# Patient Record
Sex: Female | Born: 1980 | Race: Black or African American | Hispanic: No | Smoking: Never smoker
Health system: Southern US, Community
[De-identification: ages and names within clinical notes are randomized; demographics above are authoritative.]

## PROBLEM LIST (undated history)

## (undated) DIAGNOSIS — R011 Cardiac murmur, unspecified: Secondary | ICD-10-CM

## (undated) DIAGNOSIS — Z87898 Personal history of other specified conditions: Secondary | ICD-10-CM

## (undated) DIAGNOSIS — R51 Headache: Secondary | ICD-10-CM

## (undated) DIAGNOSIS — D649 Anemia, unspecified: Secondary | ICD-10-CM

## (undated) DIAGNOSIS — Z8639 Personal history of other endocrine, nutritional and metabolic disease: Secondary | ICD-10-CM

## (undated) DIAGNOSIS — R7303 Prediabetes: Secondary | ICD-10-CM

## (undated) DIAGNOSIS — Z8709 Personal history of other diseases of the respiratory system: Secondary | ICD-10-CM

## (undated) DIAGNOSIS — E0500 Thyrotoxicosis with diffuse goiter without thyrotoxic crisis or storm: Secondary | ICD-10-CM

## (undated) HISTORY — DX: Prediabetes: R73.03

## (undated) HISTORY — PX: NO PAST SURGERIES: SHX2092

---

## 1998-02-28 ENCOUNTER — Other Ambulatory Visit: Admission: RE | Admit: 1998-02-28 | Discharge: 1998-02-28 | Payer: Self-pay | Admitting: Obstetrics

## 2000-08-13 ENCOUNTER — Emergency Department (HOSPITAL_COMMUNITY): Admission: EM | Admit: 2000-08-13 | Discharge: 2000-08-13 | Payer: Self-pay | Admitting: Emergency Medicine

## 2000-08-13 ENCOUNTER — Encounter: Payer: Self-pay | Admitting: Emergency Medicine

## 2007-09-27 ENCOUNTER — Inpatient Hospital Stay (HOSPITAL_COMMUNITY): Admission: AD | Admit: 2007-09-27 | Discharge: 2007-09-29 | Payer: Self-pay | Admitting: Obstetrics and Gynecology

## 2010-05-31 HISTORY — PX: WISDOM TOOTH EXTRACTION: SHX21

## 2011-02-23 LAB — CBC
HCT: 35.1 — ABNORMAL LOW
HCT: 37.2
Hemoglobin: 11.6 — ABNORMAL LOW
Hemoglobin: 12.1
MCHC: 32.5
MCHC: 33
MCV: 84.1
MCV: 84.1
Platelets: 215
Platelets: 228
RBC: 4.18
RBC: 4.43
RDW: 12.7
RDW: 13.2
WBC: 11.9 — ABNORMAL HIGH
WBC: 17.8 — ABNORMAL HIGH

## 2011-02-23 LAB — RPR: RPR Ser Ql: NONREACTIVE

## 2013-08-09 ENCOUNTER — Encounter (HOSPITAL_COMMUNITY): Payer: Self-pay | Admitting: Emergency Medicine

## 2013-08-09 ENCOUNTER — Emergency Department (HOSPITAL_COMMUNITY)
Admission: EM | Admit: 2013-08-09 | Discharge: 2013-08-09 | Disposition: A | Discharge status: Home / Self Care | Payer: Federal, State, Local not specified - PPO | Source: Home / Self Care | Attending: Family Medicine | Admitting: Family Medicine

## 2013-08-09 DIAGNOSIS — J40 Bronchitis, not specified as acute or chronic: Secondary | ICD-10-CM

## 2013-08-09 HISTORY — DX: Thyrotoxicosis with diffuse goiter without thyrotoxic crisis or storm: E05.00

## 2013-08-09 MED ORDER — AZITHROMYCIN 250 MG PO TABS: 250.0000 mg | ORAL_TABLET | Freq: Every day | ORAL | Status: DC

## 2013-08-09 NOTE — Discharge Instructions (Signed)

## 2013-08-09 NOTE — ED Provider Notes (Signed)
CSN: [id number]     Arrival date & time 08/09/13  1919 History   First MD Initiated Contact with Patient 08/09/13 1943     No chief complaint on file.  (Consider location/radiation/quality/duration/timing/severity/associated sxs/prior Treatment) Patient is a 33 y.o. female presenting with cough. The history is provided by the patient. No language interpreter was used.  Cough Cough characteristics:  Productive Sputum characteristics:  Green Severity:  Moderate Onset quality:  Gradual Duration:  10 days Timing:  Constant Progression:  Worsening Chronicity:  New Smoker: no   Relieved by:  Nothing Worsened by:  Nothing tried Associated symptoms: no fever     No past medical history on file. No past surgical history on file. No family history on file. History  Substance Use Topics  . Smoking status: Not on file  . Smokeless tobacco: Not on file  . Alcohol Use: Not on file   OB History   No data available     Review of Systems  Constitutional: Negative for fever.  Respiratory: Positive for cough.   All other systems reviewed and are negative.    Allergies  Review of patient's allergies indicates not on file.  Home Medications  No current outpatient prescriptions on file. BP 105/71  Pulse 96  Temp(Src) 98.4 F (36.9 C) (Oral)  Resp 18  SpO2 96% Physical Exam  Nursing note and vitals reviewed. Constitutional: She appears well-developed and well-nourished.  HENT:  Head: Normocephalic.  Right Ear: External ear normal.  Left Ear: External ear normal.  Nose: Nose normal.  Eyes: Pupils are equal, round, and reactive to light.  Neck: Normal range of motion. Neck supple.  Cardiovascular: Normal rate and normal heart sounds.   Pulmonary/Chest: Effort normal and breath sounds normal.  Abdominal: Soft.  Musculoskeletal: Normal range of motion.  Skin: Skin is warm.  Psychiatric: She has a normal mood and affect.    ED Course  Procedures (including critical care  time) Labs Review Labs Reviewed - No data to display Imaging Review No results found.   MDM   1. Bronchitis    zithromax    Fransico Meadow, PA-C 08/09/13 2000

## 2013-08-09 NOTE — ED Provider Notes (Signed)
Medical screening examination/treatment/procedure(s) were performed by resident physician or non-physician practitioner and as supervising physician I was immediately available for consultation/collaboration.   KINDL,JAMES DOUGLAS MD.   James D Kindl, MD 08/09/13 2047 

## 2013-08-09 NOTE — ED Notes (Signed)
Had aching all over, chills and low grade fever onset last Friday.  She vomited x 2 last week and twice today and once on Tuesday.  No diarrhea.  Cough continuous for 1 1/2 weeks.  Tried Robitussin and Mucinex and now [employer]

## 2013-09-17 ENCOUNTER — Ambulatory Visit (INDEPENDENT_AMBULATORY_CARE_PROVIDER_SITE_OTHER): Payer: Self-pay | Admitting: Surgery

## 2013-10-02 ENCOUNTER — Encounter (INDEPENDENT_AMBULATORY_CARE_PROVIDER_SITE_OTHER): Payer: Self-pay | Admitting: Surgery

## 2013-10-02 ENCOUNTER — Ambulatory Visit (INDEPENDENT_AMBULATORY_CARE_PROVIDER_SITE_OTHER): Payer: Federal, State, Local not specified - PPO | Admitting: Surgery

## 2013-10-02 VITALS — BP 130/78 | HR 75 | Temp 98.3°F | Resp 14 | Ht 66.0 in | Wt 157.0 lb

## 2013-10-02 DIAGNOSIS — E0590 Thyrotoxicosis, unspecified without thyrotoxic crisis or storm: Secondary | ICD-10-CM

## 2013-10-02 NOTE — Progress Notes (Signed)
[facility]  Chief Complaint  Patient presents with  . New Evaluation    hyperthyroidism - referral from Dr. Jacelyn Pi    HISTORY: The patient is a 33 year old female referred by her endocrinologist for hyperthyroidism. Patient was initially diagnosed with Graves' disease during her fourth pregnancy. She was treated with propylthiouracil. Patient was off medication for a time with borderline TSH levels. Recent TSH level however was markedly suppressed at 0.04. Patient is now taking methimazole.  Patient is has had symptoms of tremor, night sweats, and double vision. Other than laboratory studies, she has had no imaging studies performed.  Patient has discussed options for management with her endocrinologist. This includes treatment with radioactive iodine versus having total thyroidectomy. Patient is interested in the surgical option.  Patient has no prior history of thyroid disease. There is a family history of hypothyroidism in the patient's maternal grandmother. There is no history of thyroid malignancy. There is no family history of other endocrine neoplasms.  Past Medical History  Diagnosis Date  . Graves disease     Hyper thyroid    Current Outpatient Prescriptions  Medication Sig Dispense Refill  . methimazole (TAPAZOLE) 10 MG tablet Take 10 mg by mouth 3 (three) times daily.       No current facility-administered medications for this visit.    No Known Allergies  Family History  Problem Relation Age of Onset  . Cancer Mother     Peripheral T cell Lymphoma  . Diabetes Father     History   Social History  . Marital Status: Married    Spouse Name: N/A    Number of Children: N/A  . Years of Education: N/A   Social History Main Topics  . Smoking status: Never Smoker   . Smokeless tobacco: None  . Alcohol Use: No  . Drug Use: No  . Sexual Activity: Yes    Birth Control/ Protection: IUD   Other Topics Concern  . None    Social History Narrative  . None    REVIEW OF SYSTEMS - PERTINENT POSITIVES ONLY: Positive for tremor. Positive for double vision. Positive for night sweats. Denies compressive symptoms. Denies palpable masses. Denies pain. Denies palpitations.  EXAM: Filed Vitals:   10/02/13 1338  BP: 130/78  Pulse: 75  Temp: 98.3 F (36.8 C)  Resp: 14    GENERAL: well-developed, well-nourished, no acute distress HEENT: normocephalic; pupils equal and reactive; sclerae clear; dentition good; mucous membranes moist NECK:  No palpable masses in the thyroid bed; symmetric on extension; no palpable anterior or posterior cervical lymphadenopathy; no supraclavicular masses; no tenderness CHEST: clear to auscultation bilaterally without rales, rhonchi, or wheezes CARDIAC: regular rate and rhythm without significant murmur; peripheral pulses are full EXT:  non-tender without edema; no deformity NEURO: no gross focal deficits; no sign of tremor   LABORATORY RESULTS: See [facility] (CHL-Epic) for most recent results  RADIOLOGY RESULTS: See [facility] (CHL-Epic) for most recent results  IMPRESSION: Graves' disease (hyperthyroidism)  PLAN: I discussed the above findings at length with the patient. We reviewed records from her endocrinologist. We discussed management of hyperthyroidism and her options for treatment between radioactive iodine and total thyroidectomy. I provided her with written literature regarding thyroid surgery. We discussed total thyroidectomy including the risk and benefits including the risk of bleeding, infection, recurrent laryngeal nerve injury, and injury to parathyroid glands. We discussed the hospital stay to be anticipated. We discussed the postoperative recovery and return to  work. We discussed the need for lifelong thyroid hormone replacement. Patient understands and wishes to proceed.  I would like to obtain a thyroid ultrasound preoperatively. We will also make  arrangements for assessment by anesthesiology.  The risks and benefits of the procedure have been discussed at length with the patient.  The patient understands the proposed procedure, potential alternative treatments, and the course of recovery to be expected.  All of the patient's questions have been answered at this time.  The patient wishes to proceed with surgery.  Earnstine Regal, MD, [facility]  Primary Care Physician: Antony Blackbird, MD

## 2013-10-02 NOTE — Patient Instructions (Signed)

## 2013-10-03 ENCOUNTER — Telehealth (INDEPENDENT_AMBULATORY_CARE_PROVIDER_SITE_OTHER): Payer: Self-pay | Admitting: *Deleted

## 2013-10-03 NOTE — Telephone Encounter (Signed)
LM for pt to return my call.  Please inform pt of her US Soft Tissue Head/Neck appt @ [place], [facility], 5.8.15 @ 1:45.    Anderson Malta

## 2013-10-05 ENCOUNTER — Encounter (INDEPENDENT_AMBULATORY_CARE_PROVIDER_SITE_OTHER): Payer: Self-pay

## 2013-10-05 ENCOUNTER — Other Ambulatory Visit: Payer: Federal, State, Local not specified - PPO

## 2013-10-05 ENCOUNTER — Ambulatory Visit
Admission: RE | Admit: 2013-10-05 | Discharge: 2013-10-05 | Disposition: A | Discharge status: Home / Self Care | Payer: Federal, State, Local not specified - PPO | Source: Ambulatory Visit | Attending: Surgery | Admitting: Surgery

## 2013-10-05 DIAGNOSIS — E0590 Thyrotoxicosis, unspecified without thyrotoxic crisis or storm: Secondary | ICD-10-CM

## 2013-10-05 IMAGING — US US SOFT TISSUE HEAD/NECK
1 series · 14 of 25 positions shown · non-contrast
Comparison: None.

CLINICAL DATA: Hyperthyroid.  Graves disease.

EXAM:
THYROID ULTRASOUND
TECHNIQUE: Ultrasound examination of the thyroid gland and adjacent soft
tissues was performed.

[Series 1: us soft tissue head/neck · 0.08mm/px · 14 of 56 slices shown]
[im 1/56]
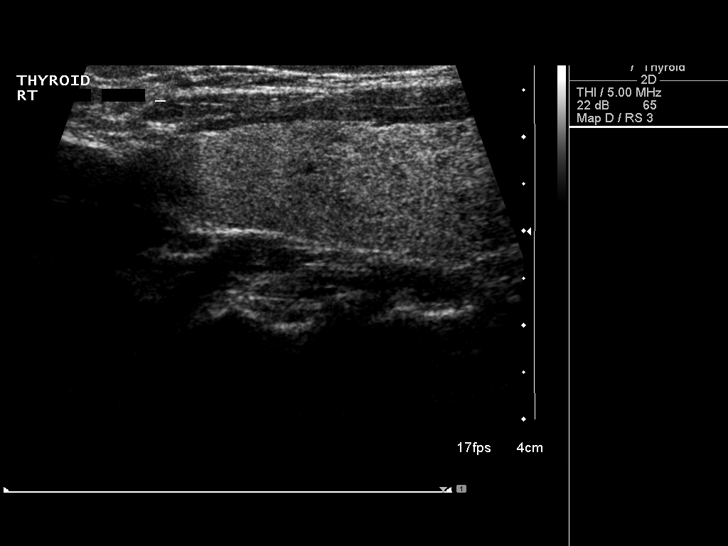
[im 5/56]
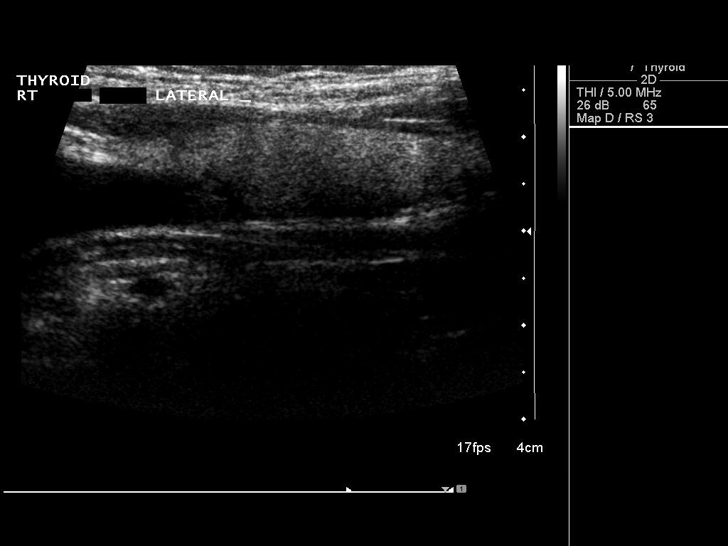
[im 10/56]
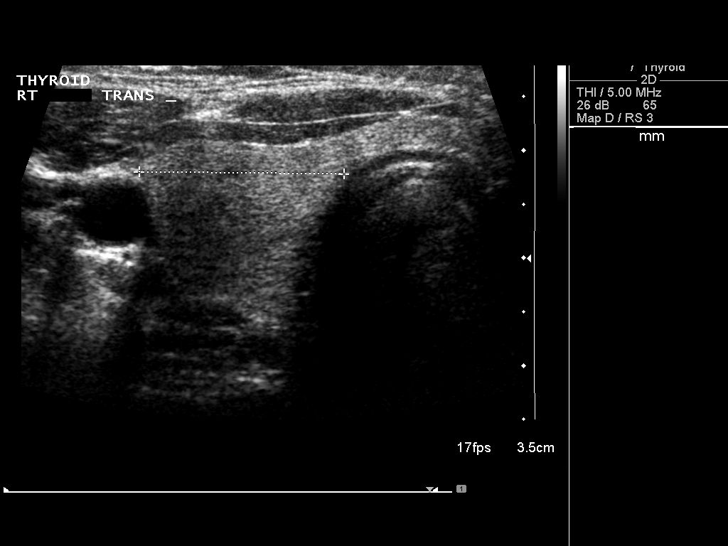
[im 14/56]
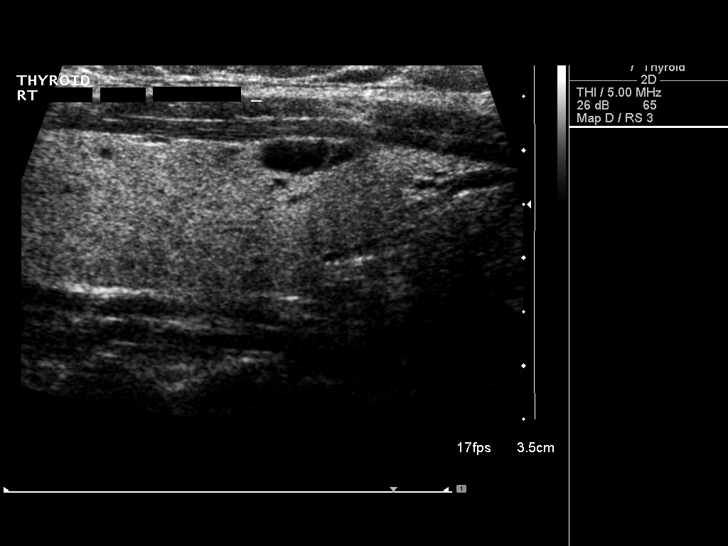
[im 19/56]
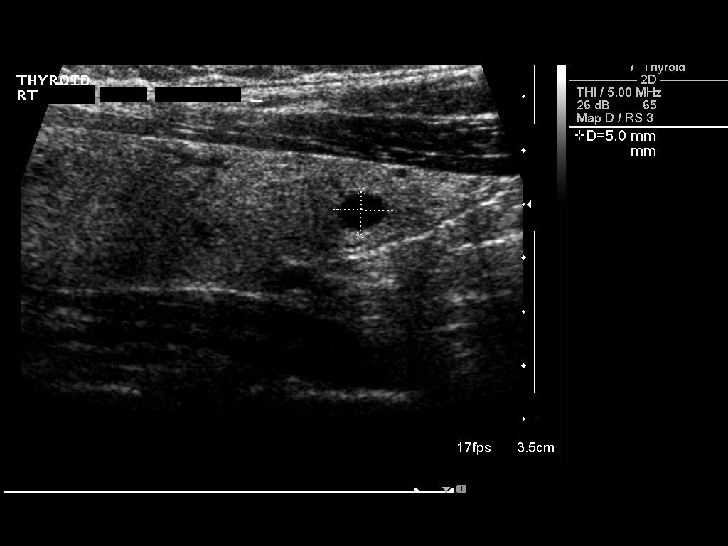
[im 21/56]
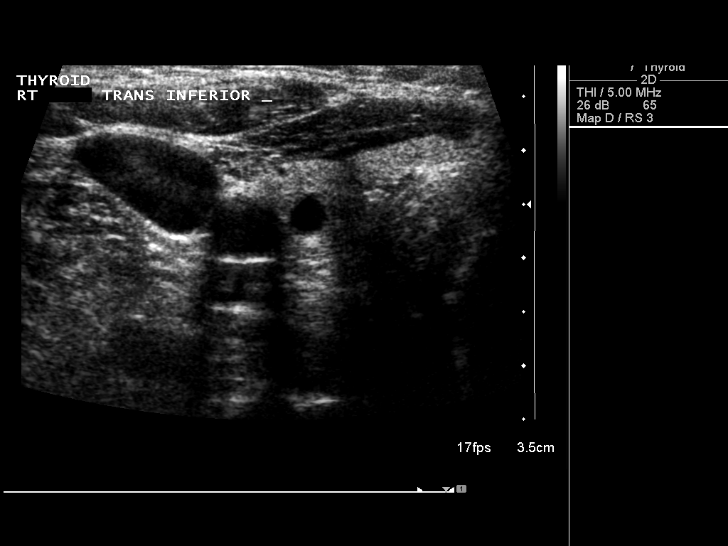
[im 26/56]
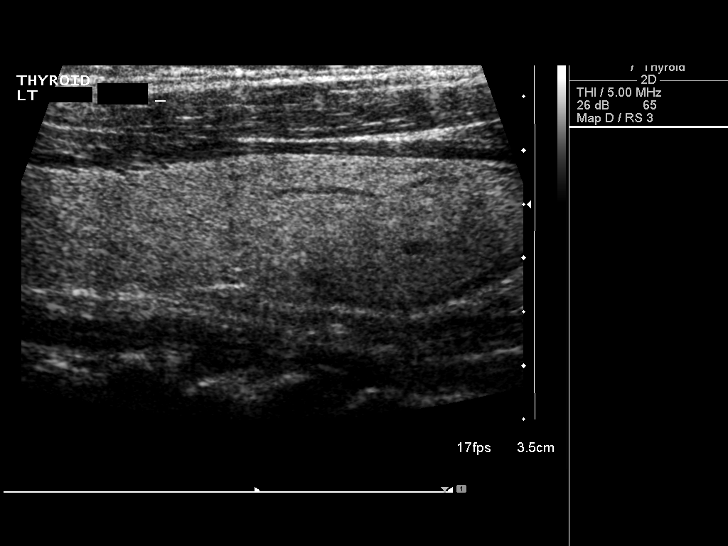
[im 30/56]
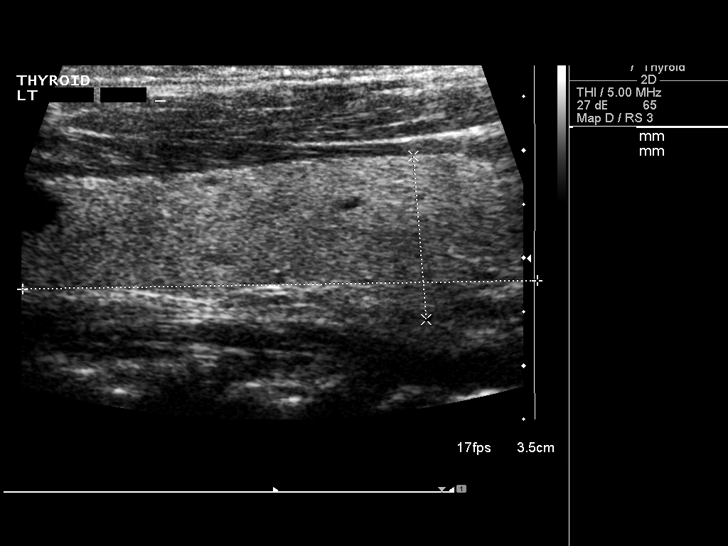
[im 35/56]
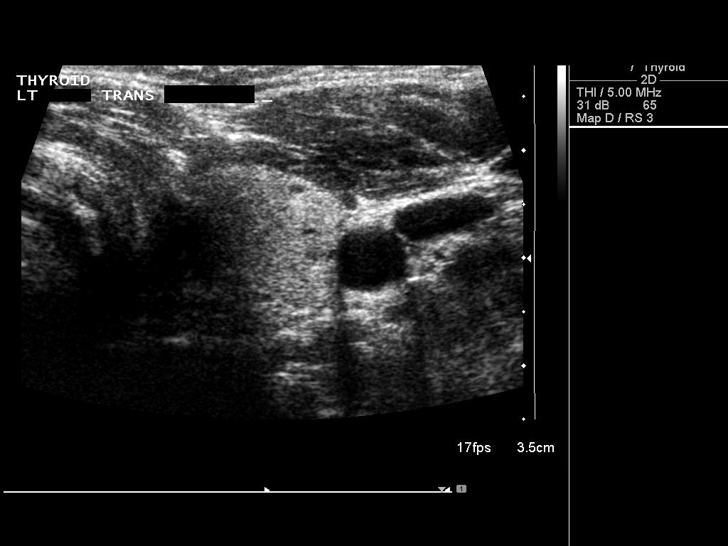
[im 37/56]
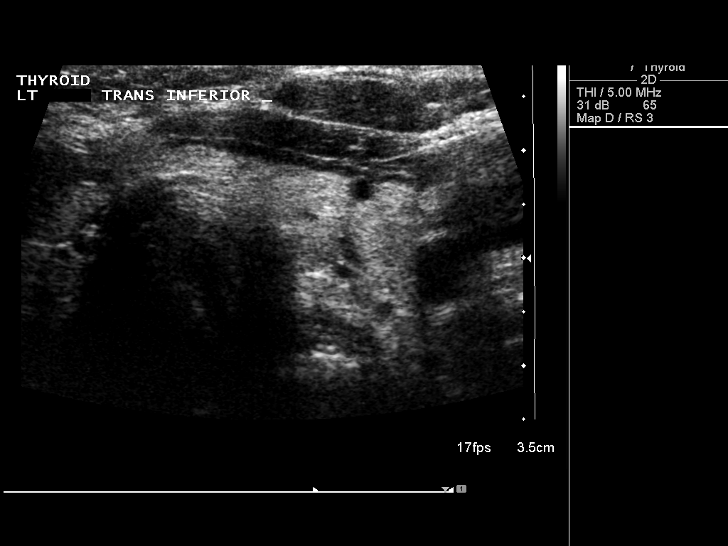
[im 42/56]
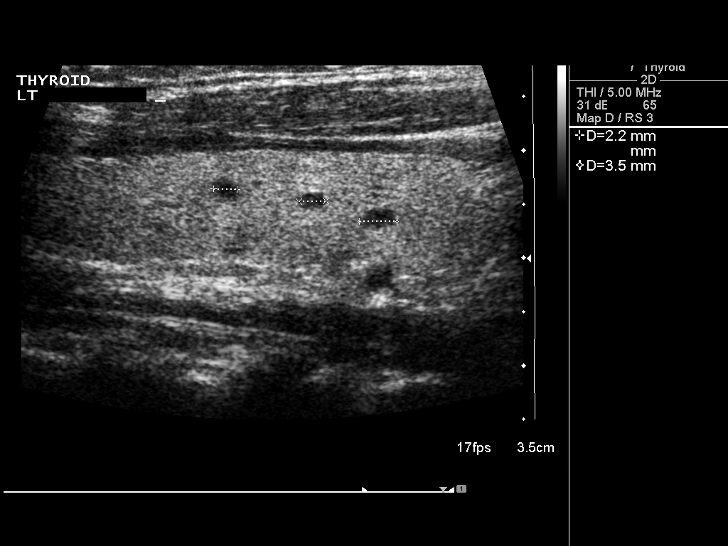
[im 46/56]
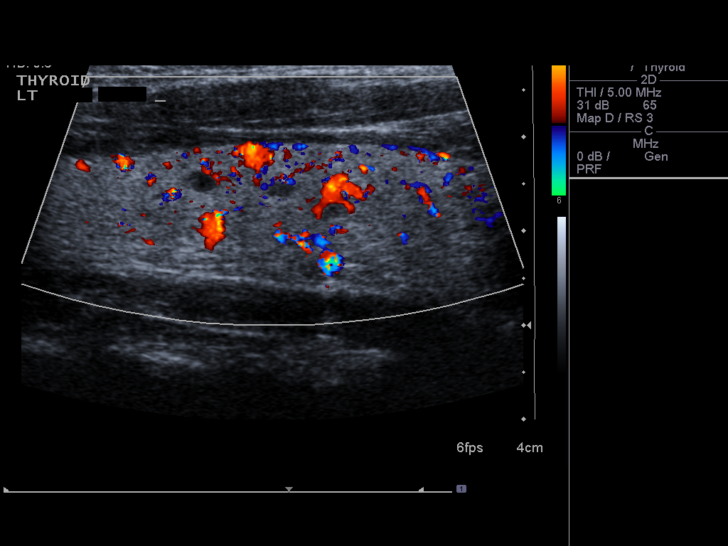
[im 51/56]
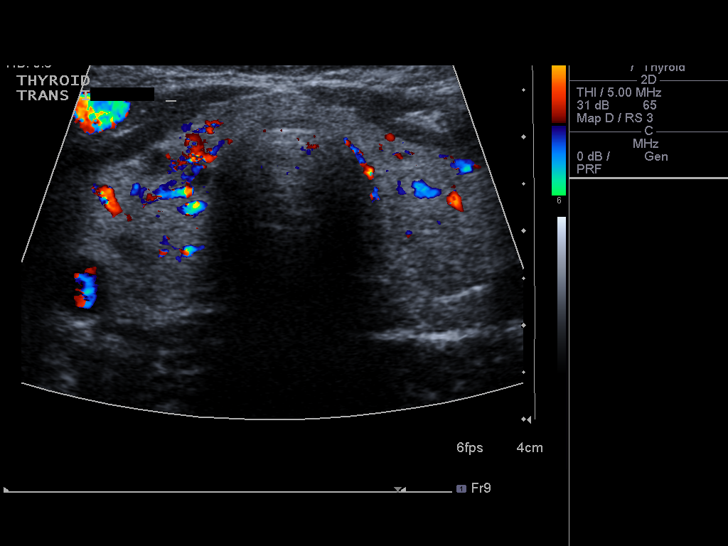
[im 56/56]
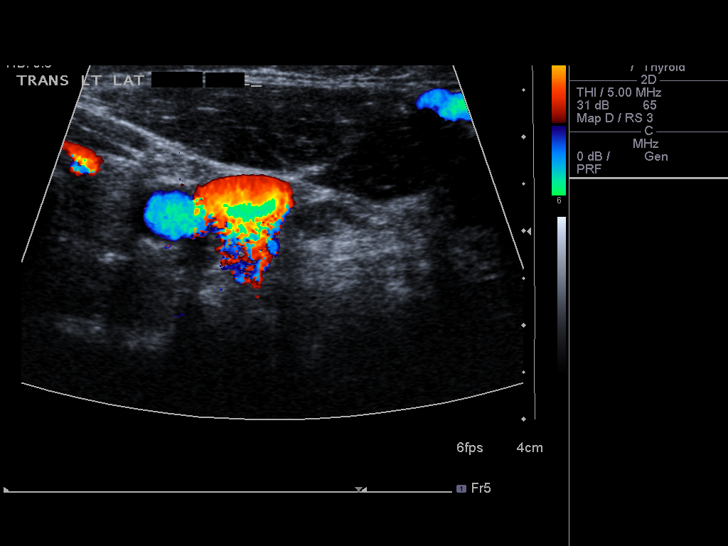

[14 of 25 positions shown; findings below may reference images not displayed]

FINDINGS: Right thyroid lobe

Measurements: 64 x 15 x 19 mm. Heterogeneous hyperemic echotexture
with multiple small cystic lesions, largest 7 x 4 x 4 mm in the
lower pole.

Left thyroid lobe

Measurements: 62 x 13 x 21 mm. Mild hyperemia with multiple small
cystic lesions, largest 6 x 3 x 4 mm in the upper pole.

Isthmus

Thickness: 3 mm.  No nodules visualized.

Lymphadenopathy

None visualized.
IMPRESSION: Thyromegaly with hyperemia and multiple small cysts. Findings do not
meet current consensus criteria for biopsy. Follow-up by clinical
exam is recommended. If patient has known risk factors for thyroid
carcinoma, consider follow-up ultrasound in 12 months. If patient is
clinically hyperthyroid, consider nuclear medicine thyroid uptake
and scan. This recommendation follows the consensus statement:
Management of Thyroid Nodules Detected as US: Society of
Radiologists in Ultrasound Consensus Conference Statement. Radiology

## 2013-11-01 ENCOUNTER — Encounter (HOSPITAL_COMMUNITY): Payer: Self-pay | Admitting: Pharmacy Technician

## 2013-11-08 ENCOUNTER — Ambulatory Visit (HOSPITAL_COMMUNITY)
Admission: RE | Admit: 2013-11-08 | Discharge: 2013-11-08 | Disposition: A | Discharge status: Home / Self Care | Payer: Federal, State, Local not specified - PPO | Source: Ambulatory Visit | Attending: Anesthesiology | Admitting: Anesthesiology

## 2013-11-08 ENCOUNTER — Encounter (HOSPITAL_COMMUNITY)
Admission: RE | Admit: 2013-11-08 | Discharge: 2013-11-08 | Disposition: A | Discharge status: Home / Self Care | Payer: Federal, State, Local not specified - PPO | Source: Ambulatory Visit | Attending: Surgery | Admitting: Surgery

## 2013-11-08 ENCOUNTER — Encounter (HOSPITAL_COMMUNITY): Payer: Self-pay

## 2013-11-08 DIAGNOSIS — Z01812 Encounter for preprocedural laboratory examination: Secondary | ICD-10-CM | POA: Insufficient documentation

## 2013-11-08 DIAGNOSIS — Z01818 Encounter for other preprocedural examination: Secondary | ICD-10-CM | POA: Insufficient documentation

## 2013-11-08 HISTORY — DX: Personal history of other diseases of the respiratory system: Z87.09

## 2013-11-08 HISTORY — DX: Headache: R51

## 2013-11-08 HISTORY — DX: Anemia, unspecified: D64.9

## 2013-11-08 HISTORY — DX: Cardiac murmur, unspecified: R01.1

## 2013-11-08 LAB — CBC
HEMATOCRIT: 39.8 % (ref 36.0–46.0)
Hemoglobin: 12.9 g/dL (ref 12.0–15.0)
MCH: 26.4 pg (ref 26.0–34.0)
MCHC: 32.4 g/dL (ref 30.0–36.0)
MCV: 81.4 fL (ref 78.0–100.0)
Platelets: 344 10*3/uL (ref 150–400)
RBC: 4.89 MIL/uL (ref 3.87–5.11)
RDW: 12.3 % (ref 11.5–15.5)
WBC: 5.5 10*3/uL (ref 4.0–10.5)

## 2013-11-08 LAB — BASIC METABOLIC PANEL
BUN: 10 mg/dL (ref 6–23)
CHLORIDE: 104 meq/L (ref 96–112)
CO2: 26 meq/L (ref 19–32)
Calcium: 9.6 mg/dL (ref 8.4–10.5)
Creatinine, Ser: 0.67 mg/dL (ref 0.50–1.10)
GFR calc non Af Amer: >90 mL/min (ref >90)
Glucose, Bld: 104 mg/dL — ABNORMAL HIGH (ref 70–99)
POTASSIUM: 4 meq/L (ref 3.7–5.3)
Sodium: 142 mEq/L (ref 137–147)

## 2013-11-08 LAB — HCG, SERUM, QUALITATIVE: PREG SERUM: NEGATIVE

## 2013-11-08 IMAGING — CR DG CHEST 2V
2 series · 2 of 2 positions shown · non-contrast
Comparison: None.

CLINICAL DATA: Preoperative thyroidectomy

EXAM:
CHEST  2 VIEW

[w chest pa]
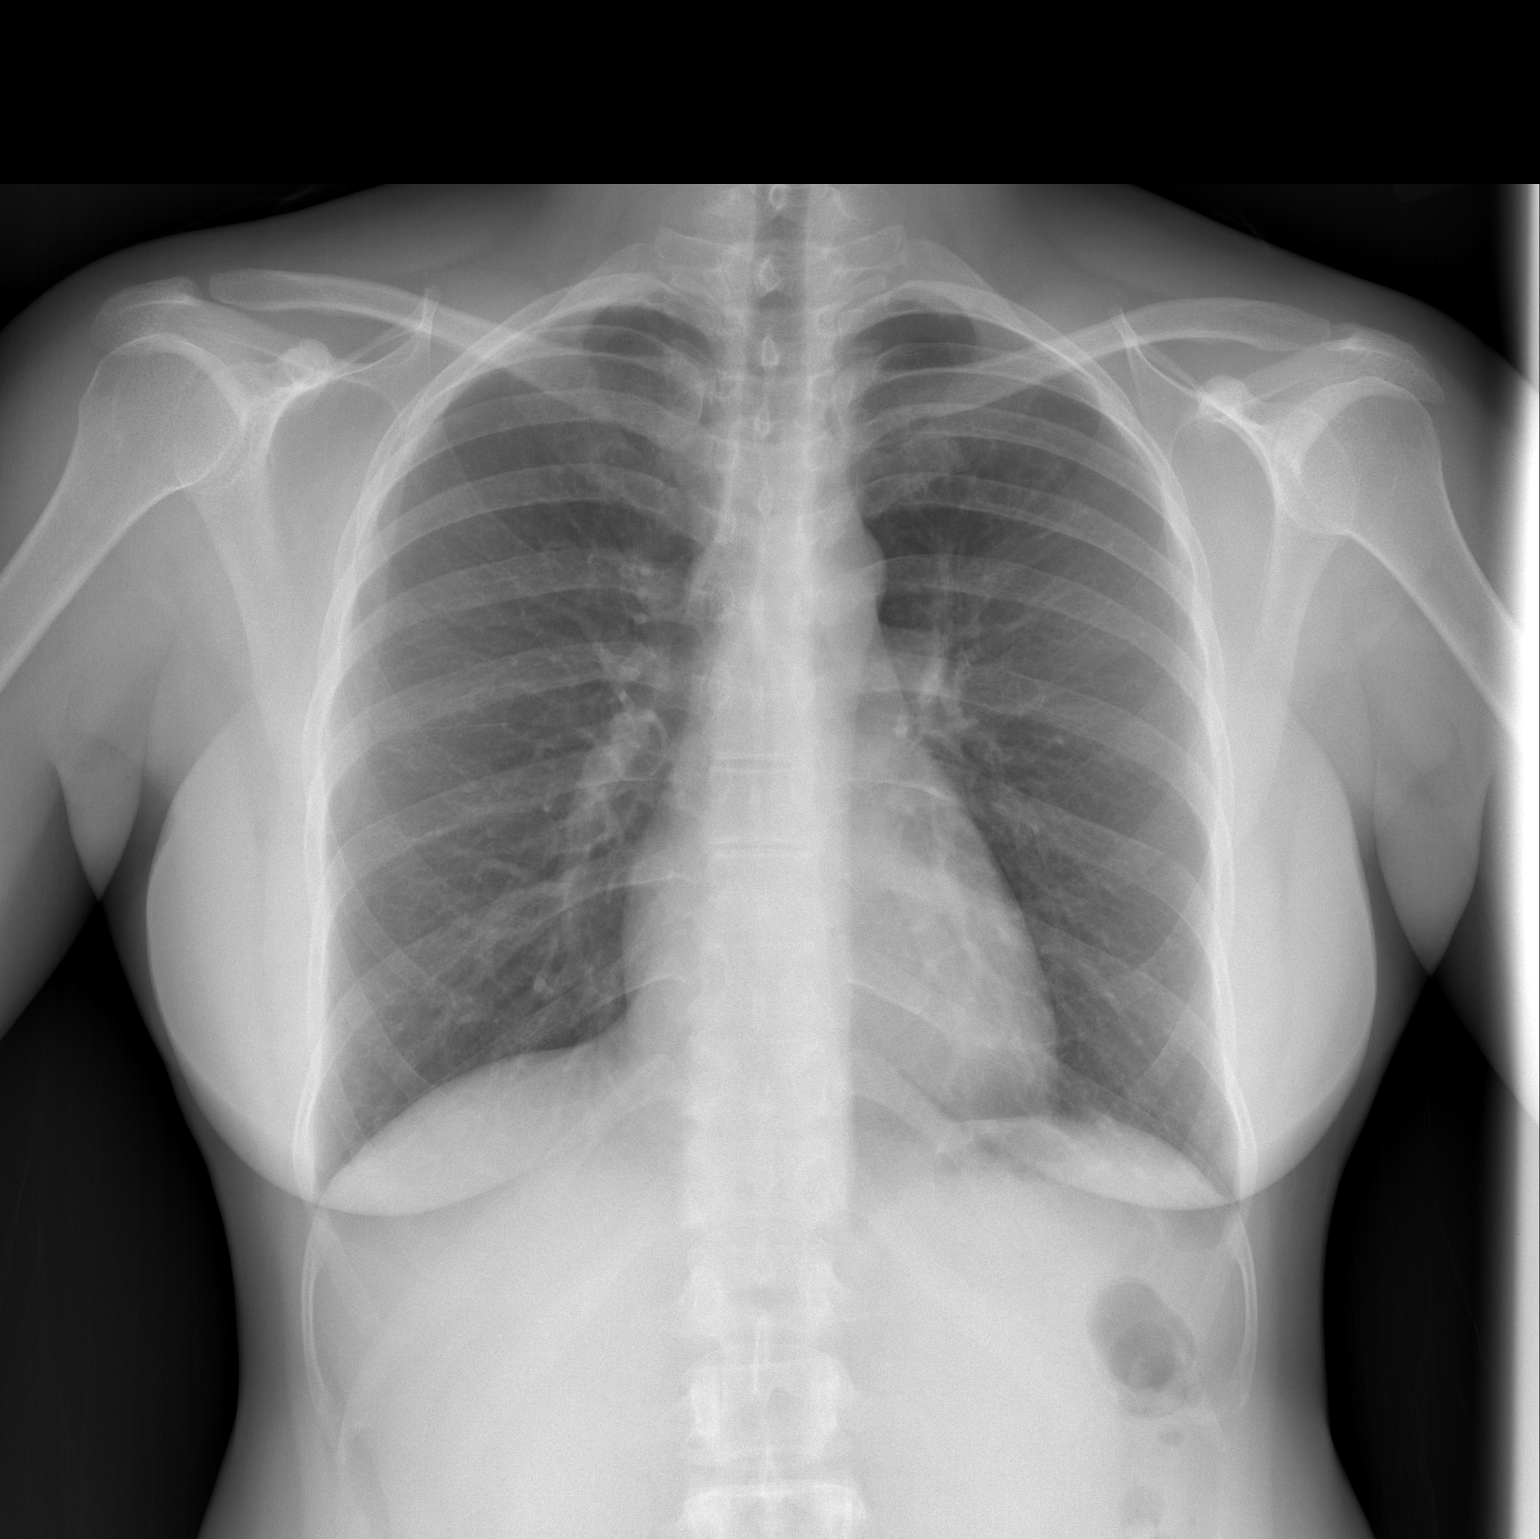

[w chest lat]
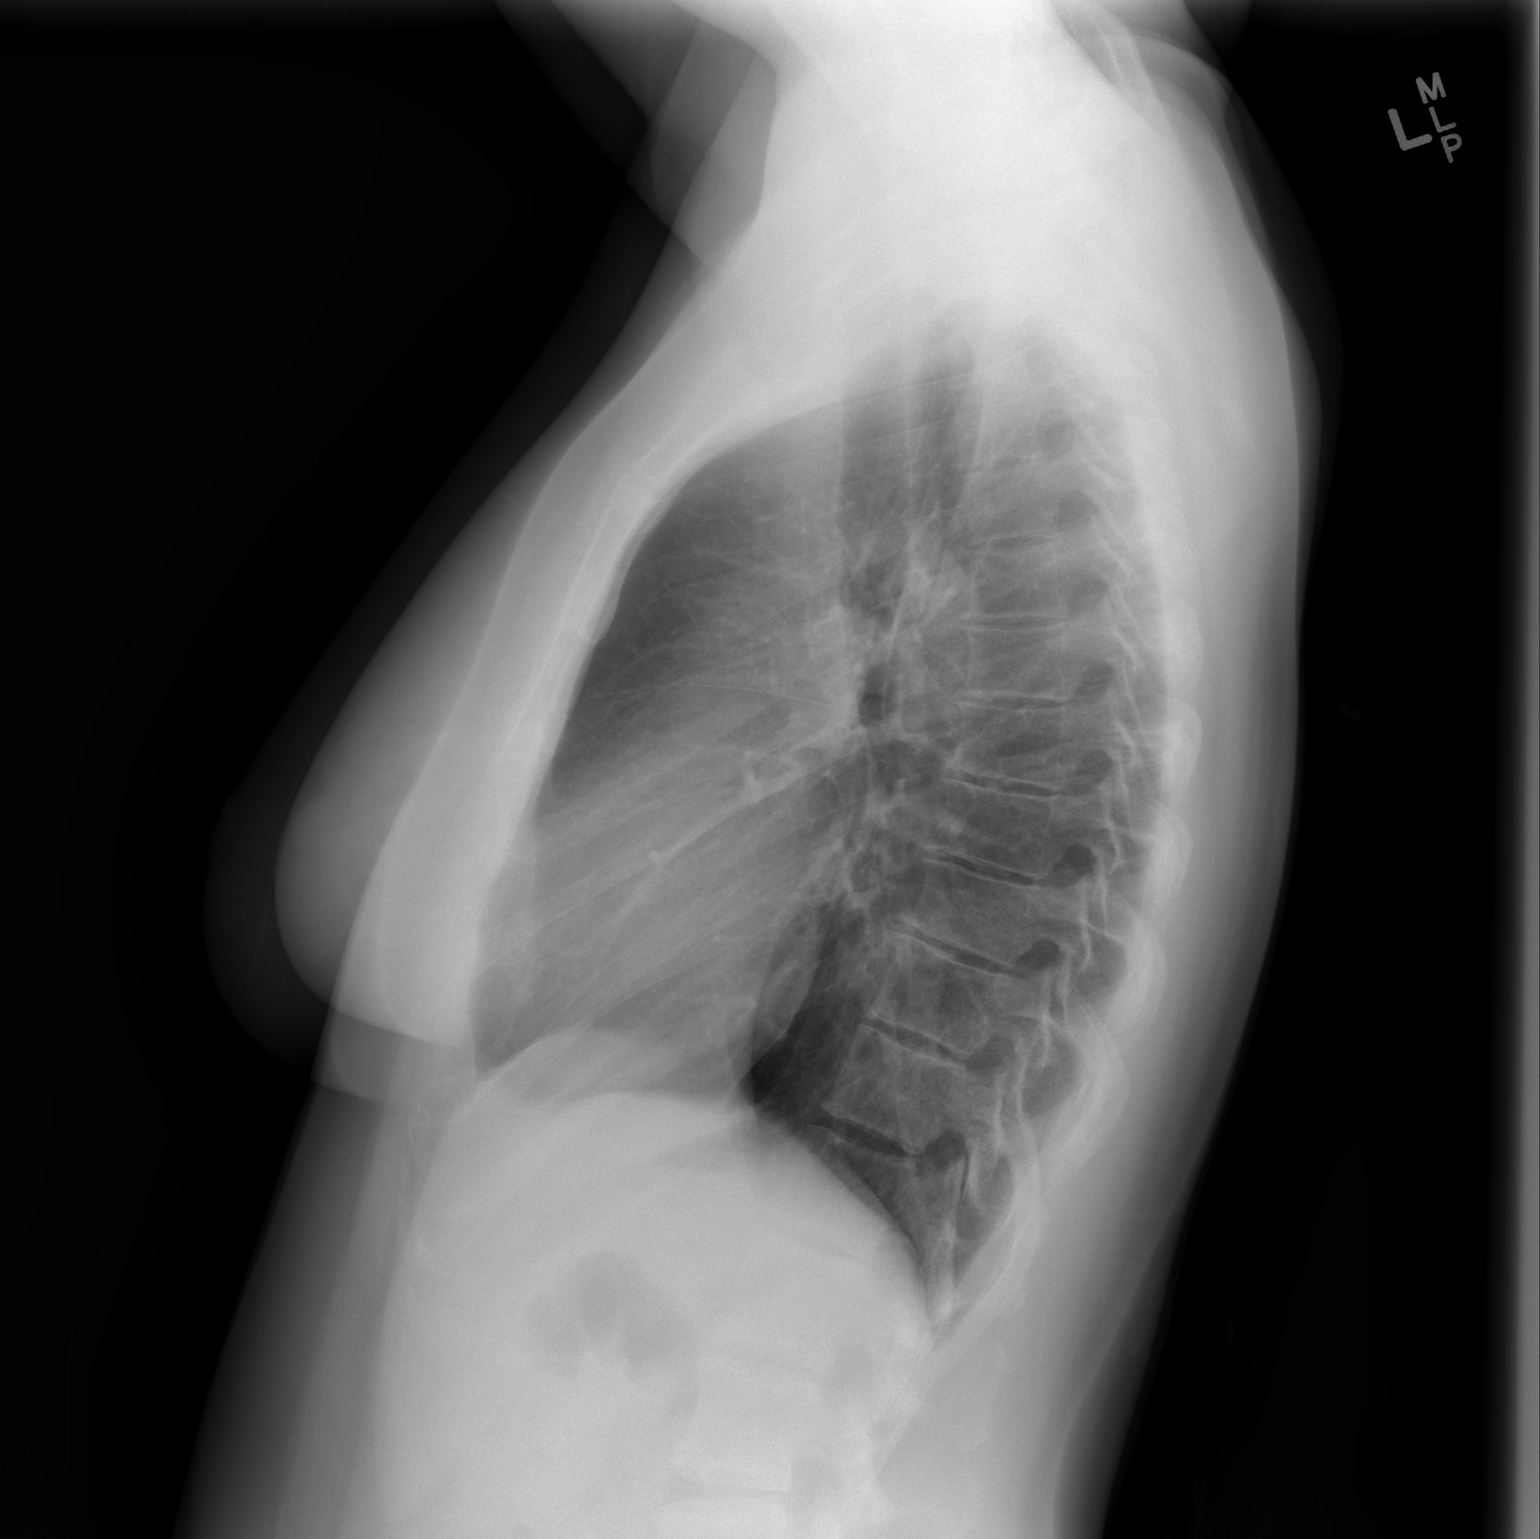

[2 of 2 positions shown; findings below may reference images not displayed]

FINDINGS: Lungs are clear. Heart size and pulmonary vascularity are normal. No
adenopathy. No bone lesions. Trachea is midline.
IMPRESSION: No abnormality noted.

## 2013-11-08 NOTE — Progress Notes (Signed)
Quick Note:  Pre-operative chest x-ray is acceptable for scheduled surgery.  Greyson Riccardi M. Nakiesha Rumsey, MD, [facility] Office: [phone]   ______ 

## 2013-11-08 NOTE — Progress Notes (Signed)
Quick Note:  Pre-operative chest x-ray is acceptable for scheduled surgery.  Raphael Espe M. Wendee Hata, MD, FACS [facility] Office: [phone]   ______ 

## 2013-11-08 NOTE — Patient Instructions (Signed)
[name] [name]  11/08/2013   Your procedure is scheduled on: Thursday 11/15/13  Report to [facility] at 05:30 AM.  Call this number if you have problems the morning of surgery 336-: [phone]   Remember:   Do not eat food or drink liquids After Midnight.     Take these medicines the morning of surgery with A SIP OF WATER: tapazole   Do not wear jewelry, make-up or nail polish.  Do not wear lotions, powders, or perfumes. You may wear deodorant.  Do not shave 48 hours prior to surgery. Men may shave face and neck.  Do not bring valuables to the hospital.  Contacts, dentures or bridgework may not be worn into surgery.  Leave suitcase in the car. After surgery it may be brought to your room.  For patients admitted to the hospital, checkout time is 11:00 AM the day of discharge.  [name], RN  pre op nurse call if needed [phone]    [facility] - Preparing for Surgery Before surgery, you can play an important role.  Because skin is not sterile, your skin needs to be as free of germs as possible.  You can reduce the number of germs on your skin by washing with CHG (chlorahexidine gluconate) soap before surgery.  CHG is an antiseptic cleaner which kills germs and bonds with the skin to continue killing germs even after washing. Please DO NOT use if you have an allergy to CHG or antibacterial soaps.  If your skin becomes reddened/irritated stop using the CHG and inform your nurse when you arrive at Short Stay. Do not shave (including legs and underarms) for at least 48 hours prior to the first CHG shower.  You may shave your face/neck. Please follow these instructions carefully:  1.  Shower with CHG Soap the night before surgery and the  morning of Surgery.  2.  If you choose to wash your hair, wash your hair first as usual with your  normal  shampoo.  3.  After you shampoo, rinse your hair and body thoroughly to remove the  shampoo.                            4.   Use CHG as you would any other liquid soap.  You can apply chg directly  to the skin and wash                       Gently with a scrungie or clean washcloth.  5.  Apply the CHG Soap to your body ONLY FROM THE NECK DOWN.   Do not use on face/ open                           Wound or open sores. Avoid contact with eyes, ears mouth and genitals (private parts).                       Wash face,  Genitals (private parts) with your normal soap.             6.  Wash thoroughly, paying special attention to the area where your surgery  will be performed.  7.  Thoroughly rinse your body with warm water from the neck down.  8.  DO NOT shower/wash with your normal soap after using and rinsing off  the CHG  Soap.                9.  Pat yourself dry with a clean towel.            10.  Wear clean pajamas.            11.  Place clean sheets on your bed the night of your first shower and do not  sleep with pets. Day of Surgery : Do not apply any lotions/deodorants the morning of surgery.  Please wear clean clothes to the hospital/surgery center.  FAILURE TO FOLLOW THESE INSTRUCTIONS MAY RESULT IN THE CANCELLATION OF YOUR SURGERY PATIENT SIGNATURE_________________________________  NURSE SIGNATURE__________________________________  ________________________________________________________________________

## 2013-11-08 NOTE — Progress Notes (Signed)
Quick Note:  These results are acceptable for scheduled surgery.  Katheryne Gorr M. Brett Soza, MD, FACS [facility] Office: [phone]   ______ 

## 2013-11-08 NOTE — Progress Notes (Signed)
Quick Note:  These results are acceptable for scheduled surgery.  Nichlos Kunzler M. Maye Parkinson, MD, [facility] Office: [phone]   ______ 

## 2013-11-13 ENCOUNTER — Encounter (INDEPENDENT_AMBULATORY_CARE_PROVIDER_SITE_OTHER): Payer: Self-pay

## 2013-11-14 NOTE — Anesthesia Preprocedure Evaluation (Addendum)
Anesthesia Evaluation  Patient identified by MRN, date of birth, ID band Patient awake    Reviewed: Allergy & Precautions, H&P , NPO status , Patient's Chart, lab work & pertinent test results  Airway Mallampati: II TM Distance: >3 FB Neck ROM: Full    Dental  (+) Teeth Intact, Dental Advisory Given   Pulmonary neg pulmonary ROS,  breath sounds clear to auscultation  Pulmonary exam normal       Cardiovascular negative cardio ROS  Rhythm:Regular Rate:Normal     Neuro/Psych  Headaches, negative neurological ROS  negative psych ROS   GI/Hepatic negative GI ROS, Neg liver ROS,   Endo/Other  Hyperthyroidism   Renal/GU negative Renal ROS  negative genitourinary   Musculoskeletal negative musculoskeletal ROS (+)   Abdominal   Peds  Hematology  (+) anemia ,   Anesthesia Other Findings   Reproductive/Obstetrics negative OB ROS                          Anesthesia Physical Anesthesia Plan  ASA: II  Anesthesia Plan: General   Post-op Pain Management:    Induction: Intravenous  Airway Management Planned: Oral ETT  Additional Equipment:   Intra-op Plan:   Post-operative Plan: Extubation in OR  Informed Consent: I have reviewed the patients History and Physical, chart, labs and discussed the procedure including the risks, benefits and alternatives for the proposed anesthesia with the patient or authorized representative who has indicated his/her understanding and acceptance.   Dental advisory given  Plan Discussed with: CRNA  Anesthesia Plan Comments:         Anesthesia Quick Evaluation

## 2013-11-15 ENCOUNTER — Encounter (HOSPITAL_COMMUNITY): Payer: Federal, State, Local not specified - PPO | Admitting: Anesthesiology

## 2013-11-15 ENCOUNTER — Encounter (HOSPITAL_COMMUNITY): Payer: Self-pay | Admitting: *Deleted

## 2013-11-15 ENCOUNTER — Ambulatory Visit (HOSPITAL_COMMUNITY)
Admission: RE | Admit: 2013-11-15 | Discharge: 2013-11-16 | Disposition: A | Discharge status: Home / Self Care | Payer: Federal, State, Local not specified - PPO | Source: Ambulatory Visit | Attending: Surgery | Admitting: Surgery

## 2013-11-15 ENCOUNTER — Ambulatory Visit (HOSPITAL_COMMUNITY): Payer: Federal, State, Local not specified - PPO | Admitting: Anesthesiology

## 2013-11-15 ENCOUNTER — Encounter (HOSPITAL_COMMUNITY)
Admission: RE | Disposition: A | Discharge status: Home / Self Care | Payer: Self-pay | Source: Ambulatory Visit | Attending: Surgery

## 2013-11-15 DIAGNOSIS — E0500 Thyrotoxicosis with diffuse goiter without thyrotoxic crisis or storm: Secondary | ICD-10-CM

## 2013-11-15 DIAGNOSIS — E0590 Thyrotoxicosis, unspecified without thyrotoxic crisis or storm: Secondary | ICD-10-CM

## 2013-11-15 DIAGNOSIS — Z79899 Other long term (current) drug therapy: Secondary | ICD-10-CM | POA: Insufficient documentation

## 2013-11-15 DIAGNOSIS — D649 Anemia, unspecified: Secondary | ICD-10-CM | POA: Insufficient documentation

## 2013-11-15 DIAGNOSIS — E890 Postprocedural hypothyroidism: Secondary | ICD-10-CM

## 2013-11-15 HISTORY — PX: THYROIDECTOMY: SHX17

## 2013-11-15 HISTORY — DX: Postprocedural hypothyroidism: E89.0

## 2013-11-15 SURGERY — THYROIDECTOMY
Anesthesia: General | Site: Neck | Laterality: Bilateral

## 2013-11-15 MED ORDER — CISATRACURIUM BESYLATE (PF) 10 MG/5ML IV SOLN
INTRAVENOUS | Status: DC | PRN
  Administered 2013-11-15: 5 mg via INTRAVENOUS
  Administered 2013-11-15: 2 mg via INTRAVENOUS

## 2013-11-15 MED ORDER — PROPOFOL 10 MG/ML IV BOLUS
INTRAVENOUS | Status: DC | PRN
  Administered 2013-11-15: 175 mg via INTRAVENOUS

## 2013-11-15 MED ORDER — LACTATED RINGERS IV SOLN
INTRAVENOUS | Status: DC | PRN
  Administered 2013-11-15: 07:00:00 via INTRAVENOUS

## 2013-11-15 MED ORDER — DEXAMETHASONE SODIUM PHOSPHATE 10 MG/ML IJ SOLN: INTRAMUSCULAR | Status: DC | PRN

## 2013-11-15 MED ORDER — HYDROMORPHONE HCL PF 1 MG/ML IJ SOLN
0.2500 mg | INTRAMUSCULAR | Status: DC | PRN
  Administered 2013-11-15 (×4): 0.5 mg via INTRAVENOUS

## 2013-11-15 MED ORDER — GLYCOPYRROLATE 0.2 MG/ML IJ SOLN
INTRAMUSCULAR | Status: DC | PRN
  Administered 2013-11-15: 0.4 mg via INTRAVENOUS

## 2013-11-15 MED ORDER — HYDROMORPHONE HCL PF 1 MG/ML IJ SOLN
1.0000 mg | INTRAMUSCULAR | Status: DC | PRN
  Administered 2013-11-15: 1 mg via INTRAVENOUS
  Filled 2013-11-15: qty 1

## 2013-11-15 MED ORDER — ACETAMINOPHEN 325 MG PO TABS: 650.0000 mg | ORAL_TABLET | ORAL | Status: DC | PRN

## 2013-11-15 MED ORDER — HYDROMORPHONE HCL PF 1 MG/ML IJ SOLN
INTRAMUSCULAR | Status: AC
  Filled 2013-11-15: qty 1

## 2013-11-15 MED ORDER — KCL IN DEXTROSE-NACL 20-5-0.45 MEQ/L-%-% IV SOLN
INTRAVENOUS | Status: DC
  Administered 2013-11-15: 13:00:00 via INTRAVENOUS
  Filled 2013-11-15 (×2): qty 1000

## 2013-11-15 MED ORDER — FENTANYL CITRATE 0.05 MG/ML IJ SOLN
INTRAMUSCULAR | Status: DC | PRN
  Administered 2013-11-15: 25 ug via INTRAVENOUS
  Administered 2013-11-15: 50 ug via INTRAVENOUS
  Administered 2013-11-15 (×2): 25 ug via INTRAVENOUS
  Administered 2013-11-15: 50 ug via INTRAVENOUS
  Administered 2013-11-15: 25 ug via INTRAVENOUS

## 2013-11-15 MED ORDER — ONDANSETRON HCL 4 MG/2ML IJ SOLN
4.0000 mg | Freq: Four times a day (QID) | INTRAMUSCULAR | Status: DC | PRN
  Administered 2013-11-15: 4 mg via INTRAVENOUS
  Filled 2013-11-15: qty 2

## 2013-11-15 MED ORDER — DEXAMETHASONE SODIUM PHOSPHATE 4 MG/ML IJ SOLN
INTRAMUSCULAR | Status: DC | PRN
  Administered 2013-11-15: 10 mg via INTRAVENOUS

## 2013-11-15 MED ORDER — KETAMINE HCL 10 MG/ML IJ SOLN
INTRAMUSCULAR | Status: DC | PRN
  Administered 2013-11-15 (×3): 5 mg via INTRAVENOUS

## 2013-11-15 MED ORDER — CALCIUM CARBONATE 1250 (500 CA) MG PO TABS
2.0000 | ORAL_TABLET | Freq: Three times a day (TID) | ORAL | Status: DC
  Administered 2013-11-15 – 2013-11-16 (×3): 1000 mg via ORAL
  Filled 2013-11-15 (×6): qty 2

## 2013-11-15 MED ORDER — ONDANSETRON HCL 4 MG/2ML IJ SOLN
INTRAMUSCULAR | Status: DC | PRN
  Administered 2013-11-15 (×2): 2 mg via INTRAVENOUS

## 2013-11-15 MED ORDER — ONDANSETRON HCL 4 MG PO TABS: 4.0000 mg | ORAL_TABLET | Freq: Four times a day (QID) | ORAL | Status: DC | PRN

## 2013-11-15 MED ORDER — NEOSTIGMINE METHYLSULFATE 10 MG/10ML IV SOLN
INTRAVENOUS | Status: DC | PRN
  Administered 2013-11-15: 3 mg via INTRAVENOUS

## 2013-11-15 MED ORDER — MIDAZOLAM HCL 5 MG/5ML IJ SOLN
INTRAMUSCULAR | Status: DC | PRN
  Administered 2013-11-15: 1 mg via INTRAVENOUS

## 2013-11-15 MED ORDER — CEFAZOLIN SODIUM-DEXTROSE 2-3 GM-% IV SOLR
2.0000 g | Freq: Once | INTRAVENOUS | Status: AC
  Administered 2013-11-15: 2 g via INTRAVENOUS

## 2013-11-15 MED ORDER — LIDOCAINE HCL (CARDIAC) 20 MG/ML IV SOLN
INTRAVENOUS | Status: DC | PRN
  Administered 2013-11-15: 30 mg via INTRAVENOUS

## 2013-11-15 MED ORDER — PROMETHAZINE HCL 25 MG/ML IJ SOLN: 6.2500 mg | INTRAMUSCULAR | Status: DC | PRN

## 2013-11-15 MED ORDER — HYDROCODONE-ACETAMINOPHEN 5-325 MG PO TABS
1.0000 | ORAL_TABLET | ORAL | Status: DC | PRN
  Administered 2013-11-15 – 2013-11-16 (×2): 2 via ORAL
  Filled 2013-11-15 (×2): qty 2

## 2013-11-15 MED ORDER — LACTATED RINGERS IV SOLN
INTRAVENOUS | Status: DC
  Administered 2013-11-15: 10:00:00 via INTRAVENOUS

## 2013-11-15 SURGICAL SUPPLY — 38 items
ATTRACTOMAT 16X20 MAGNETIC DRP (DRAPES) ×2 IMPLANT
BENZOIN TINCTURE PRP APPL 2/3 (GAUZE/BANDAGES/DRESSINGS) ×2 IMPLANT
BLADE HEX COATED 2.75 (ELECTRODE) ×2 IMPLANT
BLADE SURG 15 STRL LF DISP TIS (BLADE) ×1 IMPLANT
BLADE SURG 15 STRL SS (BLADE) ×1
CANISTER SUCTION 2500CC (MISCELLANEOUS) ×2 IMPLANT
CHLORAPREP W/TINT 26ML (MISCELLANEOUS) ×2 IMPLANT
CLIP TI MEDIUM 6 (CLIP) ×8 IMPLANT
CLIP TI WIDE RED SMALL 6 (CLIP) ×8 IMPLANT
DISSECTOR ROUND CHERRY 3/8 STR (MISCELLANEOUS) IMPLANT
DRAPE PED LAPAROTOMY (DRAPES) ×2 IMPLANT
DRESSING SURGICEL FIBRLLR 1X2 (HEMOSTASIS) ×1 IMPLANT
DRSG SURGICEL FIBRILLAR 1X2 (HEMOSTASIS) ×2
ELECT REM PT RETURN 9FT ADLT (ELECTROSURGICAL) ×2
ELECTRODE REM PT RTRN 9FT ADLT (ELECTROSURGICAL) ×1 IMPLANT
GAUZE SPONGE 4X4 12PLY STRL (GAUZE/BANDAGES/DRESSINGS) IMPLANT
GAUZE SPONGE 4X4 16PLY XRAY LF (GAUZE/BANDAGES/DRESSINGS) ×2 IMPLANT
GLOVE SURG ORTHO 8.0 STRL STRW (GLOVE) ×2 IMPLANT
GOWN STRL REUS W/TWL XL LVL3 (GOWN DISPOSABLE) ×4 IMPLANT
KIT BASIN OR (CUSTOM PROCEDURE TRAY) ×2 IMPLANT
NS IRRIG 1000ML POUR BTL (IV SOLUTION) ×2 IMPLANT
PACK BASIC VI WITH GOWN DISP (CUSTOM PROCEDURE TRAY) ×2 IMPLANT
PENCIL BUTTON HOLSTER BLD 10FT (ELECTRODE) ×2 IMPLANT
SHEARS HARMONIC 9CM CVD (BLADE) ×2 IMPLANT
SPONGE GAUZE 4X4 12PLY (GAUZE/BANDAGES/DRESSINGS) ×2 IMPLANT
STAPLER VISISTAT 35W (STAPLE) IMPLANT
STRIP CLOSURE SKIN 1/2X4 (GAUZE/BANDAGES/DRESSINGS) ×2 IMPLANT
SUT MNCRL AB 4-0 PS2 18 (SUTURE) ×4 IMPLANT
SUT SILK 2 0 (SUTURE)
SUT SILK 2-0 18XBRD TIE 12 (SUTURE) IMPLANT
SUT SILK 3 0 (SUTURE)
SUT SILK 3-0 18XBRD TIE 12 (SUTURE) IMPLANT
SUT VIC AB 3-0 SH 18 (SUTURE) ×4 IMPLANT
SYR BULB IRRIGATION 50ML (SYRINGE) ×2 IMPLANT
TAPE CLOTH SURG 4X10 WHT LF (GAUZE/BANDAGES/DRESSINGS) ×2 IMPLANT
TOWEL OR 17X26 10 PK STRL BLUE (TOWEL DISPOSABLE) ×2 IMPLANT
TOWEL OR NON WOVEN STRL DISP B (DISPOSABLE) ×2 IMPLANT
YANKAUER SUCT BULB TIP 10FT TU (MISCELLANEOUS) ×2 IMPLANT

## 2013-11-15 NOTE — Brief Op Note (Signed)
11/15/2013  9:11 AM  PATIENT:  [name] [name]  33 y.o. female  PRE-OPERATIVE DIAGNOSIS:  Graves' disease (hyperthyroidism)  POST-OPERATIVE DIAGNOSIS:  same  PROCEDURE:  Total thyroidectomy  SURGEON:  Surgeon(s) and Role:    * Earnstine Regal, MD - Primary  ANESTHESIA:   general  EBL:  Total I/O In: 850 [I.V.:850] Out: 5 [Blood:5]  BLOOD ADMINISTERED:none  DRAINS: none   LOCAL MEDICATIONS USED:  NONE  SPECIMEN:  Excision  DISPOSITION OF SPECIMEN:  PATHOLOGY  COUNTS:  YES  TOURNIQUET:  * No tourniquets in log *  DICTATION: .Other Dictation: Dictation Number [id number]  PLAN OF CARE: Admit for overnight observation  PATIENT DISPOSITION:  PACU - hemodynamically stable.   Delay start of Pharmacological VTE agent (>24hrs) due to surgical blood loss or risk of bleeding: yes  Earnstine Regal, MD, [facility] Office: [phone]

## 2013-11-15 NOTE — Op Note (Signed)
NAME:  [name], [name]                  ACCOUNT NO.:  [id number]  MEDICAL RECORD NO.:  [id number]  LOCATION:  [facility]                         FACILITY[facility]  [name]  PHYSICIAN:  Earnstine Regal, MD      DATE OF BIRTH:  1980-08-25  DATE OF PROCEDURE:  11/15/2013                               OPERATIVE REPORT   PREOPERATIVE DIAGNOSIS:  Hyperthyroidism [name]).  POSTOPERATIVE DIAGNOSIS:  same  PROCEDURE:  Total thyroidectomy.  SURGEON:  Earnstine Regal, MD, FACS  ANESTHESIA:  General per Dr. [name].  ESTIMATED BLOOD LOSS:  Minimal.  PREPARATION:  ChloraPrep.  COMPLICATIONS:  None.  INDICATIONS:  The patient is a 33 year old female referred by her endocrinologist for hyperthyroidism.  The patient had been diagnosed with Graves disease during her 4th pregnancy.  She was treated with propylthiouracil.  Recent TSH level remains markedly suppressed at 0.04. The patient is now taking methimazole.  The patient has had symptoms of tremor, night sweats, and double vision.  The patient now comes to Surgery for total thyroidectomy for management of hyperthyroidism.  BODY OF REPORT:  Procedure was done in OR #1 at the [name].  The patient was brought to the operating room, placed in a supine position on the operating room table.  Following administration of general anesthesia, the patient was positioned and then prepped and draped in the usual aseptic fashion.  After ascertaining that an adequate level of anesthesia had been achieved, a Kocher incision was made with a #15 blade.  Dissection was carried through subcutaneous tissues and platysma.  Hemostasis was achieved with the electrocautery.  Skin flaps were elevated cephalad and caudad from the thyroid notch to the sternal notch.  A Mahorner self-retaining retractor was placed for exposure.  Strap muscles were incised in the midline.  Dissection was begun on the left side.  Strap muscles were reflected  laterally exposing the left thyroid lobe. Middle thyroid vein was divided between Ligaclips with the Harmonic scalpel.  Using gentle blunt dissection, the entire left lobe was exposed.  Superior pole was dissected out and vessels were divided individually between small and medium Ligaclips with the Harmonic scalpel.  Parathyroid tissue was identified and preserved.  Inferior venous tributaries were divided between Ligaclips with the Harmonic scalpel.  Gland was rolled anteriorly.  Branches of the inferior thyroid artery were divided between small Ligaclips with the Harmonic scalpel. Recurrent laryngeal nerve was identified and preserved.  Ligament of Gwenlyn Found was released with the electrocautery and the gland was mobilized onto the anterior trachea.  Isthmus was mobilized across the midline. There was no significant pyramidal lobe identified.  Next, we turned our attention to the right thyroid lobe.  Again, strap muscles were reflected laterally.  Middle thyroid vein was divided between Ligaclips with the Harmonic scalpel.  The entire lobe was gently dissected out with blunt dissection.  Superior pole vessels were divided individually between small and medium Ligaclips with the Harmonic scalpel.  Gland was rolled anteriorly.  Parathyroid tissue was identified and preserved.  Branches of the inferior thyroid artery were divided between small Ligaclips with the Harmonic scalpel.  Inferior venous tributaries were divided between  Ligaclips.  Ligament of Gwenlyn Found was released with the electrocautery.  Recurrent laryngeal nerve was identified and preserved.  Gland was mobilized off the trachea using the electrocautery for hemostasis.  The entire thyroid was excised.  A suture was used to Holben the right superior pole.  The entire thyroid gland was submitted to Pathology for review.  The neck was irrigated with warm saline bilaterally.  Good hemostasis was noted throughout.  Fibrillar was placed  throughout the operative field.  Strap muscles were reapproximated in the midline with interrupted 3-0 Vicryl sutures.  Platysma was closed with interrupted 3- 0 Vicryl sutures.  Skin was closed with a running 4-0 Monocryl subcuticular suture.  Wound was washed and dried and benzoin and Steri- Strips were applied.  Sterile dressings were applied.  The patient was awakened from anesthesia and brought to the recovery room.  The patient tolerated the procedure well.   Earnstine Regal, MD, [facility] Office: [phone]   TMG/MEDQ  D:  11/15/2013  T:  11/15/2013  Job:  [id number]  cc:   Jacelyn Pi, M.D. Fax: [phone]

## 2013-11-15 NOTE — Transfer of Care (Signed)
Immediate Anesthesia Transfer of Care Note  Patient: [name] [name]  Procedure(s) Performed: Procedure(s): TOTAL THYROIDECTOMY (Bilateral)  Patient Location: PACU  Anesthesia Type:General  Level of Consciousness: sedated  Airway & Oxygen Therapy: Patient Spontanous Breathing and Patient connected to nasal cannula oxygen  Post-op Assessment: Report given to PACU RN and Post -op Vital signs reviewed and stable  Post vital signs: stable  Complications: No apparent anesthesia complications

## 2013-11-15 NOTE — Anesthesia Postprocedure Evaluation (Signed)
Anesthesia Post Note  Patient: [name] [name]  Procedure(s) Performed: Procedure(s) (LRB): TOTAL THYROIDECTOMY (Bilateral)  Anesthesia type: General  Patient location: PACU  Post pain: Pain level controlled  Post assessment: Post-op Vital signs reviewed  Last Vitals:  Filed Vitals:   11/15/13 1334  BP: 100/63  Pulse: 74  Temp: 36.4 C  Resp: 16    Post vital signs: Reviewed  Level of consciousness: sedated  Complications: No apparent anesthesia complications

## 2013-11-15 NOTE — H&P (Signed)
[name] [name] is an 33 y.o. female.    General Surgery [facility]  Chief Complaint: hyperthyroidism [name] [name]' disease)  HPI:  hyperthyroidism - referral from Dr. Jacelyn Pi    The patient is a 33 year old female referred by her endocrinologist for hyperthyroidism. Patient was initially diagnosed with Graves' disease during her fourth pregnancy. She was treated with propylthiouracil. Patient was off medication for a time with borderline TSH levels. Recent TSH level however was markedly suppressed at 0.04. Patient is now taking methimazole.  Patient is has had symptoms of tremor, night sweats, and double vision. Other than laboratory studies, she has had no imaging studies performed.  Patient has discussed options for management with her endocrinologist. This includes treatment with radioactive iodine versus having total thyroidectomy. Patient is interested in the surgical option.  Patient has no prior history of thyroid disease. There is a family history of hypothyroidism in the patient's maternal grandmother. There is no history of thyroid malignancy. There is no family history of other endocrine neoplasms.    Past Medical History  Diagnosis Date  . Graves disease     Hyper thyroid  . Heart murmur     as child  . H/O bronchitis   . Headache(784.0)     occasional  . Anemia     hx of    Past Surgical History  Procedure Laterality Date  . Wisdom tooth extraction  2012  . No past surgeries      Family History  Problem Relation Age of Onset  . Cancer Mother     Peripheral T cell Lymphoma  . Diabetes Father    Social History:  reports that she has never smoked. She has never used smokeless tobacco. She reports that she does not drink alcohol or use illicit drugs.  Allergies: No Known Allergies  Medications Prior to Admission  Medication Sig Dispense Refill  . methimazole (TAPAZOLE) 10 MG tablet Take 10 mg by mouth daily.       . Multiple Vitamin  (MULTIVITAMIN WITH MINERALS) TABS tablet Take 1 tablet by mouth daily.        No results found for this or any previous visit (from the past 48 hour(s)). No results found.  Review of Systems  Constitutional: Positive for diaphoresis.  Eyes: Positive for double vision.  Respiratory: Negative.   Cardiovascular: Positive for palpitations.  Gastrointestinal: Negative.   Genitourinary: Negative.   Musculoskeletal: Negative.   Skin: Negative.   Neurological: Positive for tremors.  Endo/Heme/Allergies: Negative.   Psychiatric/Behavioral: Negative.     Blood pressure 105/63, pulse 69, temperature 97.7 F (36.5 C), temperature source Oral, resp. rate 18, SpO2 100.00%. Physical Exam  Constitutional: She is oriented to person, place, and time. She appears well-developed and well-nourished. No distress.  HENT:  Head: Normocephalic and atraumatic.  Right Ear: External ear normal.  Left Ear: External ear normal.  Eyes: Conjunctivae are normal. Pupils are equal, round, and reactive to light. No scleral icterus.  Neck: Normal range of motion. Neck supple. No tracheal deviation present. No thyromegaly present.  Cardiovascular: Normal rate, regular rhythm and normal heart sounds.   Respiratory: Effort normal and breath sounds normal. She has no wheezes.  GI: Soft. Bowel sounds are normal. She exhibits no distension.  Musculoskeletal: Normal range of motion. She exhibits no edema.  Neurological: She is alert and oriented to person, place, and time.  Skin: Skin is warm and dry.  Psychiatric: She has a normal mood and affect. Her behavior  is normal.     Assessment/Plan Hyperthyroidism [name] [name]' disease)  Plan total thyroidectomy  The risks and benefits of the procedure have been discussed at length with the patient.  The patient understands the proposed procedure, potential alternative treatments, and the course of recovery to be expected.  All of the patient's questions have been answered at  this time.  The patient wishes to proceed with surgery.  Earnstine Regal, MD, [facility] Office: [place] 11/15/2013, 7:08 AM

## 2013-11-16 ENCOUNTER — Encounter (HOSPITAL_COMMUNITY): Payer: Self-pay | Admitting: Surgery

## 2013-11-16 LAB — BASIC METABOLIC PANEL
BUN: 7 mg/dL (ref 6–23)
CHLORIDE: 101 meq/L (ref 96–112)
CO2: 25 mEq/L (ref 19–32)
Calcium: 9.5 mg/dL (ref 8.4–10.5)
Creatinine, Ser: 0.64 mg/dL (ref 0.50–1.10)
Glucose, Bld: 113 mg/dL — ABNORMAL HIGH (ref 70–99)
POTASSIUM: 3.9 meq/L (ref 3.7–5.3)
Sodium: 138 mEq/L (ref 137–147)

## 2013-11-16 MED ORDER — CALCIUM CARBONATE 1250 (500 CA) MG PO TABS: 2.0000 | ORAL_TABLET | Freq: Three times a day (TID) | ORAL | Status: DC

## 2013-11-16 MED ORDER — HYDROCODONE-ACETAMINOPHEN 5-325 MG PO TABS: 1.0000 | ORAL_TABLET | ORAL | Status: DC | PRN

## 2013-11-16 NOTE — Discharge Summary (Signed)
Physician Discharge Summary [facility]  Patient ID: [name] [name] MRN: [id number] DOB/AGE: Jun 03, 1980 33 y.o.  Admit date: 11/15/2013 Discharge date: 11/16/2013  Admission Diagnoses:  Graves' disease  Discharge Diagnoses:  Principal Problem:   Hyperthyroidism Active Problems:   Graves disease   Discharged Condition: good  Hospital Course: Patient was admitted for observation following thyroid surgery.  Post op course was uncomplicated.  Pain was well controlled.  Tolerated diet.  Post op calcium level on morning following surgery was 9.5 mg/dl.  Patient was prepared for discharge home on POD#1.  Consults: None  Treatments: surgery: total thyroidectomy  Discharge Exam: Blood pressure 100/67, pulse 82, temperature 98.4 F (36.9 C), temperature source Oral, resp. rate 16, height 5\' 6"  (1.676 m), weight 157 lb 0.7 oz (71.234 kg), SpO2 99.00%. HEENT - clear Neck - wound clear and dry, minimal STS; voice normal Chest - clear bilaterally Cor - RRR   Disposition: Home  Discharge Instructions   Apply dressing    Complete by:  As directed   Apply light gauze dressing to wound before discharge home today.     Diet - low sodium heart healthy    Complete by:  As directed      Discharge instructions    Complete by:  As directed   THYROID & PARATHYROID SURGERY - POST OP INSTRUCTIONS  Always review your discharge instruction sheet from the facility where your surgery was performed.  A prescription for pain medication may be given to you upon discharge.  Take your pain medication as prescribed.  If narcotic pain medicine is not needed, then you may take acetaminophen (Tylenol) or ibuprofen (Advil) as needed.  Take your usually prescribed medications unless otherwise directed.  If you need a refill on your pain medication, please contact your pharmacy. They will contact our office to request authorization.  Prescriptions will not be processed after 5 pm or on  weekends.  Start with a light diet upon arrival home, such as soup and crackers or toast.  Be sure to drink plenty of fluids daily.  Resume your normal diet the day after surgery.  Most patients will experience some swelling and bruising on the chest and neck area.  Ice packs will help.  Swelling and bruising can take several days to resolve.   It is common to experience some constipation if taking pain medication after surgery.  Increasing fluid intake and taking a stool softener will usually help or prevent this problem.  A mild laxative (Milk of Magnesia or Miralax) should be taken according to package directions if there are no bowel movements after 48 hours.  You may remove your bandages 24-48 hours after surgery, and you may shower at that time.  You have steri-strips (small skin tapes) in place directly over the incision.  These strips should be left on the skin for 7-10 days and then removed.  You may resume regular (light) daily activities beginning the next day-such as daily self-care, walking, climbing stairs-gradually increasing activities as tolerated.  You may have sexual intercourse when it is comfortable.  Refrain from any heavy lifting or straining until approved by your doctor.  You may drive when you no longer are taking prescription pain medication, you can comfortably wear a seatbelt, and you can safely maneuver your car and apply brakes.  You should see your doctor in the office for a follow-up appointment approximately two to three weeks after your surgery.  Make sure that you call for this  appointment within a day or two after you arrive home to insure a convenient appointment time.  WHEN TO CALL YOUR DOCTOR: -- Fever greater than 101.5 -- Inability to urinate -- Nausea and/or vomiting - persistent -- Extreme swelling or bruising -- Continued bleeding from incision -- Increased pain, redness, or drainage from the incision -- Difficulty swallowing or breathing -- Muscle  cramping or spasms -- Numbness or tingling in hands or around lips  The clinic staff is available to answer your questions during regular business hours.  Please don't hesitate to call and ask to speak to one of the nurses if you have concerns.  Earnstine Regal, MD, [facility] Office: [phone]     Increase activity slowly    Complete by:  As directed      Remove dressing in 24 hours    Complete by:  As directed             Medication List    TAKE these medications       calcium carbonate 1250 MG tablet  Commonly known as:  OS-CAL - dosed in mg of elemental calcium  Take 2 tablets (1,000 mg of elemental calcium total) by mouth 3 (three) times daily with meals.     HYDROcodone-acetaminophen 5-325 MG per tablet  Commonly known as:  NORCO/VICODIN  Take 1-2 tablets by mouth every 4 (four) hours as needed for moderate pain.     multivitamin with minerals Tabs tablet  Take 1 tablet by mouth daily.      ASK your doctor about these medications       methimazole 10 MG tablet  Commonly known as:  TAPAZOLE  Take 10 mg by mouth daily.           Follow-up Information   Follow up with Earnstine Regal, MD In 3 weeks.   Specialty:  General Surgery   Contact information:   [address] [place] [address] [phone]       Earnstine Regal, MD, [facility] Office: [phone]   Signed: Earnstine Regal 11/16/2013, 7:57 AM

## 2013-11-21 ENCOUNTER — Telehealth (INDEPENDENT_AMBULATORY_CARE_PROVIDER_SITE_OTHER): Payer: Self-pay

## 2013-11-21 ENCOUNTER — Other Ambulatory Visit (INDEPENDENT_AMBULATORY_CARE_PROVIDER_SITE_OTHER): Payer: Self-pay

## 2013-11-21 NOTE — Telephone Encounter (Signed)
Pt notified of benign path result. Pt advised of need for calcium level prior to ov. [facility] Lab slip mailed to pt.

## 2013-12-03 ENCOUNTER — Telehealth (INDEPENDENT_AMBULATORY_CARE_PROVIDER_SITE_OTHER): Payer: Self-pay | Admitting: General Surgery

## 2013-12-03 ENCOUNTER — Encounter (INDEPENDENT_AMBULATORY_CARE_PROVIDER_SITE_OTHER): Payer: Self-pay | Admitting: General Surgery

## 2013-12-03 ENCOUNTER — Encounter (INDEPENDENT_AMBULATORY_CARE_PROVIDER_SITE_OTHER): Payer: Self-pay | Admitting: Surgery

## 2013-12-03 ENCOUNTER — Ambulatory Visit (INDEPENDENT_AMBULATORY_CARE_PROVIDER_SITE_OTHER): Payer: Federal, State, Local not specified - PPO | Admitting: Surgery

## 2013-12-03 VITALS — BP 122/71 | HR 68 | Temp 98.6°F | Resp 14 | Ht 66.0 in | Wt 161.8 lb

## 2013-12-03 DIAGNOSIS — E0500 Thyrotoxicosis with diffuse goiter without thyrotoxic crisis or storm: Secondary | ICD-10-CM

## 2013-12-03 DIAGNOSIS — E0590 Thyrotoxicosis, unspecified without thyrotoxic crisis or storm: Secondary | ICD-10-CM

## 2013-12-03 LAB — CALCIUM: Calcium: 9.1 mg/dL (ref 8.7–10.2)

## 2013-12-03 NOTE — Patient Instructions (Signed)
  CARE OF INCISION   Apply cocoa butter/vitamin E cream (Palmer's brand) to your incision 2 - 3 times daily.  Massage cream into incision for one minute with each application.  Use sunscreen (50 SPF or higher) for first 6 months after surgery if area is exposed to sun.  You may alternate Mederma or other scar reducing cream with cocoa butter cream if desired.       Sunshyne Horvath M. Lajoya Dombek, MD, FACS      [facility]      Office: [phone]    

## 2013-12-03 NOTE — Telephone Encounter (Signed)
Called patient to let her know that her lab work was normal. The calcium level was normal

## 2013-12-03 NOTE — Progress Notes (Signed)
General Surgery [facility]  Chief Complaint  Patient presents with  . Routine Post Op    total thyroidectomy 11/15/2013    HISTORY: The patient returns for her first postoperative visit having undergone total thyroidectomy for Graves' disease on 11/15/2013. Final pathology was benign. Patient has seen her endocrinologist and is currently taking levothyroxine 75 mcg daily.  EXAM: Surgical incision is healing nicely. Minimal soft tissue swelling. No sign of seroma. No sign of infection. Voice quality is normal.  IMPRESSION: Status post total thyroidectomy for Graves' disease  PLAN: Patient will begin applying topical creams to her incision. She will see her endocrinologist to monitor her thyroid hormone replacement dosage.  Patient will return in 6 weeks for final wound check.  Earnstine Regal, MD, [facility]   Visit Diagnoses: 1. Hyperthyroidism   2. Graves disease

## 2013-12-03 NOTE — Progress Notes (Signed)
Quick Note:  Calcium normal post op at 9.1 mg/dl.  Earnstine Regal, MD, [facility] Office: [phone]   ______

## 2014-01-14 ENCOUNTER — Encounter (INDEPENDENT_AMBULATORY_CARE_PROVIDER_SITE_OTHER): Payer: Self-pay | Admitting: Surgery

## 2014-01-14 ENCOUNTER — Ambulatory Visit (INDEPENDENT_AMBULATORY_CARE_PROVIDER_SITE_OTHER): Payer: Federal, State, Local not specified - PPO | Admitting: Surgery

## 2014-01-14 VITALS — BP 118/80 | HR 81 | Temp 97.2°F | Ht 66.0 in | Wt 161.0 lb

## 2014-01-14 DIAGNOSIS — E0500 Thyrotoxicosis with diffuse goiter without thyrotoxic crisis or storm: Secondary | ICD-10-CM

## 2014-01-14 NOTE — Patient Instructions (Signed)
Apply hydrocortisone cream to incision once daily to prevent keloid formation  Earnstine Regal, MD, [facility] Office: [phone]

## 2014-01-14 NOTE — Progress Notes (Signed)
General Surgery [facility]  Chief Complaint  Patient presents with  . Routine Post Op    total thyroidectomy for Graves' disease 11/15/2013    HISTORY: Patient is a 33 year old female who underwent total thyroidectomy for Graves' disease in June 2015. Postoperative course has been uneventful. She is taking Synthroid 75 mcg daily under the direction of her endocrinologist.  EXAM: Surgical wound is healing nicely. No sign of infection. Voice quality is normal. There may be slight keloid formation.  IMPRESSION: Status post total thyroidectomy for Graves' disease  PLAN: Patient will apply topical creams to her incision. I have advised her to add a daily dose of hydrocortisone topically in hopes of preventing keloid formation.  Patient will see her endocrinologist for laboratory studies and dosage adjustment in the near future.  Patient will return for surgical care as needed.  Earnstine Regal, MD, [facility]   Visit Diagnoses: 1. Graves disease

## 2014-11-11 ENCOUNTER — Encounter: Payer: Self-pay | Admitting: Dietician

## 2014-11-11 ENCOUNTER — Encounter: Payer: Federal, State, Local not specified - PPO | Attending: Obstetrics and Gynecology | Admitting: Dietician

## 2014-11-11 VITALS — Ht 66.0 in | Wt 153.6 lb

## 2014-11-11 DIAGNOSIS — Z713 Dietary counseling and surveillance: Secondary | ICD-10-CM | POA: Diagnosis not present

## 2014-11-11 DIAGNOSIS — R7309 Other abnormal glucose: Secondary | ICD-10-CM | POA: Insufficient documentation

## 2014-11-11 DIAGNOSIS — R7303 Prediabetes: Secondary | ICD-10-CM

## 2014-11-11 NOTE — Patient Instructions (Addendum)
-  Pre portion snacks and ice cream into a small bowl and enjoy it slowly! -Be mindful of the food label -Continue regular meal pattern -Pair carbs with protein at each meal and snack -Limit juice and soda   -Have a piece of fruit instead of drinking juice  -Drink a big glass of water before having juice -Continue physical activity -Follow MyPlate method (fill up on non starchy vegetables and limit starchy foods)   Goal weight: 140 lbs Healthy weight loss is 1-2 lbs per week

## 2014-11-11 NOTE — Progress Notes (Signed)
  Medical Nutrition Therapy:  Appt start time: 0830 end time:  0910   Assessment:  Primary concerns today: [name] [name] states that she is here to discuss better eating habits. She reports that she has recently changed her eating habits in the last few months after learning that her HgbA1c is in the prediabetes range (6.1%). She has lost 10 pounds in the past few months by eating vegetables and protein and drinking more water. Her goal is to lose another 10 pounds. [name] [name] lives with her husband and 4 daughters. 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   Preferred Learning Style:   No preference indicated   Learning Readiness:   Ready  Change in progress   MEDICATIONS: levothyroxine   DIETARY INTAKE:  Usual eating pattern includes 3 meals and 1 snacks per day. Avoided foods include milk (can tolerate cheese and yogurt).    24-hr recall:  B ( AM): bowl of [place] or Raisin Bran crunch or Special K cereal with almond milk  Snk ( AM):   L ( PM): ham or [place] and cheese sandwich with mayo and mustard Snk ( PM):  D ( PM): chicken and vegetables and potatoes or macaroni and cheese Snk ( PM): something sweet like ice cream, a Little Debbie cake, or chips  Beverages: water, about 24 oz juice, occasionally soda 2x a week  Usual physical activity: uses FitBit; walks a lot overall at kids track practice and 5k this weekend  Estimated energy needs: 1600-1800 calories 120-135 g CHO (30 grams at meals and 15 grams at snacks)  Progress Towards Goal(s):  In progress.   Nutritional Diagnosis:  [place]-2.2 Altered nutrition-related laboratory As related to history of inappropriate food choices and excessive carbohydrate intake.  As evidenced by HgbA1c 6.1%.    Intervention:  Nutrition counseling provided. Educated patient on macronutrient absorption. Discussed different forms of carbohydrate and identified foods that contain carbs. Encouraged continued weight loss and physical activity.   Goals: -Pre portion snacks and ice cream into a small bowl and enjoy it slowly! -Be mindful of the food label -Continue regular meal pattern -Pair carbs with protein at each meal and snack -Limit juice and soda   -Have a piece of fruit instead of drinking juice  -Drink a big glass of water before having juice -Continue physical activity -Follow MyPlate method (fill up on non starchy vegetables and limit starchy foods)  Goal weight: 140 lbs Healthy weight loss is 1-2 lbs per week  Teaching Method Utilized:  Visual Auditory Hands on  Handouts given during visit include:  MyPlate  Meal planning card  Barriers to learning/adherence to lifestyle change: none  Demonstrated degree of understanding via:  Teach Back   Monitoring/Evaluation:  Dietary intake, exercise, labs, and body weight prn.

## 2015-09-15 DIAGNOSIS — E890 Postprocedural hypothyroidism: Secondary | ICD-10-CM | POA: Diagnosis not present

## 2015-09-30 DIAGNOSIS — R202 Paresthesia of skin: Secondary | ICD-10-CM | POA: Diagnosis not present

## 2015-09-30 DIAGNOSIS — R7309 Other abnormal glucose: Secondary | ICD-10-CM | POA: Diagnosis not present

## 2015-09-30 DIAGNOSIS — Z0000 Encounter for general adult medical examination without abnormal findings: Secondary | ICD-10-CM | POA: Diagnosis not present

## 2015-10-28 DIAGNOSIS — Z1151 Encounter for screening for human papillomavirus (HPV): Secondary | ICD-10-CM | POA: Diagnosis not present

## 2015-10-28 DIAGNOSIS — Z30432 Encounter for removal of intrauterine contraceptive device: Secondary | ICD-10-CM | POA: Diagnosis not present

## 2015-10-28 DIAGNOSIS — Z6826 Body mass index (BMI) 26.0-26.9, adult: Secondary | ICD-10-CM | POA: Diagnosis not present

## 2015-10-28 DIAGNOSIS — Z01419 Encounter for gynecological examination (general) (routine) without abnormal findings: Secondary | ICD-10-CM | POA: Diagnosis not present

## 2015-12-08 DIAGNOSIS — E0590 Thyrotoxicosis, unspecified without thyrotoxic crisis or storm: Secondary | ICD-10-CM | POA: Diagnosis not present

## 2015-12-08 DIAGNOSIS — E890 Postprocedural hypothyroidism: Secondary | ICD-10-CM | POA: Diagnosis not present

## 2015-12-08 DIAGNOSIS — R7301 Impaired fasting glucose: Secondary | ICD-10-CM | POA: Diagnosis not present

## 2015-12-15 DIAGNOSIS — R7301 Impaired fasting glucose: Secondary | ICD-10-CM | POA: Diagnosis not present

## 2015-12-15 DIAGNOSIS — E890 Postprocedural hypothyroidism: Secondary | ICD-10-CM | POA: Diagnosis not present

## 2016-02-18 DIAGNOSIS — E890 Postprocedural hypothyroidism: Secondary | ICD-10-CM | POA: Diagnosis not present

## 2016-03-27 ENCOUNTER — Ambulatory Visit (HOSPITAL_COMMUNITY)
Admission: EM | Admit: 2016-03-27 | Discharge: 2016-03-27 | Disposition: A | Discharge status: Home / Self Care | Payer: Federal, State, Local not specified - PPO | Attending: Internal Medicine | Admitting: Internal Medicine

## 2016-03-27 ENCOUNTER — Encounter (HOSPITAL_COMMUNITY): Payer: Self-pay | Admitting: Emergency Medicine

## 2016-03-27 DIAGNOSIS — J209 Acute bronchitis, unspecified: Secondary | ICD-10-CM | POA: Diagnosis not present

## 2016-03-27 MED ORDER — AZITHROMYCIN 250 MG PO TABS: 250.0000 mg | ORAL_TABLET | Freq: Every day | ORAL | 0 refills | Status: DC

## 2016-03-27 NOTE — Discharge Instructions (Signed)
COUGH MAY LINGER FOR UP TO 6 WEEKS.

## 2016-03-27 NOTE — ED Triage Notes (Signed)
The patient presented to the [facility] with a complaint of a cough x 3 weeks. The patient stated that the cough is productive and it started after receiving the flu vaccine. The patient reported using OTC cough meds with minimal results.

## 2016-03-27 NOTE — ED Provider Notes (Signed)
CSN: [id number]     Arrival date & time 03/27/16  1712 History   First MD Initiated Contact with Patient 03/27/16 1734     Chief Complaint  Patient presents with  . Cough   (Consider location/radiation/quality/duration/timing/severity/associated sxs/prior Treatment) HPI NP 35 Y/O FEMALE WITH 3 WEEK HX OF DRY NON PRODUCTIVE COUGH. NO RELIEF WITH OTC MEDICATIONS. NON SMOKER Past Medical History:  Diagnosis Date  . Anemia    hx of  . Graves disease    Hyper thyroid  . H/O bronchitis   . Headache(784.0)    occasional  . Heart murmur    as child  . Prediabetes    Past Surgical History:  Procedure Laterality Date  . NO PAST SURGERIES    . THYROIDECTOMY Bilateral 11/15/2013   Procedure: TOTAL THYROIDECTOMY;  Surgeon: Earnstine Regal, MD;  Location: [facility];  Service: General;  Laterality: Bilateral;  . WISDOM TOOTH EXTRACTION  2012   Family History  Problem Relation Age of Onset  . Cancer Mother     Peripheral T cell Lymphoma  . Diabetes Father   . Diabetes Maternal Grandmother    Social History  Substance Use Topics  . Smoking status: Never Smoker  . Smokeless tobacco: Never Used  . Alcohol use No   OB History    No data available     Review of Systems  Denies: HEADACHE, NAUSEA, ABDOMINAL PAIN, CHEST PAIN, CONGESTION, DYSURIA, SHORTNESS OF BREATH  Allergies  Review of patient's allergies indicates no known allergies.  Home Medications   Prior to Admission medications   Medication Sig Start Date End Date Taking? Authorizing Provider  levothyroxine (SYNTHROID, LEVOTHROID) 75 MCG tablet Take 75 mcg by mouth daily before breakfast.   Yes Historical Provider, MD  Multiple Vitamin (MULTIVITAMIN WITH MINERALS) TABS tablet Take 1 tablet by mouth daily.   Yes Historical Provider, MD  calcium carbonate (OS-CAL - DOSED IN MG OF ELEMENTAL CALCIUM) 1250 MG tablet Take 2 tablets (1,000 mg of elemental calcium total) by mouth 3 (three) times daily with meals. 11/16/13   Armandina Gemma, MD   Meds Ordered and Administered this Visit  Medications - No data to display  BP 108/71 (BP Location: Left Arm)   Pulse 77   Temp 98.5 F (36.9 C) (Oral)   Resp 18   LMP 03/04/2016 (Exact Date)   SpO2 100%  No data found.   Physical Exam NURSES NOTES AND VITAL SIGNS REVIEWED. CONSTITUTIONAL: Well developed, well nourished, no acute distress HEENT: normocephalic, atraumatic EYES: Conjunctiva normal NECK:normal ROM, supple, no adenopathy PULMONARY:No respiratory distress, normal effort ABDOMINAL: Soft, ND, NT BS+, No CVAT MUSCULOSKELETAL: Normal ROM of all extremities,  SKIN: warm and dry without rash PSYCHIATRIC: Mood and affect, behavior are normal  Urgent Care Course   Clinical Course    Procedures (including critical care time)  Labs Review Labs Reviewed - No data to display  Imaging Review No results found.   Visual Acuity Review  Right Eye Distance:   Left Eye Distance:   Bilateral Distance:    Right Eye Near:   Left Eye Near:    Bilateral Near:         MDM   1. Acute bronchitis, unspecified organism     Patient is reassured that there are no issues that require transfer to higher level of care at this time or additional tests. Patient is advised to continue home symptomatic treatment. Patient is advised that if there are new or worsening symptoms to  attend the emergency department, contact primary care provider, or return to UC. Instructions of care provided discharged home in stable condition.    THIS NOTE WAS GENERATED USING A VOICE RECOGNITION SOFTWARE PROGRAM. ALL REASONABLE EFFORTS  WERE MADE TO PROOFREAD THIS DOCUMENT FOR ACCURACY.  I have verbally reviewed the discharge instructions with the patient. A printed AVS was given to the patient.  All questions were answered prior to discharge.      Konrad Felix, [facility] 03/27/16 2033

## 2016-06-15 DIAGNOSIS — E890 Postprocedural hypothyroidism: Secondary | ICD-10-CM | POA: Diagnosis not present

## 2016-06-15 DIAGNOSIS — R7301 Impaired fasting glucose: Secondary | ICD-10-CM | POA: Diagnosis not present

## 2016-09-08 DIAGNOSIS — K080 Exfoliation of teeth due to systemic causes: Secondary | ICD-10-CM | POA: Diagnosis not present

## 2016-09-09 DIAGNOSIS — R7301 Impaired fasting glucose: Secondary | ICD-10-CM | POA: Diagnosis not present

## 2016-09-09 DIAGNOSIS — E890 Postprocedural hypothyroidism: Secondary | ICD-10-CM | POA: Diagnosis not present

## 2016-09-10 DIAGNOSIS — K080 Exfoliation of teeth due to systemic causes: Secondary | ICD-10-CM | POA: Diagnosis not present

## 2016-11-12 DIAGNOSIS — Z1151 Encounter for screening for human papillomavirus (HPV): Secondary | ICD-10-CM | POA: Diagnosis not present

## 2016-11-12 DIAGNOSIS — Z01419 Encounter for gynecological examination (general) (routine) without abnormal findings: Secondary | ICD-10-CM | POA: Diagnosis not present

## 2016-11-12 DIAGNOSIS — Z6827 Body mass index (BMI) 27.0-27.9, adult: Secondary | ICD-10-CM | POA: Diagnosis not present

## 2016-12-28 ENCOUNTER — Ambulatory Visit (HOSPITAL_COMMUNITY)
Admission: EM | Admit: 2016-12-28 | Discharge: 2016-12-28 | Disposition: A | Discharge status: Home / Self Care | Payer: Federal, State, Local not specified - PPO | Attending: Emergency Medicine | Admitting: Emergency Medicine

## 2016-12-28 ENCOUNTER — Encounter (HOSPITAL_COMMUNITY): Payer: Self-pay | Admitting: Emergency Medicine

## 2016-12-28 DIAGNOSIS — J029 Acute pharyngitis, unspecified: Secondary | ICD-10-CM | POA: Diagnosis not present

## 2016-12-28 DIAGNOSIS — R7303 Prediabetes: Secondary | ICD-10-CM | POA: Diagnosis not present

## 2016-12-28 DIAGNOSIS — Z807 Family history of other malignant neoplasms of lymphoid, hematopoietic and related tissues: Secondary | ICD-10-CM | POA: Diagnosis not present

## 2016-12-28 DIAGNOSIS — Z79899 Other long term (current) drug therapy: Secondary | ICD-10-CM | POA: Insufficient documentation

## 2016-12-28 DIAGNOSIS — Z833 Family history of diabetes mellitus: Secondary | ICD-10-CM | POA: Insufficient documentation

## 2016-12-28 DIAGNOSIS — E0500 Thyrotoxicosis with diffuse goiter without thyrotoxic crisis or storm: Secondary | ICD-10-CM | POA: Diagnosis not present

## 2016-12-28 LAB — POCT RAPID STREP A: STREPTOCOCCUS, GROUP A SCREEN (DIRECT): NEGATIVE

## 2016-12-28 MED ORDER — IBUPROFEN 600 MG PO TABS: 600.0000 mg | ORAL_TABLET | Freq: Four times a day (QID) | ORAL | 0 refills | Status: DC | PRN

## 2016-12-28 NOTE — ED Notes (Signed)
Strep swab obtained and placed in lab

## 2016-12-28 NOTE — Discharge Instructions (Signed)
your rapid strep was negative today, so we have sent off a throat culture.  We will contact you and call in the appropriate antibiotics if your culture comes back positive for an infection requiring antibiotic treatment.  Give [place] a working phone number.  1 gram of Tylenol and 600 mg ibuprofen together 3-4 times a day as needed for pain.  Make sure you drink plenty of extra fluids.  Some people find salt water gargles and  Traditional Medicinal's "Throat Coat" tea helpful. Take 5 mL of liquid Benadryl and 5 mL of Maalox. Mix it together, and then hold it in your mouth for as long as you can and then swallow. You may do this 4 times a day.    Try some Flonase and Mucinex D to help with the nasal congestion and prevent secondary bacterial sinus infection  Go to www.goodrx.com to look up your medications. This will give you a list of where you can find your prescriptions at the most affordable prices. Or ask the pharmacist what the cash price is, or if they have any other discount programs available to help make your medication more affordable. This can be less expensive than what you would pay with insurance.    Below is a list of primary care practices who are taking new patients for you to follow-up with. [facility] [place], [place] [address] [phone]  [facility] [phone] [place] [address]   family Practice Center: [place]  [phone]  [place] and [facility]: [place]   [phone]  [facility]: [address] [place]  [phone]  [place] primary care : [address] [place] [phone]  [facility]: [address] [place]   [id number] [phone]  [facility]: [place] [phone]  Dr. Blanchie Serve [place]  [phone]  Dr. Benito Mccreedy, [facility]. [place], [place] [address]  [phone]

## 2016-12-28 NOTE — ED Provider Notes (Signed)
HPI  SUBJECTIVE:  Patient reports sore throat starting 3-4 days ago. Sx worse with swallowing.  Sx better with ibuprofen. Has been taking TheraFlu, Mucinex, hot keys. No fevers + Swollen neck glands    + Cough, nasal congestion, no rhinorrhea postnasal drip + Myalgias + Headache- resolved No Rash     + Recent Strep Exposure daughter with strep currently No Abdominal Pain No reflux sxs No Allergy sxs  No Breathing difficulty, voice changes No Drooling No Trismus No abx in past month.  No antipyretic in past 4-6 hrs Past medical history negative for mono, strep, hypertension. She is a prediabetic. LMP: One week ago. Denies possibility being pregnant.  PMD: None   Past Medical History:  Diagnosis Date  . Anemia    hx of  . Graves disease    Hyper thyroid  . H/O bronchitis   . Headache(784.0)    occasional  . Heart murmur    as child  . Prediabetes     Past Surgical History:  Procedure Laterality Date  . NO PAST SURGERIES    . THYROIDECTOMY Bilateral 11/15/2013   Procedure: TOTAL THYROIDECTOMY;  Surgeon: Earnstine Regal, MD;  Location: [facility];  Service: General;  Laterality: Bilateral;  . WISDOM TOOTH EXTRACTION  2012    Family History  Problem Relation Age of Onset  . Cancer Mother        Peripheral T cell Lymphoma  . Diabetes Father   . Diabetes Maternal Grandmother     Social History  Substance Use Topics  . Smoking status: Never Smoker  . Smokeless tobacco: Never Used  . Alcohol use No    No current facility-administered medications for this encounter.   Current Outpatient Prescriptions:  .  levothyroxine (SYNTHROID, LEVOTHROID) 75 MCG tablet, Take 75 mcg by mouth daily before breakfast., Disp: , Rfl:  .  calcium carbonate (OS-CAL - DOSED IN MG OF ELEMENTAL CALCIUM) 1250 MG tablet, Take 2 tablets (1,000 mg of elemental calcium total) by mouth 3 (three) times daily with meals., Disp: 60 tablet, Rfl: 1 .  ibuprofen (ADVIL,MOTRIN) 600 MG tablet, Take 1  tablet (600 mg total) by mouth every 6 (six) hours as needed., Disp: 30 tablet, Rfl: 0 .  Multiple Vitamin (MULTIVITAMIN WITH MINERALS) TABS tablet, Take 1 tablet by mouth daily., Disp: , Rfl:   No Known Allergies   ROS  As noted in HPI.   Physical Exam  BP 104/72 (BP Location: Left Arm)   Pulse 76   Temp 98.3 F (36.8 C) (Oral)   LMP 12/21/2016 (Approximate)   SpO2 98%   Constitutional: Well developed, well nourished, no acute distress Eyes:  EOMI, conjunctiva normal bilaterally HENT: Normocephalic, atraumatic,mucus membranes moist. + mild nasal congestion + slightly erythematous oropharynx - enlarged tonsils - exudates. Uvula midline.  Respiratory: Normal inspiratory effort Cardiovascular: Normal rate, no murmurs, rubs, gallops GI: nondistended, nontender. No appreciable splenomegaly skin: No rash, skin intact Lymph: +Tender  cervical LN  Musculoskeletal: no deformities Neurologic: Alert & oriented x 3, no focal neuro deficits Psychiatric: Speech and behavior appropriate.   ED Course   Medications - No data to display  Orders Placed This Encounter  Procedures  . POCT rapid strep A [facility])    Standing Status:   Standing    Number of Occurrences:   1    Results for orders placed or performed during the hospital encounter of 12/28/16 (from the past 24 hour(s))  POCT rapid strep A [facility])  Status: None   Collection Time: 12/28/16 11:08 AM  Result Value Ref Range   Streptococcus, Group A Screen (Direct) NEGATIVE NEGATIVE   No results found.  ED Clinical Impression  Acute pharyngitis, unspecified etiology   ED Assessment/Plan  Rapid strep negative. Obtaining throat culture to guide antibiotic treatment. Discussed this with patient. We'll contact them if culture is positive, and will call in Appropriate antibiotics. Patient home with ibuprofen, Tylenol Benadryl/Maalox mixture, Flonase, Mucinex D, ibuprofen 600 mg of 1 g of Tylenol. Patient to  followup with PMD of choice when necessary, will refer to local primary care resources.  presentation consistent with viral pharyngitis  Discussed labs,MDM, plan and followup with patient. Discussed sn/sx that should prompt return to the  ED. Patient agrees with plan.  Meds ordered this encounter  Medications  . ibuprofen (ADVIL,MOTRIN) 600 MG tablet    Sig: Take 1 tablet (600 mg total) by mouth every 6 (six) hours as needed.    Dispense:  30 tablet    Refill:  0     *This clinic note was created using [employer]. Therefore, there may be occasional mistakes despite careful proofreading.    Melynda Ripple, MD 12/28/16 1140

## 2016-12-28 NOTE — ED Triage Notes (Signed)
Pt reports a body aches and a scratchy throat that began on Saturday.  Since then she has developed nasal congestion, drainage and pressure and a cough.  Pt reports that her daughter was diagnosed with Strep Throat last Monday and is on antibiotics.

## 2016-12-30 LAB — CULTURE, GROUP A STREP (THRC)

## 2017-03-08 DIAGNOSIS — E890 Postprocedural hypothyroidism: Secondary | ICD-10-CM | POA: Diagnosis not present

## 2017-03-08 DIAGNOSIS — R7301 Impaired fasting glucose: Secondary | ICD-10-CM | POA: Diagnosis not present

## 2017-03-14 DIAGNOSIS — K080 Exfoliation of teeth due to systemic causes: Secondary | ICD-10-CM | POA: Diagnosis not present

## 2017-06-27 ENCOUNTER — Encounter (HOSPITAL_COMMUNITY): Payer: Self-pay | Admitting: Emergency Medicine

## 2017-06-27 ENCOUNTER — Ambulatory Visit (HOSPITAL_COMMUNITY)
Admission: EM | Admit: 2017-06-27 | Discharge: 2017-06-27 | Disposition: A | Discharge status: Home / Self Care | Payer: Federal, State, Local not specified - PPO | Attending: Family Medicine | Admitting: Family Medicine

## 2017-06-27 DIAGNOSIS — J020 Streptococcal pharyngitis: Secondary | ICD-10-CM

## 2017-06-27 LAB — POCT RAPID STREP A: STREPTOCOCCUS, GROUP A SCREEN (DIRECT): POSITIVE — AB

## 2017-06-27 MED ORDER — PENICILLIN V POTASSIUM 500 MG PO TABS: 500.0000 mg | ORAL_TABLET | Freq: Two times a day (BID) | ORAL | 0 refills | Status: AC

## 2017-06-27 NOTE — Discharge Instructions (Signed)
Rapid strep positive. Start Penicillin as directed. Tylenol/Motrin for fever and pain. There was some nasal congestion/drainage on exam, if start having nasal congestion/drainage, can start flonase and allergy medicine such as zyrtec to help with it. Monitor for any worsening of symptoms, trouble breathing, trouble swallowing, swelling of the throat, follow up here or at the emergency department for reevaluation.

## 2017-06-27 NOTE — ED Provider Notes (Signed)
[place]    CSN: [id number] Arrival date & time: 06/27/17  1348     History   Chief Complaint Chief Complaint  Patient presents with  . Sore Throat    HPI [name] [name] is a 37 y.o. female.   37 year old female comes in for 4-day history of sore throat, chills, low-grade fever.  Denies other URI symptoms such as cough, congestion, rhinorrhea. Dysphagia without trouble breathing, swallowing, drooling, tripoding. States both her daughters have been tested positive for strep. She has been taking ibuprofen for the fever, last dose prior to arrival. Never smoker.      Past Medical History:  Diagnosis Date  . Anemia    hx of  . Graves disease    Hyper thyroid  . H/O bronchitis   . Headache(784.0)    occasional  . Heart murmur    as child  . Prediabetes     Patient Active Problem List   Diagnosis Date Noted  . Graves disease 11/15/2013    Past Surgical History:  Procedure Laterality Date  . NO PAST SURGERIES    . THYROIDECTOMY Bilateral 11/15/2013   Procedure: TOTAL THYROIDECTOMY;  Surgeon: Earnstine Regal, MD;  Location: [facility];  Service: General;  Laterality: Bilateral;  . WISDOM TOOTH EXTRACTION  2012    OB History    No data available       Home Medications    Prior to Admission medications   Medication Sig Start Date End Date Taking? Authorizing Provider  ibuprofen (ADVIL,MOTRIN) 600 MG tablet Take 1 tablet (600 mg total) by mouth every 6 (six) hours as needed. 12/28/16  Yes Melynda Ripple, MD  levothyroxine (SYNTHROID, LEVOTHROID) 75 MCG tablet Take 75 mcg by mouth daily before breakfast.   Yes [provider]  Multiple Vitamin (MULTIVITAMIN WITH MINERALS) TABS tablet Take 1 tablet by mouth daily.   Yes [provider]  penicillin v potassium (VEETID) 500 MG tablet Take 1 tablet (500 mg total) by mouth 2 (two) times daily for 10 days. 06/27/17 07/07/17  Ok Edwards, PA-C    Family History Family History  Problem Relation  Age of Onset  . Cancer Mother        Peripheral T cell Lymphoma  . Diabetes Father   . Diabetes Maternal Grandmother     Social History Social History   Tobacco Use  . Smoking status: Never Smoker  . Smokeless tobacco: Never Used  Substance Use Topics  . Alcohol use: Yes    Comment: social  . Drug use: No     Allergies   Patient has no known allergies.   Review of Systems Review of Systems  Reason unable to perform ROS: See HPI as above.     Physical Exam Triage Vital Signs ED Triage Vitals  Enc Vitals Group     BP 06/27/17 1422 109/66     Pulse Rate 06/27/17 1421 77     Resp 06/27/17 1421 16     Temp 06/27/17 1421 98.8 F (37.1 C)     Temp Source 06/27/17 1421 Oral     SpO2 06/27/17 1421 100 %     Weight 06/27/17 1422 168 lb (76.2 kg)     Height --      Head Circumference --      Peak Flow --      Pain Score 06/27/17 1422 5     Pain Loc --      Pain Edu? --  Excl. in [place]? --    No data found.  Updated Vital Signs BP 109/66   Pulse 77   Temp 98.8 F (37.1 C) (Oral)   Resp 16   Wt 168 lb (76.2 kg)   LMP 06/21/2017   SpO2 100%   BMI 27.12 kg/m   Physical Exam  Constitutional: She is oriented to person, place, and time. She appears well-developed and well-nourished. No distress.  HENT:  Head: Normocephalic and atraumatic.  Right Ear: Tympanic membrane, external ear and ear canal normal. Tympanic membrane is not erythematous and not bulging.  Left Ear: Tympanic membrane, external ear and ear canal normal. Tympanic membrane is not erythematous and not bulging.  Nose: Rhinorrhea present. Right sinus exhibits no maxillary sinus tenderness and no frontal sinus tenderness. Left sinus exhibits no maxillary sinus tenderness and no frontal sinus tenderness.  Mouth/Throat: Uvula is midline and mucous membranes are normal. Posterior oropharyngeal erythema present. Tonsils are 1+ on the right. Tonsils are 1+ on the left. No tonsillar exudate.  Eyes:  Conjunctivae are normal. Pupils are equal, round, and reactive to light.  Neck: Normal range of motion. Neck supple.  Cardiovascular: Normal rate, regular rhythm and normal heart sounds. Exam reveals no gallop and no friction rub.  No murmur heard. Pulmonary/Chest: Effort normal and breath sounds normal. She has no decreased breath sounds. She has no wheezes. She has no rhonchi. She has no rales.  Lymphadenopathy:    She has no cervical adenopathy.  Neurological: She is alert and oriented to person, place, and time.  Skin: Skin is warm and dry.  Psychiatric: She has a normal mood and affect. Her behavior is normal. Judgment normal.     UC Treatments / Results  Labs (all labs ordered are listed, but only abnormal results are displayed) Labs Reviewed  POCT RAPID STREP A - Abnormal; Notable for the following components:      Result Value   Streptococcus, Group A Screen (Direct) POSITIVE (*)    All other components within normal limits    EKG  EKG Interpretation None       Radiology No results found.  Procedures Procedures (including critical care time)  Medications Ordered in UC Medications - No data to display   Initial Impression / Assessment and Plan / UC Course  I have reviewed the triage vital signs and the nursing notes.  Pertinent labs & imaging results that were available during my care of the patient were reviewed by me and considered in my medical decision making (see chart for details).    Rapid strep positive. Start antibiotic as directed. Symptomatic treatment as needed. Return precautions given.   Final Clinical Impressions(s) / UC Diagnoses   Final diagnoses:  Strep pharyngitis    ED Discharge Orders        Ordered    penicillin v potassium (VEETID) 500 MG tablet  2 times daily     06/27/17 1446        Ok Edwards, [place] 06/27/17 1453

## 2017-06-27 NOTE — ED Triage Notes (Signed)
PT reports sore throat, chills, low grade fever. 2 of her children have strep.

## 2017-08-23 DIAGNOSIS — F330 Major depressive disorder, recurrent, mild: Secondary | ICD-10-CM | POA: Diagnosis not present

## 2017-09-06 DIAGNOSIS — F330 Major depressive disorder, recurrent, mild: Secondary | ICD-10-CM | POA: Diagnosis not present

## 2017-09-27 DIAGNOSIS — F330 Major depressive disorder, recurrent, mild: Secondary | ICD-10-CM | POA: Diagnosis not present

## 2017-11-08 DIAGNOSIS — F330 Major depressive disorder, recurrent, mild: Secondary | ICD-10-CM | POA: Diagnosis not present

## 2017-11-22 DIAGNOSIS — K080 Exfoliation of teeth due to systemic causes: Secondary | ICD-10-CM | POA: Diagnosis not present

## 2017-11-22 DIAGNOSIS — F330 Major depressive disorder, recurrent, mild: Secondary | ICD-10-CM | POA: Diagnosis not present

## 2017-12-20 DIAGNOSIS — F330 Major depressive disorder, recurrent, mild: Secondary | ICD-10-CM | POA: Diagnosis not present

## 2018-01-17 DIAGNOSIS — R7301 Impaired fasting glucose: Secondary | ICD-10-CM | POA: Diagnosis not present

## 2018-01-17 DIAGNOSIS — F330 Major depressive disorder, recurrent, mild: Secondary | ICD-10-CM | POA: Diagnosis not present

## 2018-01-17 DIAGNOSIS — E890 Postprocedural hypothyroidism: Secondary | ICD-10-CM | POA: Diagnosis not present

## 2018-02-14 DIAGNOSIS — F330 Major depressive disorder, recurrent, mild: Secondary | ICD-10-CM | POA: Diagnosis not present

## 2018-03-21 DIAGNOSIS — F330 Major depressive disorder, recurrent, mild: Secondary | ICD-10-CM | POA: Diagnosis not present

## 2018-03-21 DIAGNOSIS — E890 Postprocedural hypothyroidism: Secondary | ICD-10-CM | POA: Diagnosis not present

## 2018-03-24 DIAGNOSIS — Z6824 Body mass index (BMI) 24.0-24.9, adult: Secondary | ICD-10-CM | POA: Diagnosis not present

## 2018-03-24 DIAGNOSIS — N898 Other specified noninflammatory disorders of vagina: Secondary | ICD-10-CM | POA: Diagnosis not present

## 2018-03-24 DIAGNOSIS — Z118 Encounter for screening for other infectious and parasitic diseases: Secondary | ICD-10-CM | POA: Diagnosis not present

## 2018-03-24 DIAGNOSIS — Z113 Encounter for screening for infections with a predominantly sexual mode of transmission: Secondary | ICD-10-CM | POA: Diagnosis not present

## 2018-03-24 DIAGNOSIS — Z1151 Encounter for screening for human papillomavirus (HPV): Secondary | ICD-10-CM | POA: Diagnosis not present

## 2018-03-24 DIAGNOSIS — Z114 Encounter for screening for human immunodeficiency virus [HIV]: Secondary | ICD-10-CM | POA: Diagnosis not present

## 2018-03-24 DIAGNOSIS — Z1159 Encounter for screening for other viral diseases: Secondary | ICD-10-CM | POA: Diagnosis not present

## 2018-03-24 DIAGNOSIS — Z01419 Encounter for gynecological examination (general) (routine) without abnormal findings: Secondary | ICD-10-CM | POA: Diagnosis not present

## 2018-04-18 DIAGNOSIS — F330 Major depressive disorder, recurrent, mild: Secondary | ICD-10-CM | POA: Diagnosis not present

## 2018-05-16 DIAGNOSIS — F330 Major depressive disorder, recurrent, mild: Secondary | ICD-10-CM | POA: Diagnosis not present

## 2018-06-06 DIAGNOSIS — K080 Exfoliation of teeth due to systemic causes: Secondary | ICD-10-CM | POA: Diagnosis not present

## 2018-06-24 ENCOUNTER — Ambulatory Visit (HOSPITAL_COMMUNITY)
Admission: EM | Admit: 2018-06-24 | Discharge: 2018-06-24 | Disposition: A | Discharge status: Home / Self Care | Payer: Federal, State, Local not specified - PPO

## 2018-06-24 ENCOUNTER — Encounter (HOSPITAL_COMMUNITY): Payer: Self-pay | Admitting: Emergency Medicine

## 2018-06-24 ENCOUNTER — Other Ambulatory Visit: Payer: Self-pay

## 2018-06-24 DIAGNOSIS — M79604 Pain in right leg: Secondary | ICD-10-CM

## 2018-06-24 NOTE — ED Triage Notes (Signed)
Pain behind right knee for 3-4 days.  Now has pain in right ankle and swelling .

## 2018-06-24 NOTE — Discharge Instructions (Signed)
Use anti-inflammatories for pain/swelling. You may take up to 800 mg Ibuprofen every 8 hours with food. You may supplement Ibuprofen with Tylenol [phone] mg every 8 hours.   Follow up if developing increased redness, swelling or pain in right leg

## 2018-06-25 NOTE — ED Provider Notes (Signed)
[name] [name]    CSN: [id number] Arrival date & time: 06/24/18  1056     History   Chief Complaint Chief Complaint  Patient presents with  . Leg Pain    HPI [name] [name] [name] [name] is a 38 y.o. female history of Graves' disease presenting today for evaluation of right knee and right ankle pain.  Patient states that over the past 3 to 4 days she has noticed discomfort in the posterior aspect of her right knee.  She is also had some discomfort in her right ankle.  States that these pains are separate and do not extend through the calf.  She denies any swelling.  Denies any specific injury.  Bending was noted as an aggravating factor.  Denies previous DVT/PE.  Denies recent travel immobilization.  Denies smoking history.  Denies exogenous use of estrogen.  HPI  Past Medical History:  Diagnosis Date  . Anemia    hx of  . Graves disease    Hyper thyroid  . H/O bronchitis   . Headache(784.0)    occasional  . Heart murmur    as child  . Prediabetes     Patient Active Problem List   Diagnosis Date Noted  . Graves disease 11/15/2013    Past Surgical History:  Procedure Laterality Date  . NO PAST SURGERIES    . THYROIDECTOMY Bilateral 11/15/2013   Procedure: TOTAL THYROIDECTOMY;  Surgeon: Earnstine Regal, MD;  Location: [facility];  Service: General;  Laterality: Bilateral;  . WISDOM TOOTH EXTRACTION  2012    OB History   No obstetric history on file.      Home Medications    Prior to Admission medications   Medication Sig Start Date End Date Taking? Authorizing Provider  levothyroxine (SYNTHROID, LEVOTHROID) 75 MCG tablet Take 150 mcg by mouth daily before breakfast.    Yes [provider]  Multiple Vitamin (MULTIVITAMIN WITH MINERALS) TABS tablet Take 1 tablet by mouth daily.   Yes [provider]  ibuprofen (ADVIL,MOTRIN) 600 MG tablet Take 1 tablet (600 mg total) by mouth every 6 (six) hours as needed. 12/28/16   Melynda Ripple, MD    Family  History Family History  Problem Relation Age of Onset  . Cancer Mother        Peripheral T cell Lymphoma  . Diabetes Father   . Diabetes Maternal Grandmother     Social History Social History   Tobacco Use  . Smoking status: Never Smoker  . Smokeless tobacco: Never Used  Substance Use Topics  . Alcohol use: Yes    Comment: social  . Drug use: No     Allergies   Patient has no known allergies.   Review of Systems Review of Systems  Constitutional: Negative for fatigue and fever.  Eyes: Negative for visual disturbance.  Respiratory: Negative for shortness of breath.   Cardiovascular: Negative for chest pain.  Gastrointestinal: Negative for abdominal pain, nausea and vomiting.  Musculoskeletal: Positive for arthralgias and myalgias. Negative for joint swelling.  Skin: Negative for color change, rash and wound.  Neurological: Negative for dizziness, weakness, light-headedness and headaches.     Physical Exam Triage Vital Signs ED Triage Vitals  Enc Vitals Group     BP 06/24/18 1225 100/64     Pulse Rate 06/24/18 1225 70     Resp 06/24/18 1225 18     Temp 06/24/18 1225 97.6 F (36.4 C)     Temp Source 06/24/18 1225 Oral  SpO2 06/24/18 1225 100 %     Weight --      Height --      Head Circumference --      Peak Flow --      Pain Score 06/24/18 1222 3     Pain Loc --      Pain Edu? --      Excl. in [place]? --    No data found.  Updated Vital Signs BP 100/64 (BP Location: Right Arm)   Pulse 70   Temp 97.6 F (36.4 C) (Oral)   Resp 18   LMP 06/17/2018   SpO2 100%   Visual Acuity Right Eye Distance:   Left Eye Distance:   Bilateral Distance:    Right Eye Near:   Left Eye Near:    Bilateral Near:     Physical Exam Vitals signs and nursing note reviewed.  Constitutional:      Appearance: She is well-developed.     Comments: No acute distress  HENT:     Head: Normocephalic and atraumatic.     Nose: Nose normal.  Eyes:     Conjunctiva/sclera:  Conjunctivae normal.  Neck:     Musculoskeletal: Neck supple.  Cardiovascular:     Rate and Rhythm: Normal rate.  Pulmonary:     Effort: Pulmonary effort is normal. No respiratory distress.  Abdominal:     General: There is no distension.  Musculoskeletal: Normal range of motion.     Comments: Right knee: No obvious swelling erythema or deformity, nontender over patella and medial lateral joint line, mild tenderness palpation of the popliteal area, tenderness does not extend into calf; full active range of motion of knee, no varus or valgus laxity appreciated, negative Lockman, negative McMurray  Calf without erythema, tenderness or swelling, negative Homans  Right ankle: No obvious swelling or erythema, nontender to medial lateral malleolus, mild tenderness to palpation over Achilles area bilaterally  Dorsalis pedis 2+  Skin:    General: Skin is warm and dry.  Neurological:     Mental Status: She is alert and oriented to person, place, and time.      UC Treatments / Results  Labs (all labs ordered are listed, but only abnormal results are displayed) Labs Reviewed - No data to display  EKG None  Radiology No results found.  Procedures Procedures (including critical care time)  Medications Ordered in UC Medications - No data to display  Initial Impression / Assessment and Plan / UC Course  I have reviewed the triage vital signs and the nursing notes.  Pertinent labs & imaging results that were available during my care of the patient were reviewed by me and considered in my medical decision making (see chart for details).     Patient with 3 to 4 days of knee and ankle pain, exam not suggestive of DVT, no significant injury, less likely bony abnormality.  Most likely inflammation, possible Achilles tendinopathy.  At this time will recommend anti-inflammatories, rest, avoiding jumping and significant bending motions.  Ice areas.  Follow-up if symptoms persisting,  progressing or worsening.  Follow-up if developing increased redness swelling or pain in the leg.Discussed strict return precautions. Patient verbalized understanding and is agreeable with plan.  Final Clinical Impressions(s) / UC Diagnoses   Final diagnoses:  Right leg pain     Discharge Instructions     Use anti-inflammatories for pain/swelling. You may take up to 800 mg Ibuprofen every 8 hours with food. You may supplement Ibuprofen with  Tylenol [phone] mg every 8 hours.   Follow up if developing increased redness, swelling or pain in right leg   ED Prescriptions    None     Controlled Substance Prescriptions [place] Controlled Substance Registry consulted? Not Applicable   Janith Lima, [place] 06/25/18 [phone]

## 2018-07-25 DIAGNOSIS — F330 Major depressive disorder, recurrent, mild: Secondary | ICD-10-CM | POA: Diagnosis not present

## 2018-09-20 DIAGNOSIS — E890 Postprocedural hypothyroidism: Secondary | ICD-10-CM | POA: Diagnosis not present

## 2018-09-20 DIAGNOSIS — R7301 Impaired fasting glucose: Secondary | ICD-10-CM | POA: Diagnosis not present

## 2018-09-26 DIAGNOSIS — Z30430 Encounter for insertion of intrauterine contraceptive device: Secondary | ICD-10-CM | POA: Diagnosis not present

## 2018-10-03 DIAGNOSIS — F330 Major depressive disorder, recurrent, mild: Secondary | ICD-10-CM | POA: Diagnosis not present

## 2018-10-18 DIAGNOSIS — F330 Major depressive disorder, recurrent, mild: Secondary | ICD-10-CM | POA: Diagnosis not present

## 2018-10-24 DIAGNOSIS — Z30431 Encounter for routine checking of intrauterine contraceptive device: Secondary | ICD-10-CM | POA: Diagnosis not present

## 2018-11-14 DIAGNOSIS — F330 Major depressive disorder, recurrent, mild: Secondary | ICD-10-CM | POA: Diagnosis not present

## 2018-11-28 DIAGNOSIS — F330 Major depressive disorder, recurrent, mild: Secondary | ICD-10-CM | POA: Diagnosis not present

## 2019-04-11 ENCOUNTER — Ambulatory Visit: Payer: Federal, State, Local not specified - PPO | Admitting: Family Medicine

## 2019-04-19 ENCOUNTER — Ambulatory Visit: Payer: Federal, State, Local not specified - PPO | Admitting: Family Medicine

## 2019-04-19 DIAGNOSIS — D649 Anemia, unspecified: Secondary | ICD-10-CM | POA: Diagnosis not present

## 2019-04-19 DIAGNOSIS — D696 Thrombocytopenia, unspecified: Secondary | ICD-10-CM | POA: Diagnosis not present

## 2019-04-19 DIAGNOSIS — D72829 Elevated white blood cell count, unspecified: Secondary | ICD-10-CM | POA: Diagnosis not present

## 2019-04-24 ENCOUNTER — Other Ambulatory Visit (HOSPITAL_COMMUNITY)
Admission: RE | Admit: 2019-04-24 | Discharge: 2019-04-24 | Disposition: A | Discharge status: Home / Self Care | Payer: Commercial Managed Care - PPO | Source: Ambulatory Visit | Attending: Oncology | Admitting: Oncology

## 2019-04-24 DIAGNOSIS — D72829 Elevated white blood cell count, unspecified: Secondary | ICD-10-CM | POA: Diagnosis not present

## 2019-04-24 DIAGNOSIS — D649 Anemia, unspecified: Secondary | ICD-10-CM | POA: Diagnosis not present

## 2019-04-24 DIAGNOSIS — D696 Thrombocytopenia, unspecified: Secondary | ICD-10-CM | POA: Diagnosis not present

## 2019-04-25 LAB — SURGICAL PATHOLOGY

## 2019-04-28 ENCOUNTER — Other Ambulatory Visit: Payer: Self-pay

## 2019-04-28 ENCOUNTER — Emergency Department (HOSPITAL_COMMUNITY)
Admission: EM | Admit: 2019-04-28 | Discharge: 2019-04-28 | Disposition: A | Discharge status: Home / Self Care | Payer: Commercial Managed Care - PPO | Attending: Emergency Medicine | Admitting: Emergency Medicine

## 2019-04-28 ENCOUNTER — Emergency Department (HOSPITAL_COMMUNITY): Payer: Commercial Managed Care - PPO

## 2019-04-28 ENCOUNTER — Encounter (HOSPITAL_COMMUNITY): Payer: Self-pay | Admitting: Emergency Medicine

## 2019-04-28 DIAGNOSIS — R1011 Right upper quadrant pain: Secondary | ICD-10-CM | POA: Diagnosis present

## 2019-04-28 DIAGNOSIS — Z79899 Other long term (current) drug therapy: Secondary | ICD-10-CM | POA: Insufficient documentation

## 2019-04-28 DIAGNOSIS — D649 Anemia, unspecified: Secondary | ICD-10-CM | POA: Diagnosis not present

## 2019-04-28 LAB — URINALYSIS, ROUTINE W REFLEX MICROSCOPIC
Bilirubin Urine: NEGATIVE
Glucose, UA: NEGATIVE mg/dL
Hgb urine dipstick: NEGATIVE
Ketones, ur: NEGATIVE mg/dL
Leukocytes,Ua: NEGATIVE
Nitrite: NEGATIVE
Protein, ur: NEGATIVE mg/dL
Specific Gravity, Urine: 1.024 (ref 1.005–1.030)
pH: 5 (ref 5.0–8.0)

## 2019-04-28 LAB — COMPREHENSIVE METABOLIC PANEL
ALT: 20 U/L (ref 0–44)
AST: 40 U/L (ref 15–41)
Albumin: 3.6 g/dL (ref 3.5–5.0)
Alkaline Phosphatase: 115 U/L (ref 38–126)
Anion gap: 13 (ref 5–15)
BUN: 10 mg/dL (ref 6–20)
CO2: 21 mmol/L — ABNORMAL LOW (ref 22–32)
Calcium: 10.2 mg/dL (ref 8.9–10.3)
Chloride: 103 mmol/L (ref 98–111)
Creatinine, Ser: 0.9 mg/dL (ref 0.44–1.00)
GFR calc Af Amer: >60 mL/min (ref >60)
GFR calc non Af Amer: >60 mL/min (ref >60)
Glucose, Bld: 127 mg/dL — ABNORMAL HIGH (ref 70–99)
Potassium: 3.8 mmol/L (ref 3.5–5.1)
Sodium: 137 mmol/L (ref 135–145)
Total Bilirubin: 1 mg/dL (ref 0.3–1.2)
Total Protein: 7.6 g/dL (ref 6.5–8.1)

## 2019-04-28 LAB — I-STAT BETA HCG BLOOD, ED (MC, WL, AP ONLY): I-stat hCG, quantitative: <5 m[IU]/mL (ref <5)

## 2019-04-28 LAB — CBC
HCT: 28.8 % — ABNORMAL LOW (ref 36.0–46.0)
Hemoglobin: 8.4 g/dL — ABNORMAL LOW (ref 12.0–15.0)
MCH: 28.4 pg (ref 26.0–34.0)
MCHC: 29.2 g/dL — ABNORMAL LOW (ref 30.0–36.0)
MCV: 97.3 fL (ref 80.0–100.0)
Platelets: DECREASED 10*3/uL (ref 150–400)
RBC: 2.96 MIL/uL — ABNORMAL LOW (ref 3.87–5.11)
RDW: 22.5 % — ABNORMAL HIGH (ref 11.5–15.5)
WBC: 11.6 10*3/uL — ABNORMAL HIGH (ref 4.0–10.5)
nRBC: 67 % — ABNORMAL HIGH (ref 0.0–0.2)

## 2019-04-28 LAB — LIPASE, BLOOD: Lipase: 23 U/L (ref 11–51)

## 2019-04-28 IMAGING — US US ABDOMEN LIMITED
1 series · 14 of 25 positions shown · non-contrast
Comparison: None.

CLINICAL DATA: Right upper quadrant abdominal pain.

EXAM:
ULTRASOUND ABDOMEN LIMITED RIGHT UPPER QUADRANT

[Series 1: us abdomen limited · 14 of 42 slices shown]
[im 1/42]
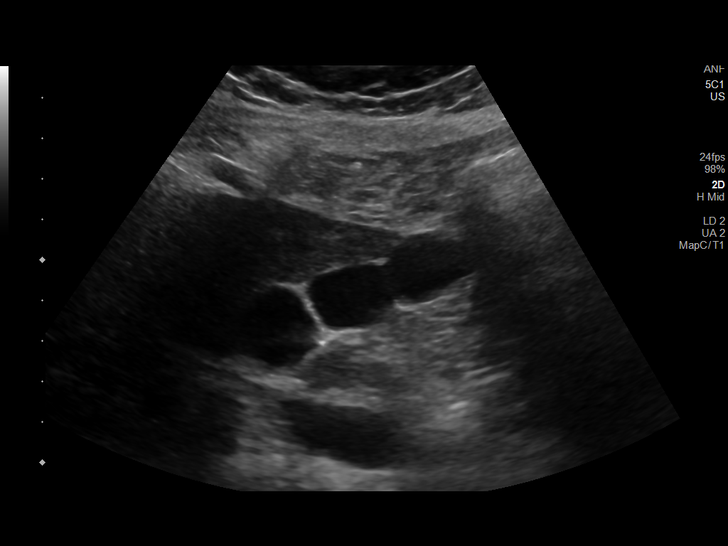
[im 4/42]
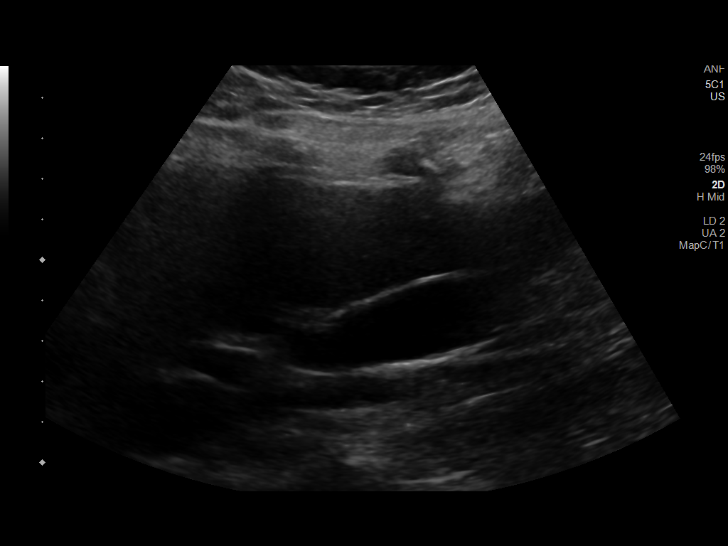
[im 7/42]
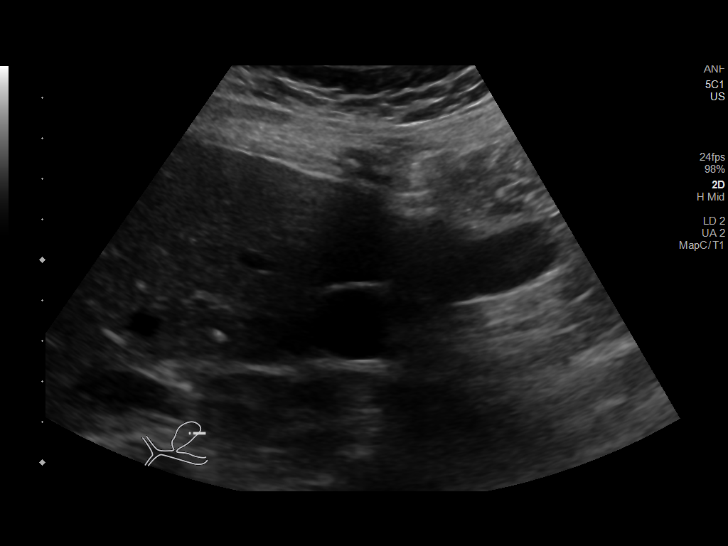
[im 11/42]
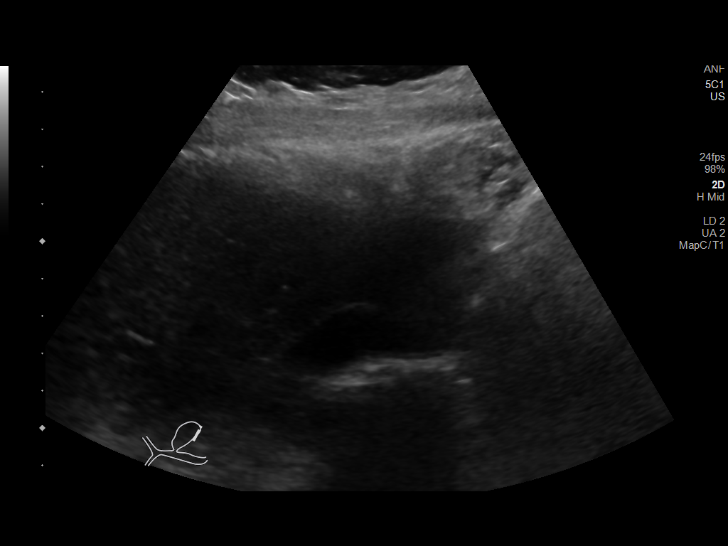
[im 14/42]
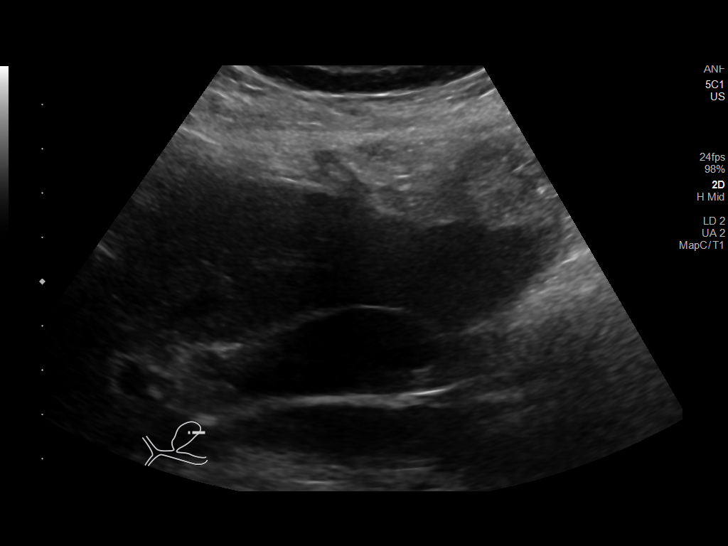
[im 16/42]
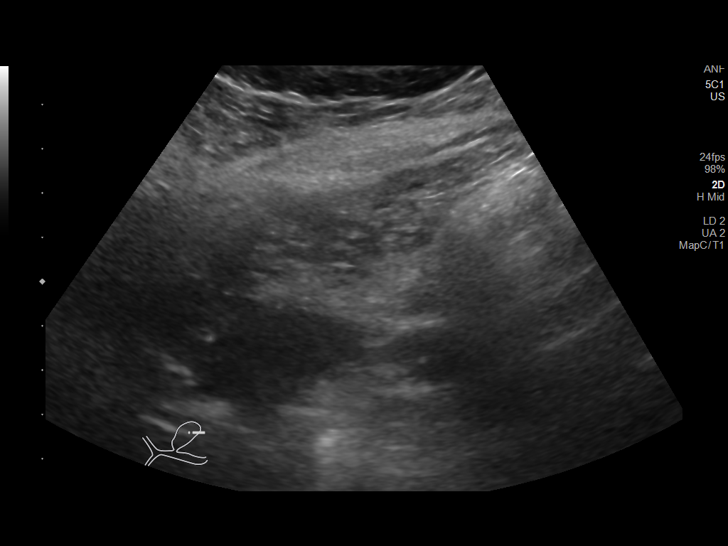
[im 19/42]
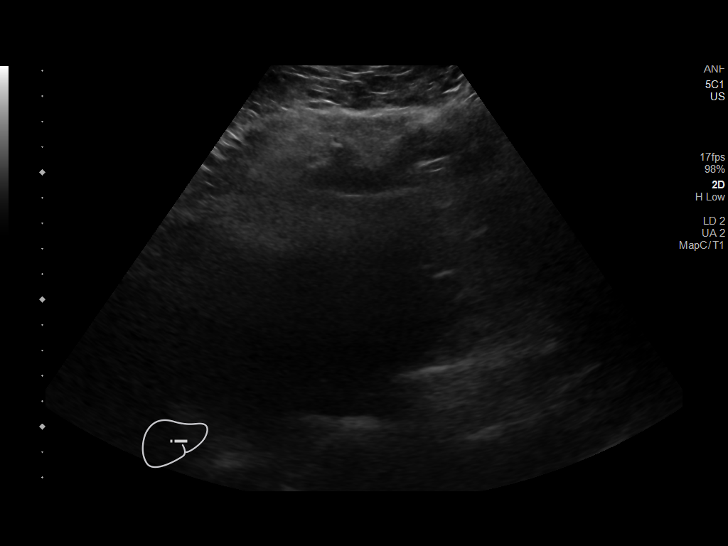
[im 23/42]
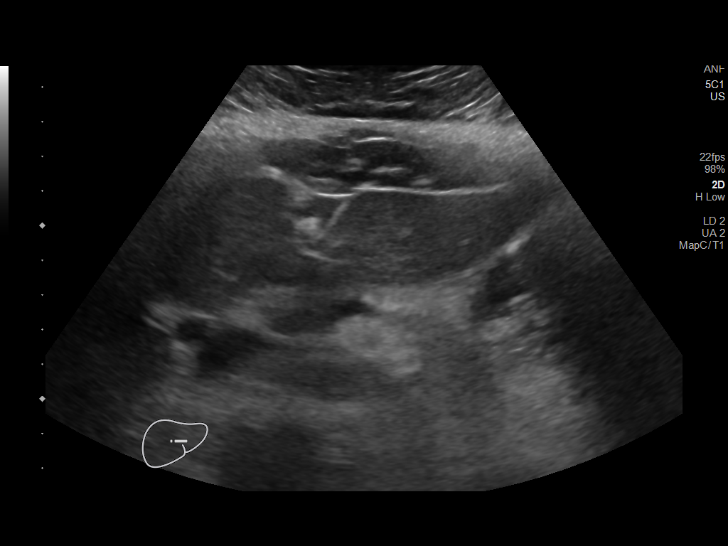
[im 26/42]
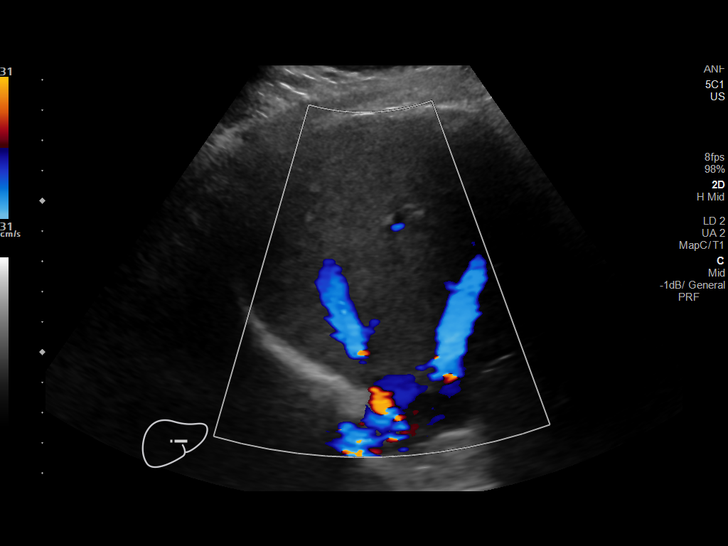
[im 28/42]
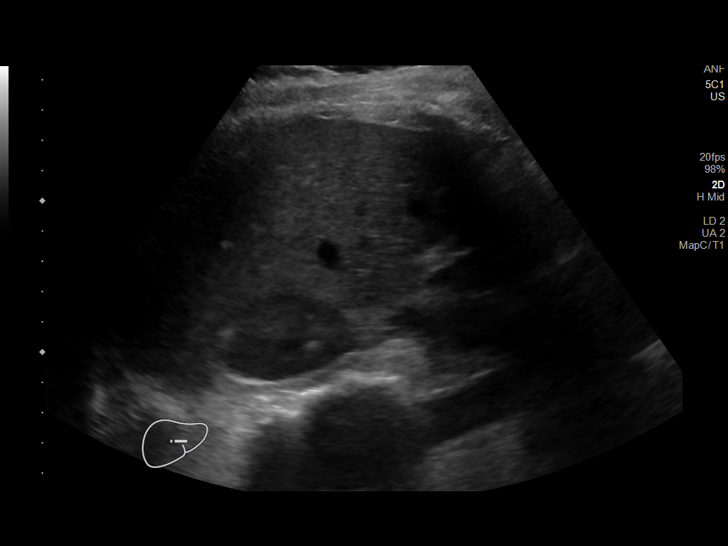
[im 31/42]
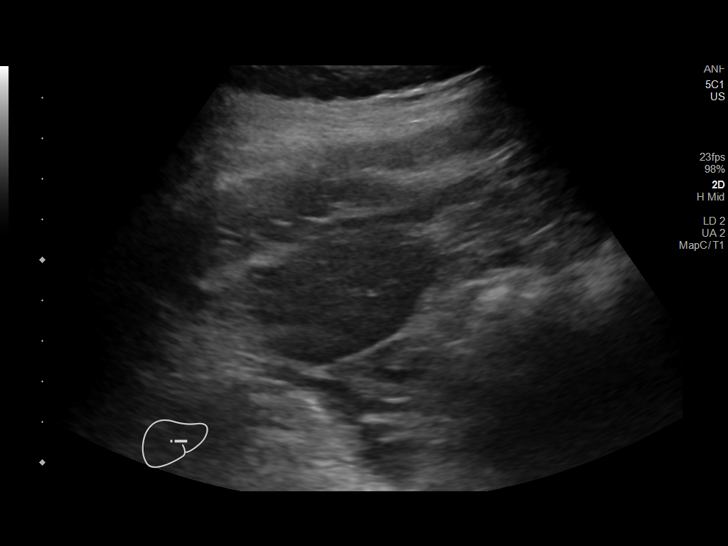
[im 35/42]
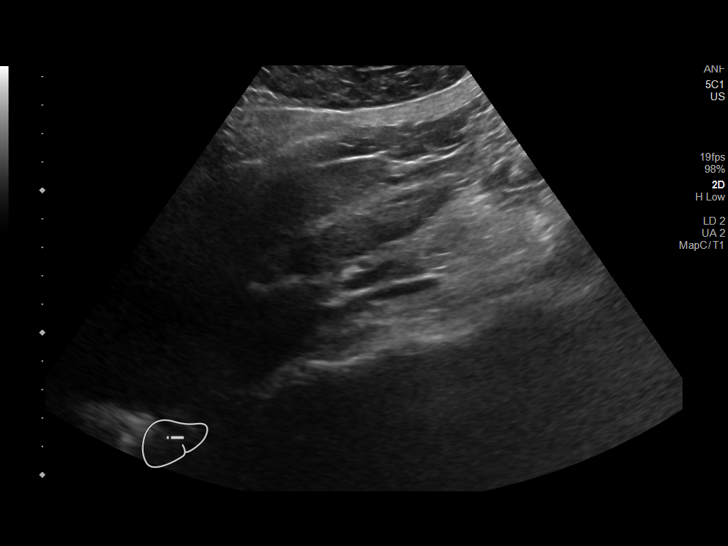
[im 38/42]
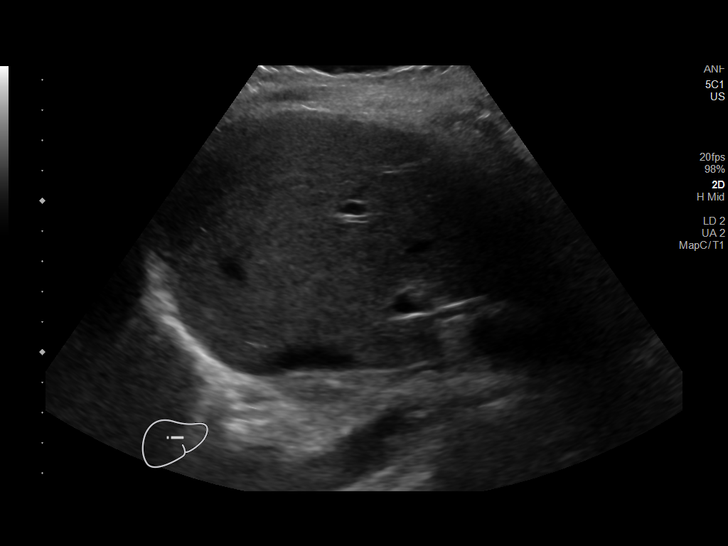
[im 42/42]
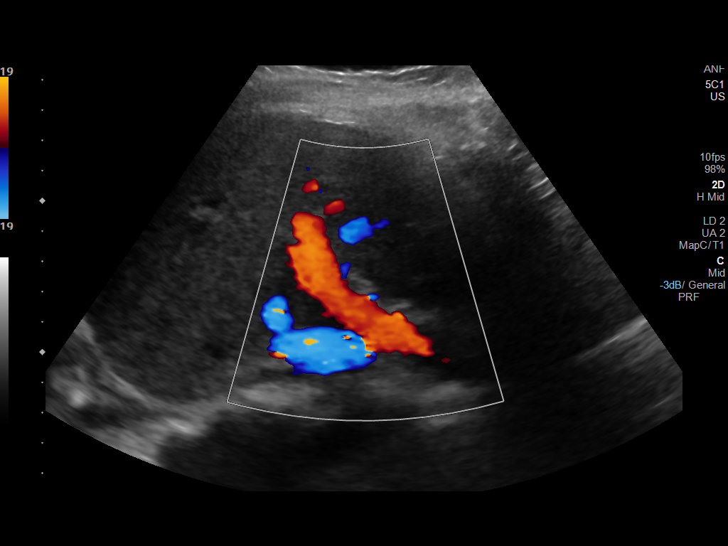

[14 of 25 positions shown; findings below may reference images not displayed]

FINDINGS: Gallbladder:

No gallstones or wall thickening visualized. No sonographic Murphy
sign noted by sonographer.

Common bile duct:

Diameter: 2 mm

Liver:

No focal lesion identified. Within normal limits in parenchymal
echogenicity. Portal vein is patent on color Doppler imaging with
normal direction of blood flow towards the liver.

Other: None.
IMPRESSION: No cholelithiasis or secondary signs to suggest acute cholecystitis.

## 2019-04-28 MED ORDER — SODIUM CHLORIDE 0.9% FLUSH: 3.0000 mL | Freq: Once | INTRAVENOUS | Status: DC

## 2019-04-28 MED ORDER — FENTANYL CITRATE (PF) 100 MCG/2ML IJ SOLN
25.0000 ug | Freq: Once | INTRAMUSCULAR | Status: AC
  Administered 2019-04-28: 25 ug via INTRAVENOUS
  Filled 2019-04-28: qty 2

## 2019-04-28 MED ORDER — ONDANSETRON HCL 4 MG/2ML IJ SOLN
4.0000 mg | Freq: Once | INTRAMUSCULAR | Status: AC
  Administered 2019-04-28: 4 mg via INTRAVENOUS
  Filled 2019-04-28: qty 2

## 2019-04-28 MED ORDER — ONDANSETRON 4 MG PO TBDP: 4.0000 mg | ORAL_TABLET | Freq: Three times a day (TID) | ORAL | 0 refills | Status: DC | PRN

## 2019-04-28 MED ORDER — HYDROCODONE-ACETAMINOPHEN 5-325 MG PO TABS: 1.0000 | ORAL_TABLET | Freq: Four times a day (QID) | ORAL | 0 refills | Status: AC | PRN

## 2019-04-28 MED ORDER — SODIUM CHLORIDE 0.9 % IV BOLUS
500.0000 mL | Freq: Once | INTRAVENOUS | Status: AC
  Administered 2019-04-28: 500 mL via INTRAVENOUS

## 2019-04-28 NOTE — ED Provider Notes (Signed)
[facility] EMERGENCY DEPARTMENT Provider Note   CSN: [id number] Arrival date & time: 04/28/19  1159     History   Chief Complaint Chief Complaint  Patient presents with   Abdominal Pain    HPI [name] [name] is a 38 y.o. female.     HPI Patient presents with concern of abdominal pain, nausea, vomiting Patient has a history of anemia for which she is currently undergoing evaluation. However, she has been in her usual state of health till the past 36 hours or so. During that time she has developed severe pain in the right upper quadrant, with associated anorexia, nausea, vomiting.   There has been some transient left upper abdominal pain, though none currently. Pain is sharp, severe, not improved with tramadol.  She does have a history of prior appendicitis, but still has her gallbladder.  Past Medical History:  Diagnosis Date   Anemia    hx of   Graves disease    Hyper thyroid   H/O bronchitis    Headache(784.0)    occasional   Heart murmur    as child   Prediabetes     Patient Active Problem List   Diagnosis Date Noted   Graves disease 11/15/2013    Past Surgical History:  Procedure Laterality Date   NO PAST SURGERIES     THYROIDECTOMY Bilateral 11/15/2013   Procedure: TOTAL THYROIDECTOMY;  Surgeon: Earnstine Regal, MD;  Location: [facility];  Service: General;  Laterality: Bilateral;   WISDOM TOOTH EXTRACTION  2012     OB History   No obstetric history on file.      Home Medications    Prior to Admission medications   Medication Sig Start Date End Date Taking? Authorizing Provider  levothyroxine (SYNTHROID) 125 MCG tablet Take 125 mcg by mouth every morning. 04/10/19  Yes [provider]  Multiple Vitamin (MULTIVITAMIN WITH MINERALS) TABS tablet Take 1 tablet by mouth daily.   Yes [provider]  traMADol (ULTRAM) 50 MG tablet Take 100 mg by mouth every 6 (six) hours as needed for pain. 04/24/19  Yes  [provider]  ibuprofen (ADVIL,MOTRIN) 600 MG tablet Take 1 tablet (600 mg total) by mouth every 6 (six) hours as needed. Patient not taking: Reported on 04/28/2019 12/28/16   Melynda Ripple, MD    Family History Family History  Problem Relation Age of Onset   Cancer Mother        Peripheral T cell Lymphoma   Diabetes Father    Diabetes Maternal Grandmother     Social History Social History   Tobacco Use   Smoking status: Never Smoker   Smokeless tobacco: Never Used  Substance Use Topics   Alcohol use: Yes    Comment: social   Drug use: No     Allergies   Patient has no known allergies.   Review of Systems Review of Systems  Constitutional:       Per HPI, otherwise negative  HENT:       Per HPI, otherwise negative  Respiratory:       Per HPI, otherwise negative  Cardiovascular:       Per HPI, otherwise negative  Gastrointestinal: Positive for abdominal pain, nausea and vomiting.  Endocrine:       Negative aside from HPI  Genitourinary:       Neg aside from HPI   Musculoskeletal:       Per HPI, otherwise negative  Skin: Negative.   Neurological:  Negative for syncope.     Physical Exam Updated Vital Signs BP 111/77    Pulse 79    Temp 97.9 F (36.6 C)    Resp 16    Ht 5' 5"  (1.651 m)    Wt 79.4 kg    LMP 10/26/2018    SpO2 95%    BMI 29.12 kg/m   Physical Exam Vitals signs and nursing note reviewed.  Constitutional:      General: She is not in acute distress.    Appearance: She is well-developed.     Comments: Adult female sitting upright crying  HENT:     Head: Normocephalic and atraumatic.  Eyes:     Conjunctiva/sclera: Conjunctivae normal.  Cardiovascular:     Rate and Rhythm: Normal rate and regular rhythm.  Pulmonary:     Effort: Pulmonary effort is normal. No respiratory distress.     Breath sounds: Normal breath sounds. No stridor.  Abdominal:     General: There is no distension.     Tenderness: There is abdominal  tenderness in the right upper quadrant and epigastric area.  Skin:    General: Skin is warm and dry.  Neurological:     Mental Status: She is alert and oriented to person, place, and time.     Cranial Nerves: No cranial nerve deficit.      ED Treatments / Results  Labs (all labs ordered are listed, but only abnormal results are displayed) Labs Reviewed  COMPREHENSIVE METABOLIC PANEL - Abnormal; Notable for the following components:      Result Value   CO2 21 (*)    Glucose, Bld 127 (*)    All other components within normal limits  CBC - Abnormal; Notable for the following components:   WBC 11.6 (*)    RBC 2.96 (*)    Hemoglobin 8.4 (*)    HCT 28.8 (*)    MCHC 29.2 (*)    RDW 22.5 (*)    nRBC 67.0 (*)    All other components within normal limits  URINALYSIS, ROUTINE W REFLEX MICROSCOPIC - Abnormal; Notable for the following components:   APPearance HAZY (*)    All other components within normal limits  LIPASE, BLOOD  PATHOLOGIST SMEAR REVIEW  I-STAT BETA HCG BLOOD, ED ([facility], [facility], [facility] ONLY)    EKG EKG Interpretation  Date/Time:  Saturday April 28 2019 12:10:47 EST Ventricular Rate:  82 PR Interval:  142 QRS Duration: 76 QT Interval:  398 QTC Calculation: 464 R Axis:   53 Text Interpretation: Normal sinus rhythm Artifact Otherwise within normal limits Confirmed by Carmin Muskrat [phone]) on 04/28/2019 12:48:23 PM   Radiology US Abdomen Limited Ruq  Result Date: 04/28/2019 CLINICAL DATA:  Right upper quadrant abdominal pain. EXAM: ULTRASOUND ABDOMEN LIMITED RIGHT UPPER QUADRANT COMPARISON:  None. FINDINGS: Gallbladder: No gallstones or wall thickening visualized. No sonographic Murphy sign noted by sonographer. Common bile duct: Diameter: 2 mm Liver: No focal lesion identified. Within normal limits in parenchymal echogenicity. Portal vein is patent on color Doppler imaging with normal direction of blood flow towards the liver. Other: None. IMPRESSION: No cholelithiasis  or secondary signs to suggest acute cholecystitis. Electronically Signed   By: Lovey Newcomer M.D.   On: 04/28/2019 14:00    Procedures Procedures (including critical care time)  Medications Ordered in ED Medications  fentaNYL (SUBLIMAZE) injection 25 mcg (25 mcg Intravenous Given 04/28/19 1436)  sodium chloride 0.9 % bolus 500 mL (500 mLs Intravenous New Bag/Given 04/28/19 1435)  ondansetron (ZOFRAN) injection 4 mg (4 mg Intravenous Given 04/28/19 1436)     Initial Impression / Assessment and Plan / ED Course  I have reviewed the triage vital signs and the nursing notes.  Pertinent labs & imaging results that were available during my care of the patient were reviewed by me and considered in my medical decision making (see chart for details).    I was able to review CT scan from earlier this week at outside hospital, with results as below: IMPRESSION: 1. Diffusely sclerotic osseous structures with multiple lytic lesions noted throughout the axial skeleton, for example in the right iliac wing (series 2, image 90) in the T6 vertebral body (series 602, image 65). Some lesions demonstrate expansile soft tissue components, for example the anterior left fifth rib (series 2, image 32). Findings are highly concerning for multiple myeloma with differential consideration of other osseous metastatic disease of uncertain origin.  2. Normal spleen.  3. There is a prominent, somewhat spiculated appearing mass or tissue element in the lateral right breast, 9 o'clock face (series 2, image 25). Correlate with mammographic evaluation.  4. Trace bilateral pleural effusions. Nonspecific bandlike atelectasis or consolidation of the left lung base.  These results will be called to the ordering clinician or representative by the Radiologist Assistant, and communication documented in the PACS or zVision Dashboard.   Electronically Signed By: Eddie Candle M.D. On: 04/25/2019 10:22    I discussed  the patient's ultrasound, and prior CT with our radiologist given concern for right upper quadrant pain, consideration of possible thrombotic processes given her abnormal platelet counts. No evidence for portal vein thrombosis or other obvious occlusion.  Discussed the patient's lab results with our laboratory.  Pathology smear will be available in about 48 hours.  3:01 PM Patient awake, alert, improved symptoms following fentanyl, Zofran, fluids.  Discussed all findings from today, and CT result from earlier in the week.  We discussed possibilities including lytic lesions of her skeletal system, dysplastic hematologic processes, need for ongoing close outpatient follow-up She confirms that she has a relationship with a physician she is seen this week in follow-up for last week's studies, will discuss today's findings, and outstanding pathology smear.  She has not hemodynamically unstable has improved pain, no evidence for exsanguination, close outpatient follow-up capacity for ongoing anemia, pain, low suspicion for either malignancy, versus other dysplasia.  Patient discharged in stable condition, with ongoing analgesia, antiemetics, follow-up as above.  Final Clinical Impressions(s) / ED Diagnoses   Final diagnoses:  Right upper quadrant abdominal pain  Anemia, unspecified type    ED Discharge Orders         Ordered    HYDROcodone-acetaminophen (NORCO/VICODIN) 5-325 MG tablet  Every 6 hours PRN     04/28/19 1507    ondansetron (ZOFRAN ODT) 4 MG disintegrating tablet  Every 8 hours PRN     04/28/19 1507           Carmin Muskrat, MD 04/28/19 [phone]

## 2019-04-28 NOTE — ED Notes (Signed)
Patient verbalizes understanding of discharge instructions. Opportunity for questioning and answers were provided. Armband removed by staff, pt discharged from ED.  

## 2019-04-28 NOTE — ED Triage Notes (Signed)
Pt reports since last night has been having right upper abdominal spasms. Pt reports continued shortness of breath that she is being follow by for low hgb. Pt crying, guarding RUQ abdomen. Denies N/V/D/

## 2019-04-28 NOTE — Discharge Instructions (Addendum)
As discussed, it is very important that you follow-up with your physician this week.  Please discuss last weeks CT scans, today's ultrasound and your ongoing blood work.  Return here for concerning changes in your condition.

## 2019-05-01 DIAGNOSIS — C50919 Malignant neoplasm of unspecified site of unspecified female breast: Secondary | ICD-10-CM

## 2019-05-01 HISTORY — DX: Malignant neoplasm of unspecified site of unspecified female breast: C50.919

## 2019-05-01 LAB — PATHOLOGIST SMEAR REVIEW

## 2019-05-02 DIAGNOSIS — N6315 Unspecified lump in the right breast, overlapping quadrants: Secondary | ICD-10-CM | POA: Diagnosis not present

## 2019-05-02 DIAGNOSIS — R937 Abnormal findings on diagnostic imaging of other parts of musculoskeletal system: Secondary | ICD-10-CM | POA: Diagnosis not present

## 2019-05-02 DIAGNOSIS — J90 Pleural effusion, not elsewhere classified: Secondary | ICD-10-CM | POA: Diagnosis not present

## 2019-05-08 ENCOUNTER — Other Ambulatory Visit (HOSPITAL_COMMUNITY)
Admission: RE | Admit: 2019-05-08 | Discharge: 2019-05-08 | Disposition: A | Discharge status: Home / Self Care | Payer: Commercial Managed Care - PPO | Source: Ambulatory Visit | Attending: Oncology | Admitting: Oncology

## 2019-05-08 DIAGNOSIS — D649 Anemia, unspecified: Secondary | ICD-10-CM | POA: Insufficient documentation

## 2019-05-08 DIAGNOSIS — D72829 Elevated white blood cell count, unspecified: Secondary | ICD-10-CM | POA: Insufficient documentation

## 2019-05-08 DIAGNOSIS — C50811 Malignant neoplasm of overlapping sites of right female breast: Secondary | ICD-10-CM | POA: Diagnosis not present

## 2019-05-08 DIAGNOSIS — D696 Thrombocytopenia, unspecified: Secondary | ICD-10-CM | POA: Insufficient documentation

## 2019-05-09 ENCOUNTER — Other Ambulatory Visit: Payer: Self-pay

## 2019-05-09 ENCOUNTER — Ambulatory Visit: Payer: Self-pay | Admitting: Family Medicine

## 2019-05-10 LAB — SURGICAL PATHOLOGY

## 2019-05-11 ENCOUNTER — Other Ambulatory Visit: Payer: Self-pay | Admitting: Oncology

## 2019-05-11 DIAGNOSIS — R2231 Localized swelling, mass and lump, right upper limb: Secondary | ICD-10-CM

## 2019-05-11 DIAGNOSIS — N6310 Unspecified lump in the right breast, unspecified quadrant: Secondary | ICD-10-CM

## 2019-05-11 DIAGNOSIS — D72829 Elevated white blood cell count, unspecified: Secondary | ICD-10-CM | POA: Diagnosis not present

## 2019-05-11 DIAGNOSIS — R921 Mammographic calcification found on diagnostic imaging of breast: Secondary | ICD-10-CM

## 2019-05-16 ENCOUNTER — Encounter (HOSPITAL_COMMUNITY): Payer: Self-pay | Admitting: Oncology

## 2019-05-16 NOTE — Progress Notes (Signed)
These preliminary result these preliminary results were noted.  Awaiting final report.

## 2019-05-18 ENCOUNTER — Ambulatory Visit
Admission: RE | Admit: 2019-05-18 | Discharge: 2019-05-18 | Disposition: A | Discharge status: Home / Self Care | Payer: Commercial Managed Care - PPO | Source: Ambulatory Visit | Attending: Radiation Oncology | Admitting: Radiation Oncology

## 2019-05-18 ENCOUNTER — Other Ambulatory Visit: Payer: Self-pay | Admitting: Oncology

## 2019-05-18 ENCOUNTER — Other Ambulatory Visit: Payer: Self-pay

## 2019-05-18 DIAGNOSIS — N6310 Unspecified lump in the right breast, unspecified quadrant: Secondary | ICD-10-CM

## 2019-05-18 DIAGNOSIS — R2231 Localized swelling, mass and lump, right upper limb: Secondary | ICD-10-CM

## 2019-05-18 DIAGNOSIS — R921 Mammographic calcification found on diagnostic imaging of breast: Secondary | ICD-10-CM

## 2019-05-18 IMAGING — US US  BREAST BX W/ LOC DEV 1ST LESION IMG BX SPEC US GUIDE*R*
1 series · 16 of 19 positions shown · non-contrast
Comparison: Previous exam(s).
COMPARISON: Previous exam(s).

Addendum:
CLINICAL DATA: Patient presents for ultrasound-guided core needle
biopsy of a 9 mm mass in the upper outer right breast, as well as 1
of several abnormal lymph nodes in the right axilla. She is also
scheduled undergo stereotactic core needle biopsy of right breast
calcifications.

EXAM:
ULTRASOUND GUIDED RIGHT BREAST CORE NEEDLE BIOPSY

[Series 1: us breast bx w/ loc dev 1st lesion img bx spec us  · 0.06mm/px · 16 of 19 slices shown]
[im 1/19]
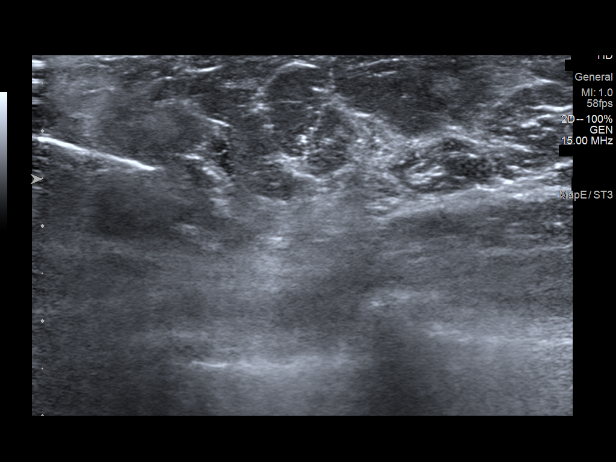
[im 2/19]
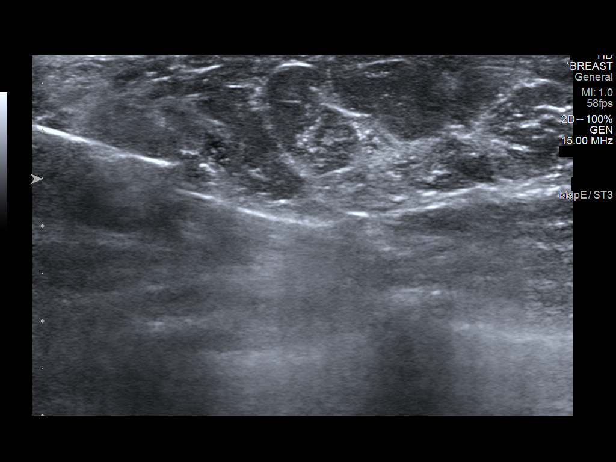
[im 3/19]
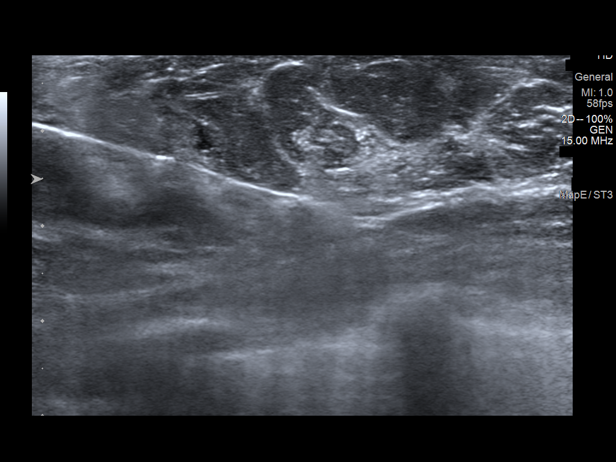
[im 5/19]
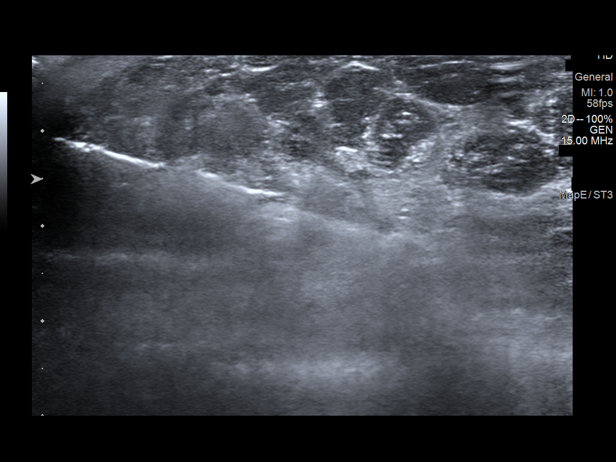
[im 6/19]
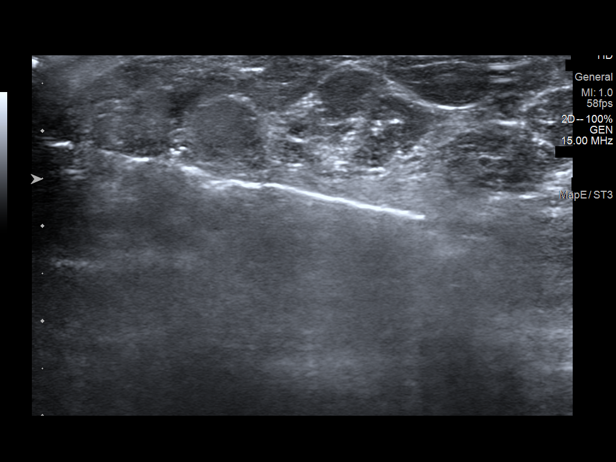
[im 7/19]
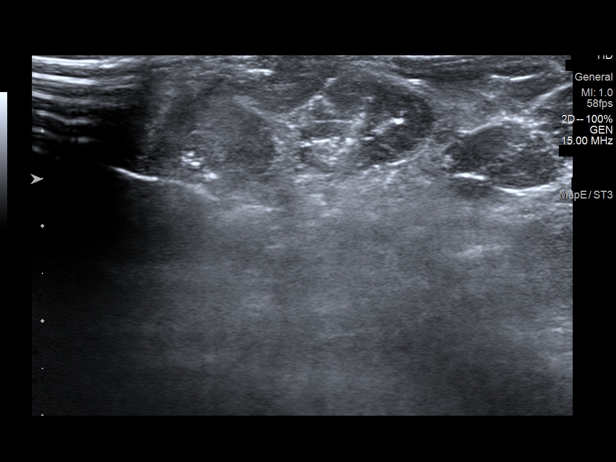
[im 8/19]
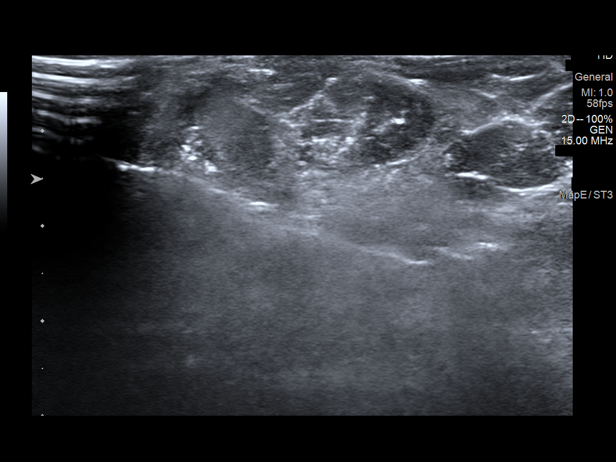
[im 9/19]
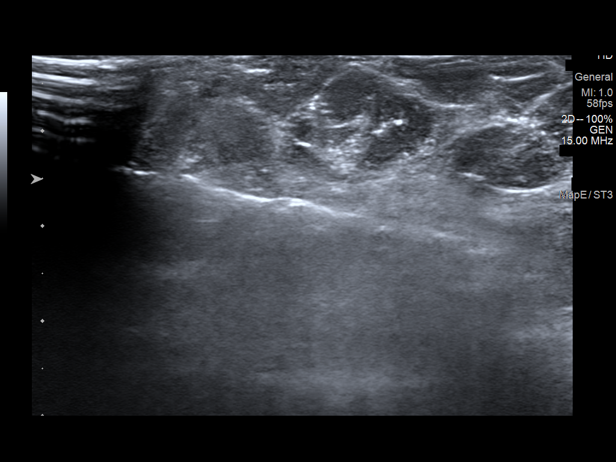
[im 11/19]
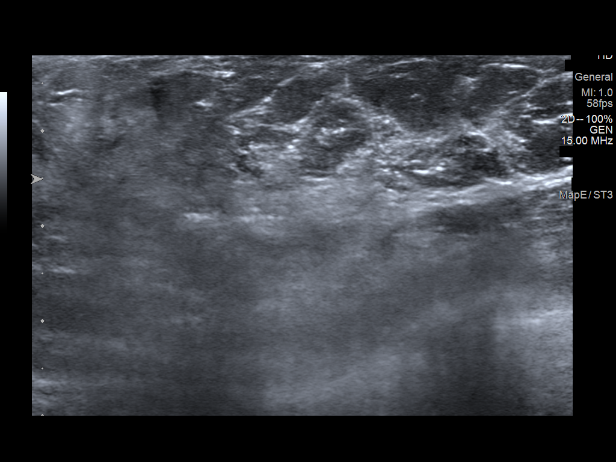
[im 12/19]
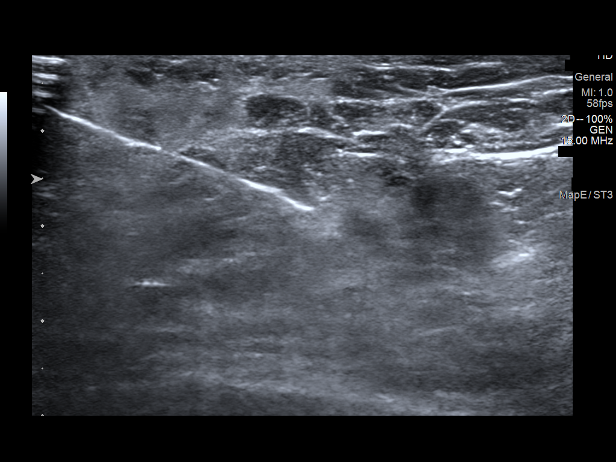
[im 13/19]
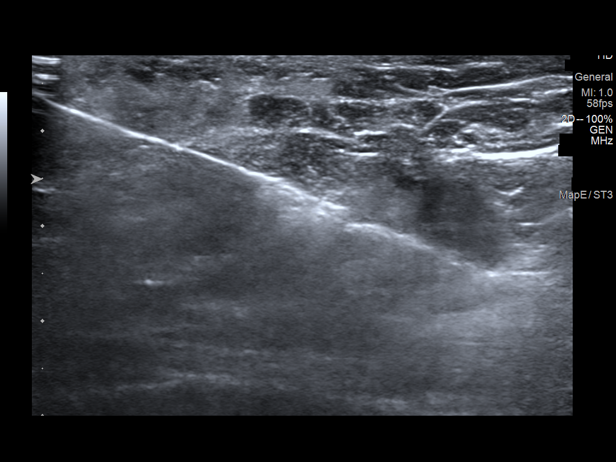
[im 14/19]
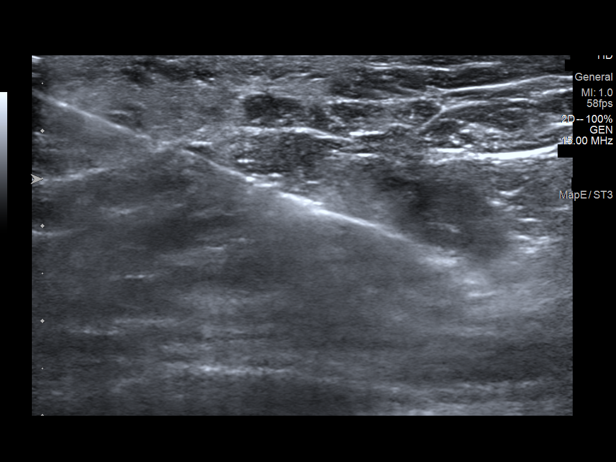
[im 15/19]
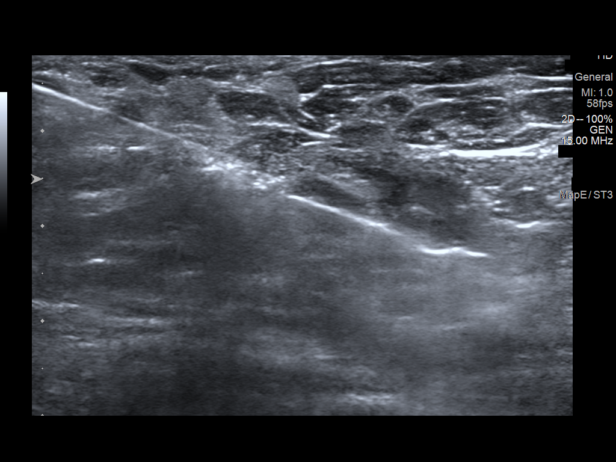
[im 17/19]
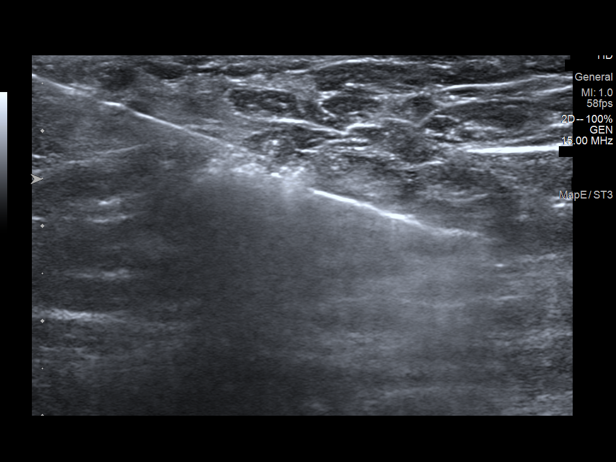
[im 18/19]
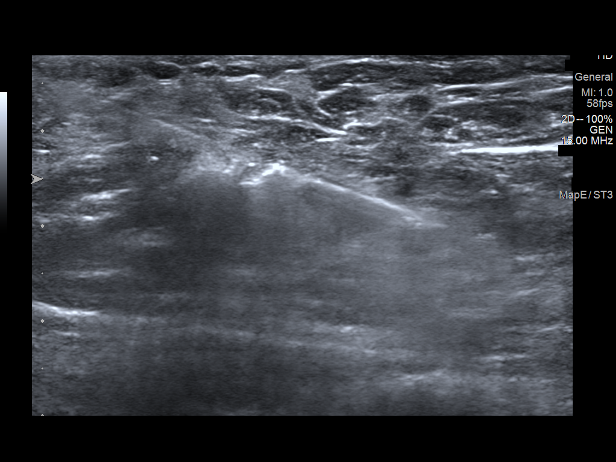
[im 19/19]
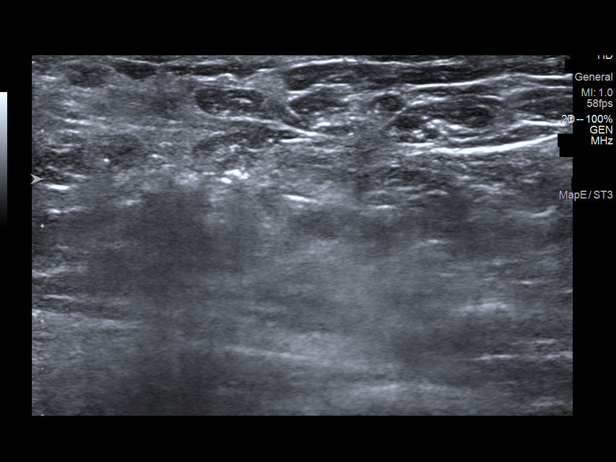

[16 of 19 positions shown; findings below may reference images not displayed]



Lesion #1 9 mm mass.

Lesion quadrant: Upper outer quadrant: 10 o'clock, 8 cm from the
nipple.

Using sterile technique and 1% Lidocaine as local anesthetic, under
direct ultrasound visualization, a 12 gauge GENG device was
used to perform biopsy of irregular hypoechoic mass at 10 o'clock
using an inferolateral approach. At the conclusion of the procedure
ribbon shaped tissue marker clip was deployed into the biopsy
cavity.

Lesion #2 axillary lymph node.

Using sterile technique and 1% Lidocaine as local anesthetic, under
direct ultrasound visualization, a 14 gauge GENG device was
used to perform biopsy of 1 of the lymph nodes with a thickened
cortex using a inferolateral approach. At the conclusion of the
procedure HydroMARK tissue marker clip was deployed into the biopsy
cavity.

Follow up 2 view mammogram was performed and dictated separately.
IMPRESSION: Ultrasound guided biopsy of a 10 o'clock position right breast mass
and 1 abnormal right axillary lymph node. Patient will undergo
stereotactic core needle biopsy of the more anterior extent of right
breast calcifications following this procedure. No apparent
complications.

ADDENDUM:
Pathology revealed GRADE II INVASIVE DUCTAL CARCINOMA. DUCTAL
CARCINOMA IN SITU of the RIGHT breast, 10 o'clock. This was found to
be concordant by Dr. GENG.

Pathology revealed METASTATIC CARCINOMA INVOLVING ONE LYMPH NODE of
the RIGHT axilla. This was found to be concordant by Dr. GENG
GENG.

Pathology revealed MAMMARY CARCINOMA of the RIGHT breast
calcifications, anterior, upper outer quadrant. The biopsy has
mammary carcinoma in situ with foci concerning for invasion. This
was found to be concordant by Dr. GENG.

Pathology results were discussed with the patient by telephone. The
patient reported doing well after the biopsies with tenderness at
the sites. Post biopsy instructions and care were reviewed and
questions were answered. The patient was encouraged to call The
the request of the patient, Dr. [REDACTED] Oncologist was
notified via [REDACTED] in-basket of these results.

Per patient request, surgical consultation has been arranged with
Dr. GENG at [REDACTED] on [DATE]. Breast MRI is recommended to determine stage of disease.

Pathology results reported by GENG, RN on [DATE].



Lesion #1 9 mm mass.

Lesion quadrant: Upper outer quadrant: 10 o'clock, 8 cm from the
nipple.

Using sterile technique and 1% Lidocaine as local anesthetic, under
direct ultrasound visualization, a 12 gauge GENG device was
used to perform biopsy of irregular hypoechoic mass at 10 o'clock
using an inferolateral approach. At the conclusion of the procedure
ribbon shaped tissue marker clip was deployed into the biopsy
cavity.

Lesion #2 axillary lymph node.

Using sterile technique and 1% Lidocaine as local anesthetic, under
direct ultrasound visualization, a 14 gauge GENG device was
used to perform biopsy of 1 of the lymph nodes with a thickened
cortex using a inferolateral approach. At the conclusion of the
procedure HydroMARK tissue marker clip was deployed into the biopsy
cavity.

Follow up 2 view mammogram was performed and dictated separately.
IMPRESSION: Ultrasound guided biopsy of a 10 o'clock position right breast mass
and 1 abnormal right axillary lymph node. Patient will undergo
stereotactic core needle biopsy of the more anterior extent of right
breast calcifications following this procedure. No apparent
complications.

## 2019-05-18 IMAGING — MG MM BREAST LOCALIZATION CLIP
4 series · 4 of 12 positions shown · non-contrast
Comparison: Previous exam(s).

CLINICAL DATA: Status post ultrasound-guided core needle biopsy of
a 10 o'clock position, 8 cm from the nipple, right breast mass, an
abnormal right axillary lymph node as well as stereotactic core
needle biopsy of the more anterior extent of abnormal right breast
calcifications. Evaluate post biopsy marker clip placements.

EXAM:
DIAGNOSTIC RIGHT MAMMOGRAM POST ULTRASOUND AND STEREOTACTIC BIOPSY

[R CC synth-2D]
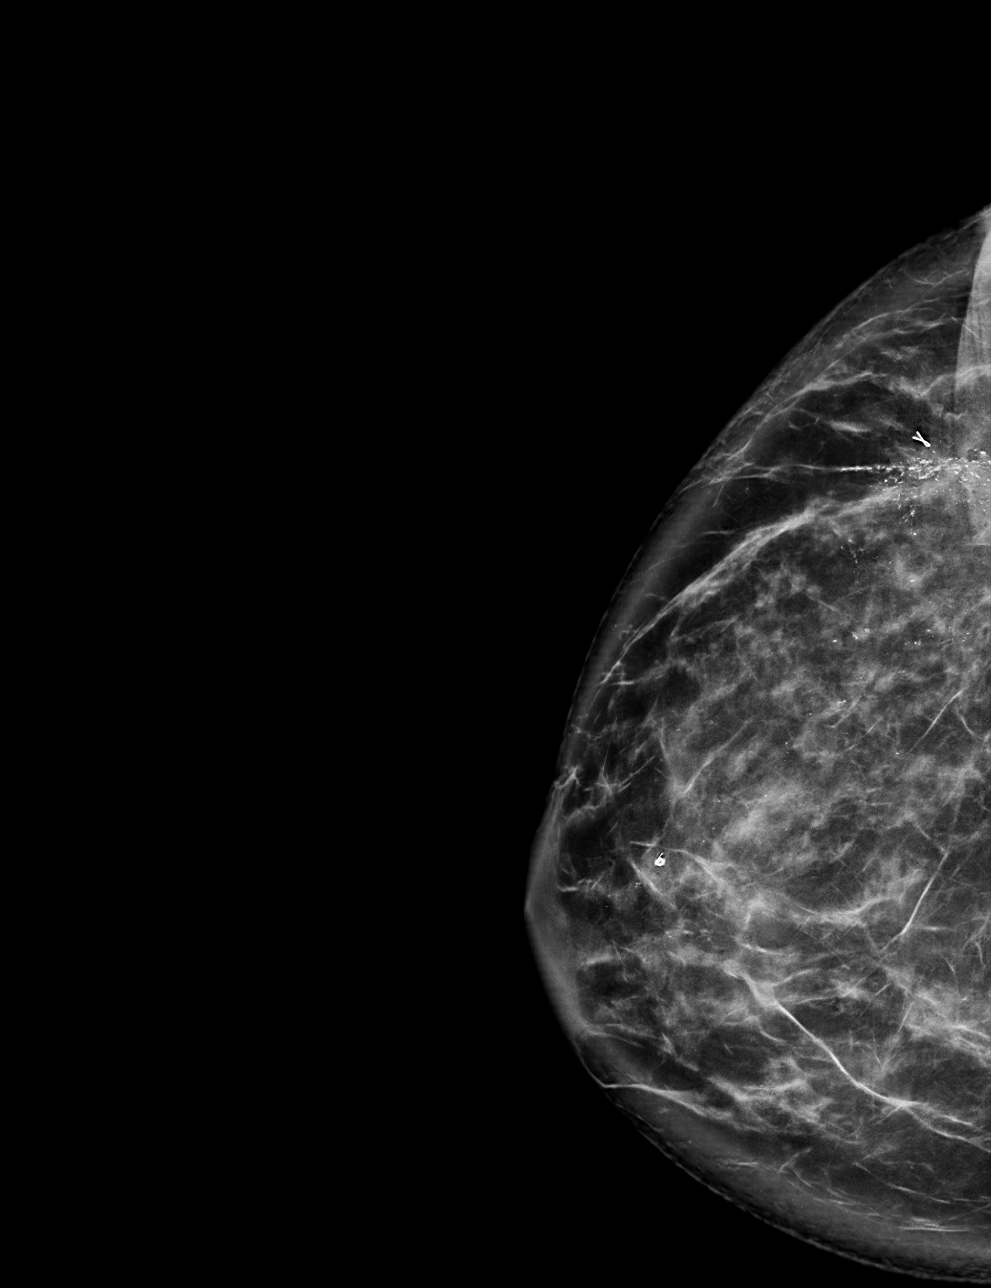

[R ML synth-2D]
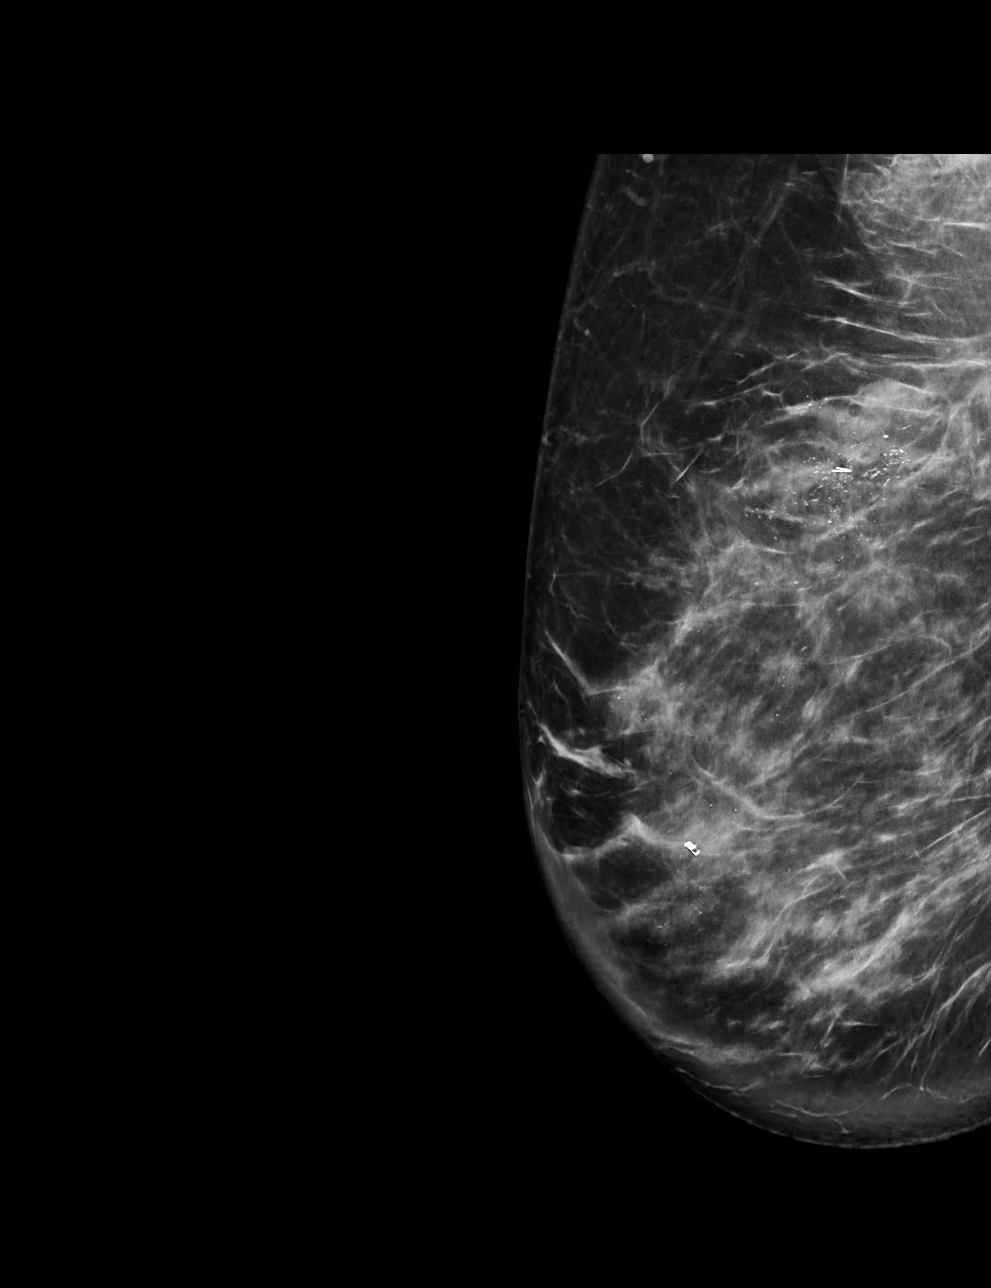

[R CC tomo · tomo slice 45/90.0]
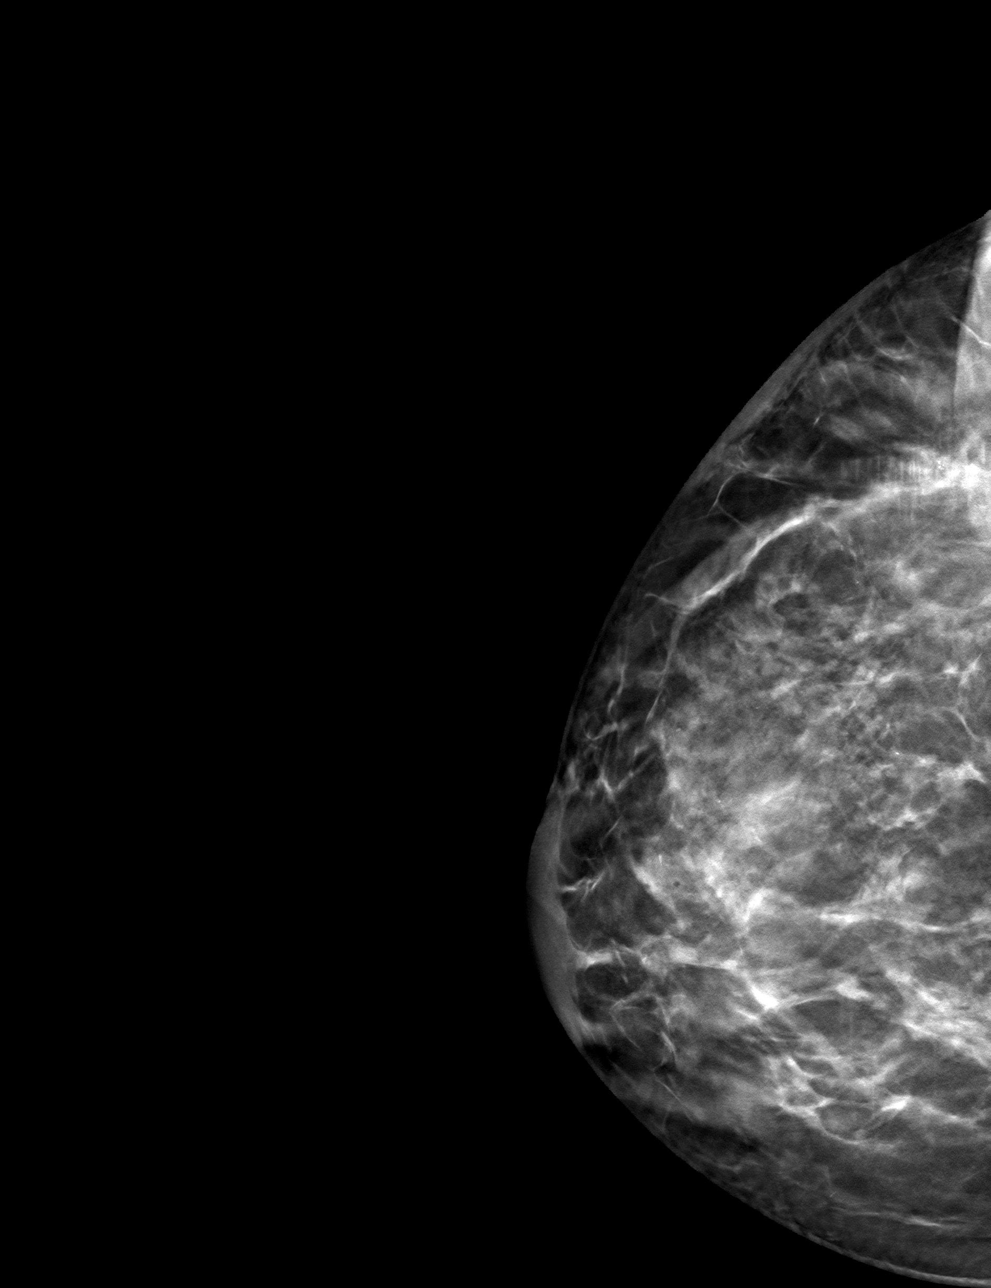

[R ML tomo · tomo slice 45/88.0]
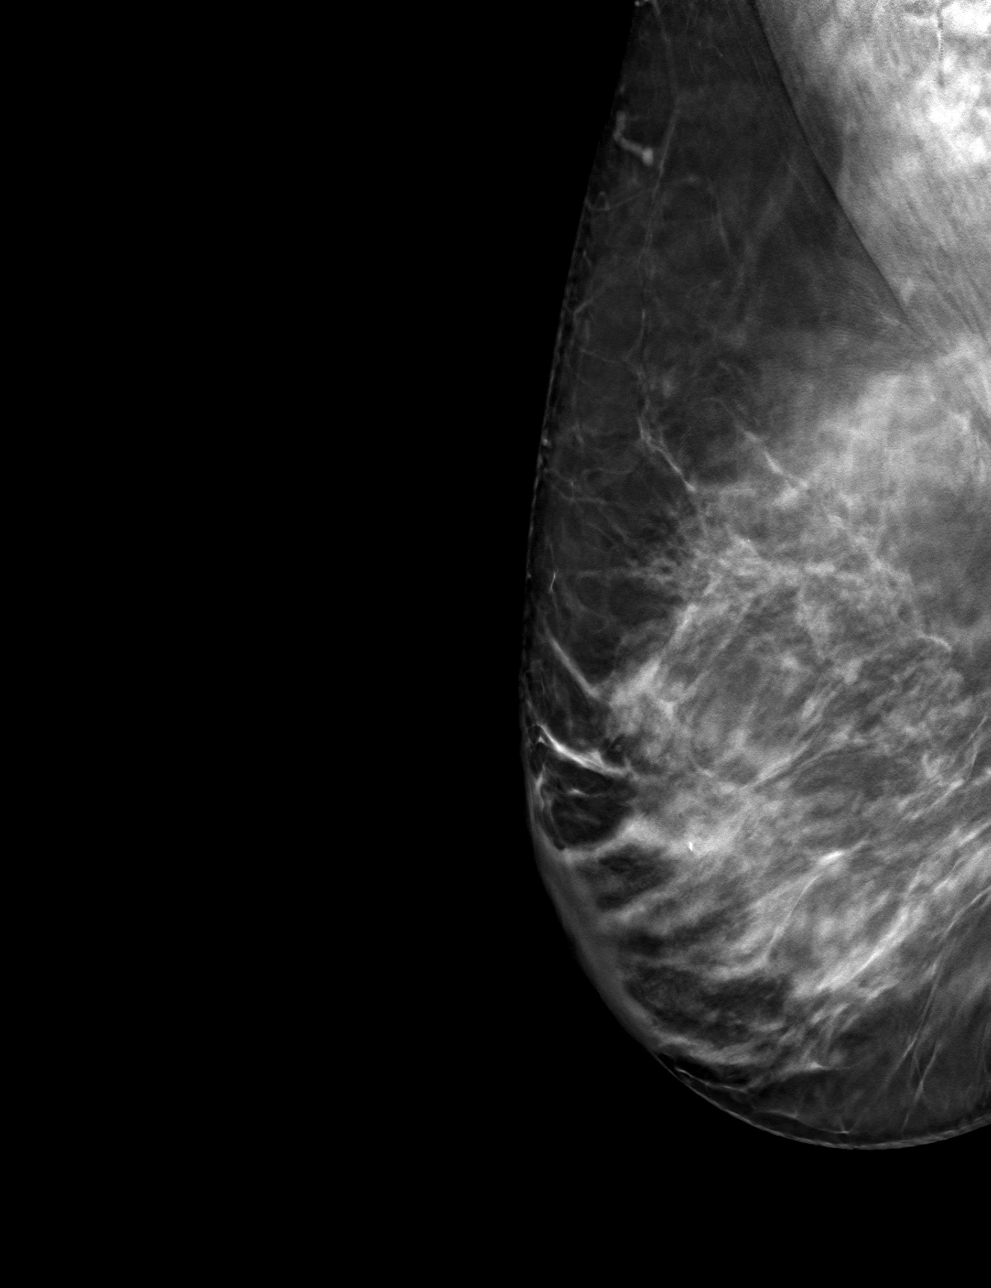

[4 of 12 positions shown; findings below may reference images not displayed]

FINDINGS: Mammographic images were obtained following ultrasound-guided and
stereotactic guided biopsy of the right breast. The ribbon shaped
biopsy clip lies in the upper outer quadrant along with a prominent
group of pleomorphic calcifications consistent with the expected
location of the 9 mm mass. The HydroMARK clip lies in the right
axilla in the expected location of the sampled abnormal axillary
lymph node. The coil shaped biopsy clip lies along the anterior
extent of the calcifications, in the lateral retroareolar right
breast.
IMPRESSION: Appropriate positioning of the ribbon shaped, HydroMARK and coil
shaped biopsy clips. Ribbon shaped biopsy clip marks Lesion #1,
HydroMARK clip marks Lesion #2 and the coil shaped clip marks Lesion
#3.

Final Assessment: Post Procedure Mammograms for Marker Placement

## 2019-05-18 IMAGING — MG MM BREAST BX W/ LOC DEV 1ST LESION IMAGE BX SPEC STEREO GUIDE*R*
4 series · 5 of 16 positions shown · non-contrast
Comparison: Previous exams.
COMPARISON: Previous exams.
COMPARISON: Previous exams.

Addendum:
CLINICAL DATA: Patient presents for stereotactic core needle biopsy
of the anterior extent of a 9 cm span of suspicious right breast
calcifications. Prior to this procedure, the patient underwent
ultrasound-guided core needle biopsy of a 10 o'clock position, 9 mm
mass and an abnormal right axillary lymph node.

EXAM:
RIGHT BREAST STEREOTACTIC CORE NEEDLE BIOPSY

[R CC]
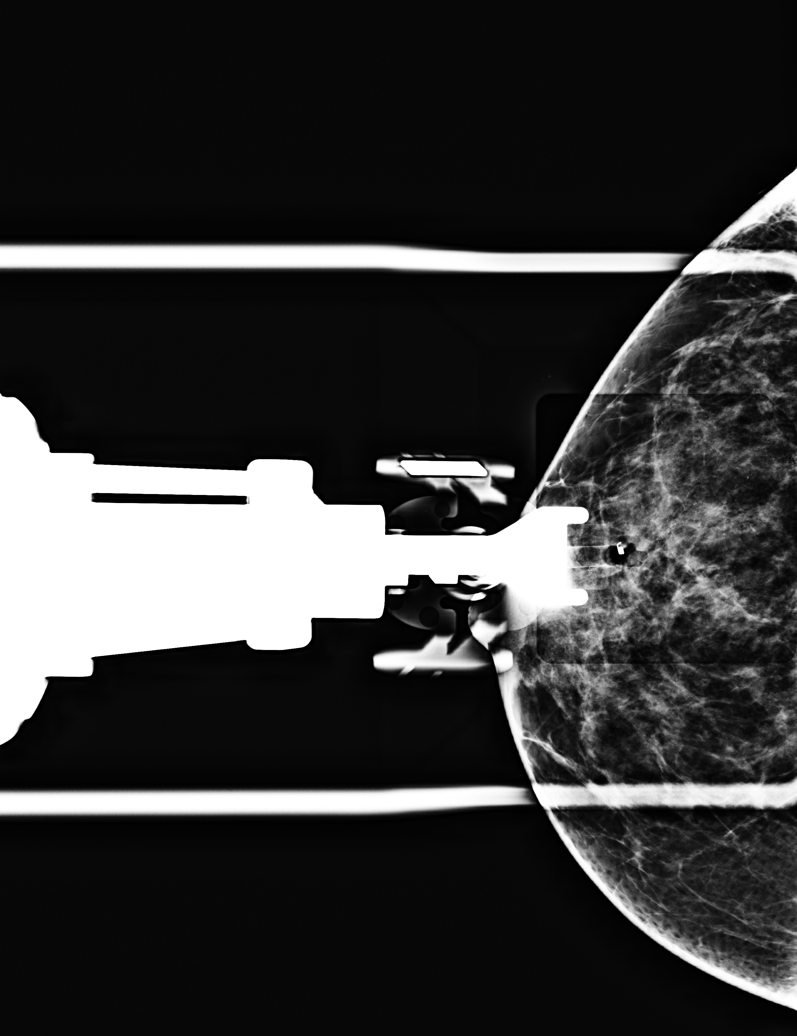

[R CC tomo · 2 of 54 frames shown (1 of 3)]
[frame 18/54]
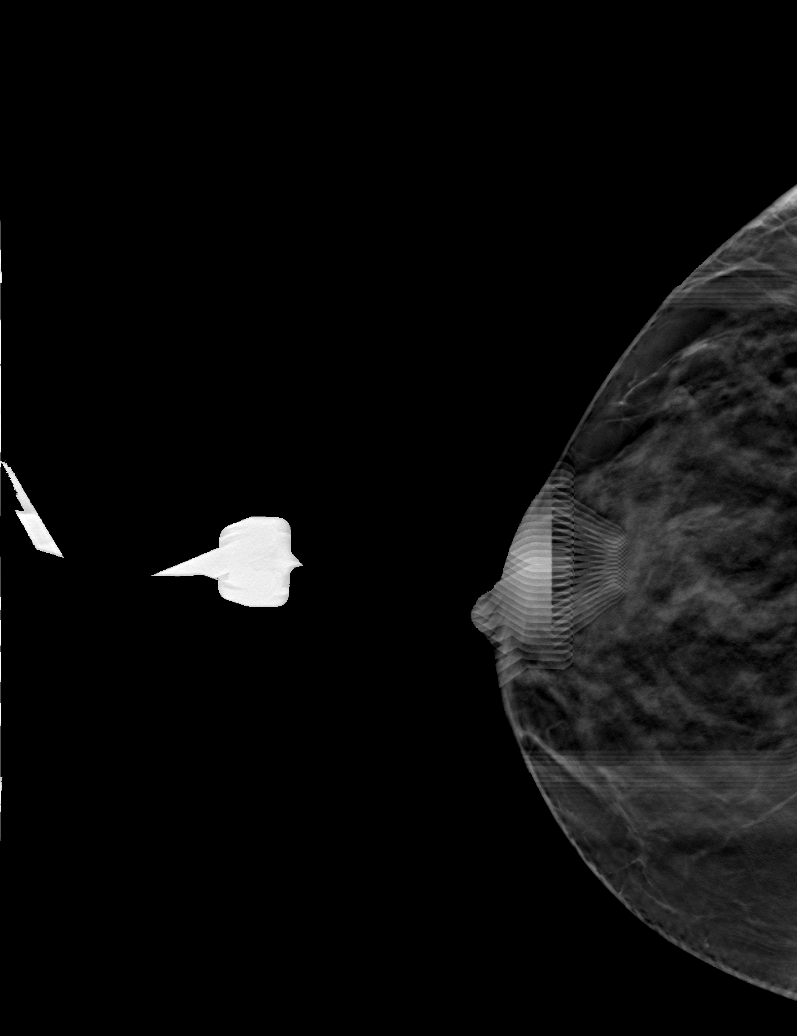
[frame 27/54]
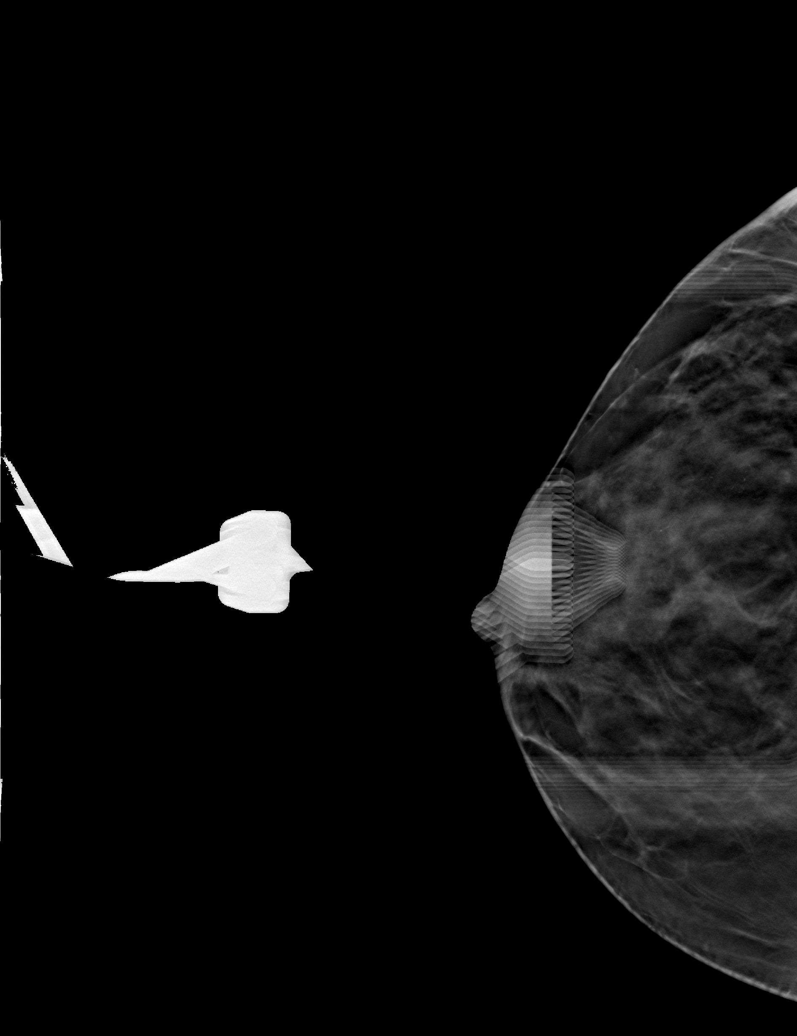

[R CC tomo (2 of 3) · tomo slice 27/54.0]
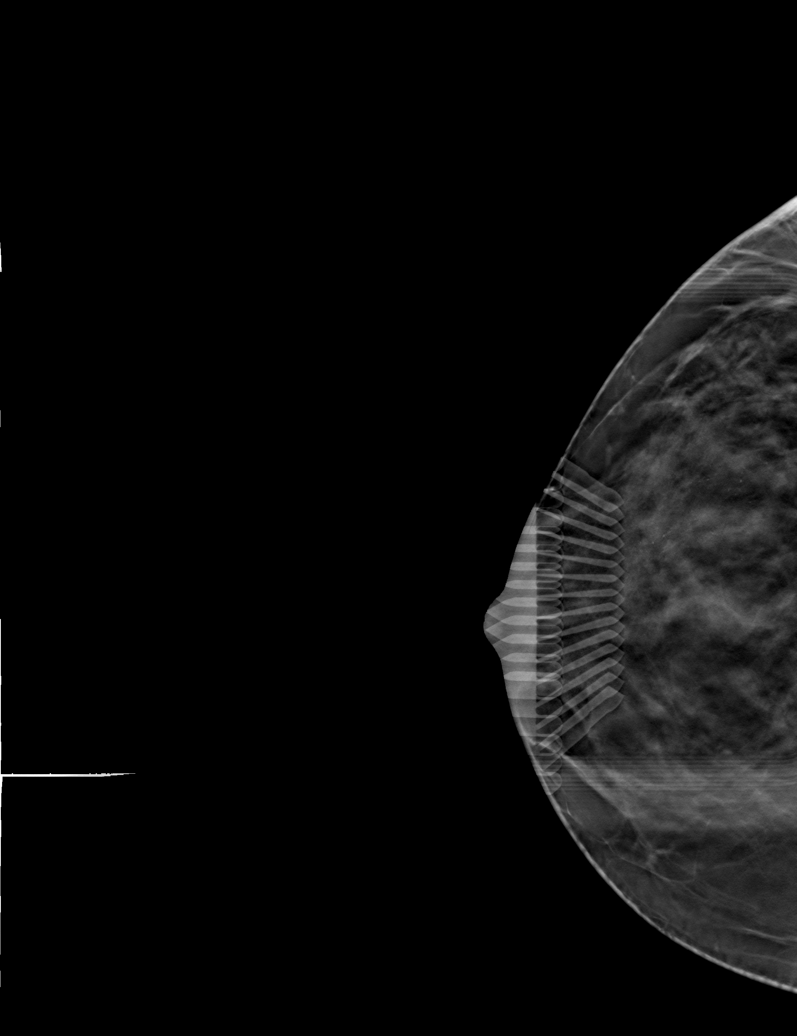

[R CC tomo (3 of 3) · tomo slice 31/61.0]
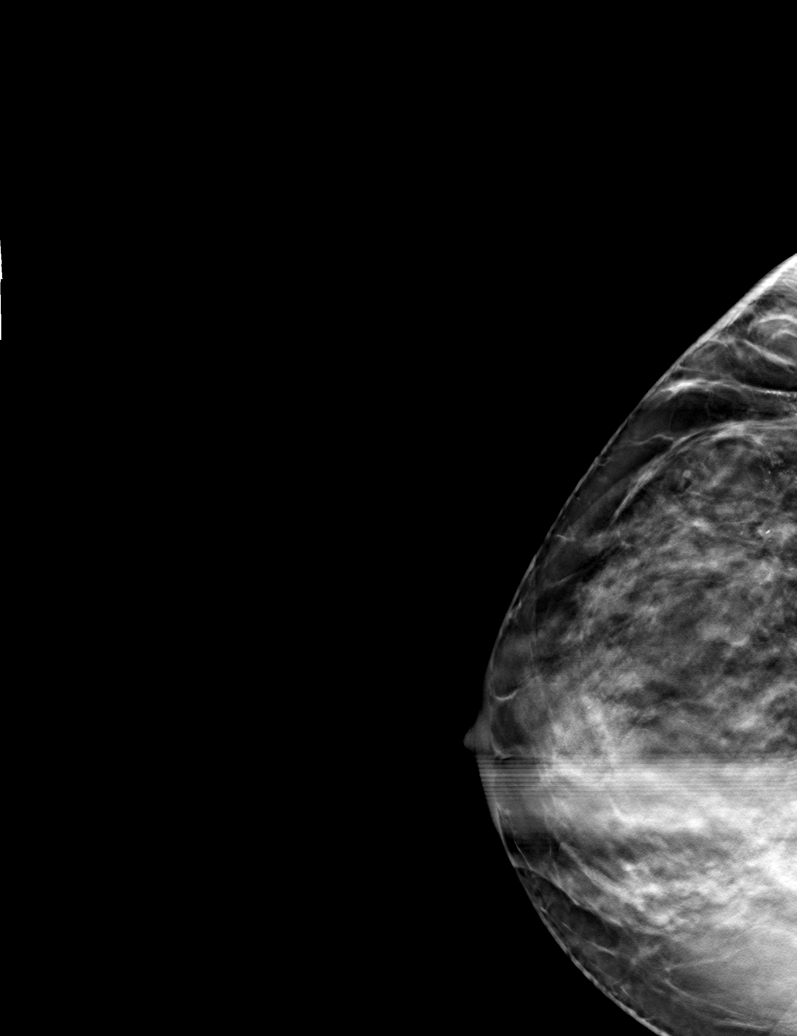

[5 of 16 positions shown; findings below may reference images not displayed]



Using sterile technique and 1% Lidocaine as local anesthetic, under
stereotactic guidance, a 9 gauge vacuum assisted device was used to
perform core needle biopsy of calcifications in the anterior upper
outer quadrant of the right breast using a superior approach.
Specimen radiograph was performed showing multiple calcifications
for which biopsy was performed. Specimens with calcifications are
identified for pathology.

Lesion quadrant: Upper outer quadrant

At the conclusion of the procedure, coil shaped tissue marker clip
was deployed into the biopsy cavity. Follow-up 2-view mammogram was
performed and dictated separately.
IMPRESSION: Stereotactic-guided biopsy of the right breast. No apparent
complications.

ADDENDUM:
Pathology revealed GRADE II INVASIVE DUCTAL CARCINOMA. DUCTAL
CARCINOMA IN SITU of the RIGHT breast, 10 o'clock. This was found to
be concordant by Dr. ZACHARIAH.

Pathology revealed METASTATIC CARCINOMA INVOLVING ONE LYMPH NODE of
the RIGHT axilla. This was found to be concordant by Dr. ZACHARIAH
ZACHARIAH.

Pathology revealed MAMMARY CARCINOMA of the RIGHT breast
calcifications, anterior, upper outer quadrant. The biopsy has
mammary carcinoma in situ with foci concerning for invasion. This
was found to be concordant by Dr. ZACHARIAH.

Pathology results were discussed with the patient by telephone. The
patient reported doing well after the biopsies with tenderness at
the sites. Post biopsy instructions and care were reviewed and
questions were answered. The patient was encouraged to call The
the request of the patient, Dr. [REDACTED] Oncologist was
notified via [REDACTED] in-basket of these results.

Per patient request, surgical consultation has been arranged with
Dr. ZACHARIAH at [REDACTED] on [DATE]. Breast MRI is recommended to determine stage of disease.

Pathology results reported by ZACHARIAH, RN on [DATE].

ADDENDUM:
The patient is scheduled to see Dr. ZACHARIAH of [HOSPITAL]
in [HOSPITAL] on [DATE].

ZACHARIAH, RN on [DATE].

*** End of Addendum ***
Addendum:
FINDINGS: The patient and I discussed the procedure of stereotactic-guided
biopsy including benefits and alternatives. We discussed the high
likelihood of a successful procedure. We discussed the risks of the
procedure including infection, bleeding, tissue injury, clip
migration, and inadequate sampling. Informed written consent was
given. The usual time out protocol was performed immediately prior
to the procedure.

Using sterile technique and 1% Lidocaine as local anesthetic, under
stereotactic guidance, a 9 gauge vacuum assisted device was used to
perform core needle biopsy of calcifications in the anterior upper
outer quadrant of the right breast using a superior approach.
Specimen radiograph was performed showing multiple calcifications
for which biopsy was performed. Specimens with calcifications are
identified for pathology.

Lesion quadrant: Upper outer quadrant

At the conclusion of the procedure, coil shaped tissue marker clip
was deployed into the biopsy cavity. Follow-up 2-view mammogram was
performed and dictated separately.
IMPRESSION: Stereotactic-guided biopsy of the right breast. No apparent
complications.

ADDENDUM:
Pathology revealed GRADE II INVASIVE DUCTAL CARCINOMA. DUCTAL
CARCINOMA IN SITU of the RIGHT breast, 10 o'clock. This was found to
be concordant by Dr. ZACHARIAH.

Pathology revealed METASTATIC CARCINOMA INVOLVING ONE LYMPH NODE of
the RIGHT axilla. This was found to be concordant by Dr. ZACHARIAH
ZACHARIAH.

Pathology revealed MAMMARY CARCINOMA of the RIGHT breast
calcifications, anterior, upper outer quadrant. The biopsy has
mammary carcinoma in situ with foci concerning for invasion. This
was found to be concordant by Dr. ZACHARIAH.

Pathology results were discussed with the patient by telephone. The
patient reported doing well after the biopsies with tenderness at
the sites. Post biopsy instructions and care were reviewed and
questions were answered. The patient was encouraged to call The
the request of the patient, Dr. [REDACTED] Oncologist was
notified via [REDACTED] in-basket of these results.

Per patient request, surgical consultation has been arranged with
Dr. ZACHARIAH at [REDACTED] on [DATE]. Breast MRI is recommended to determine stage of disease.

Pathology results reported by ZACHARIAH, RN on [DATE].



Using sterile technique and 1% Lidocaine as local anesthetic, under
stereotactic guidance, a 9 gauge vacuum assisted device was used to
perform core needle biopsy of calcifications in the anterior upper
outer quadrant of the right breast using a superior approach.
Specimen radiograph was performed showing multiple calcifications
for which biopsy was performed. Specimens with calcifications are
identified for pathology.

Lesion quadrant: Upper outer quadrant

At the conclusion of the procedure, coil shaped tissue marker clip
was deployed into the biopsy cavity. Follow-up 2-view mammogram was
performed and dictated separately.
IMPRESSION: Stereotactic-guided biopsy of the right breast. No apparent
complications.

## 2019-05-18 IMAGING — MG MM BREAST BX W/ LOC DEV 1ST LESION IMAGE BX SPEC STEREO GUIDE*R*
7 series · 7 of 7 positions shown · non-contrast
Comparison: Previous exams.
COMPARISON: Previous exams.
COMPARISON: Previous exams.

Addendum:
CLINICAL DATA: Patient presents for stereotactic core needle biopsy
of the anterior extent of a 9 cm span of suspicious right breast
calcifications. Prior to this procedure, the patient underwent
ultrasound-guided core needle biopsy of a 10 o'clock position, 9 mm
mass and an abnormal right axillary lymph node.

EXAM:
RIGHT BREAST STEREOTACTIC CORE NEEDLE BIOPSY

[R (1 of 7)]
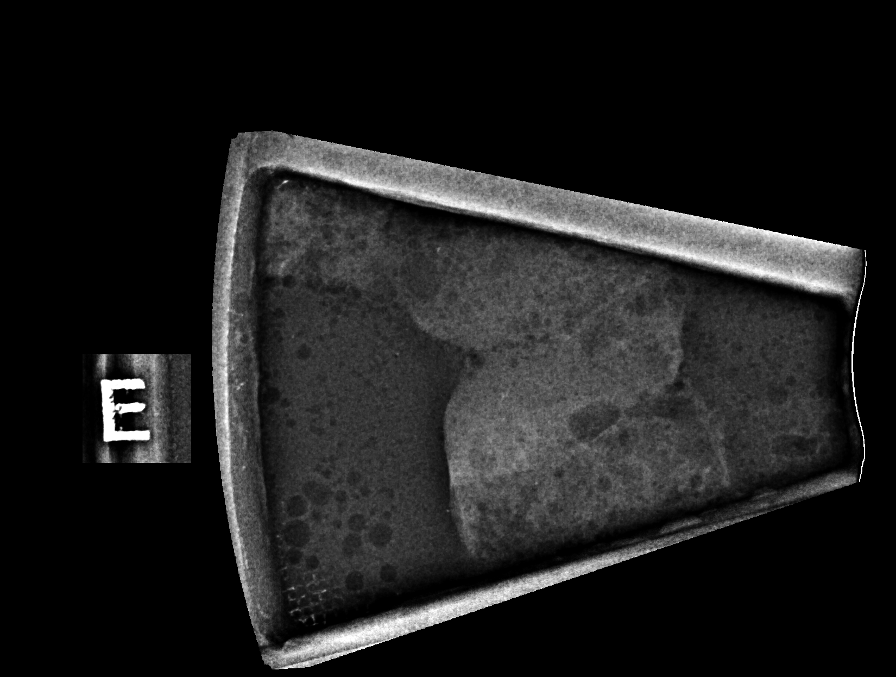

[R (2 of 7)]
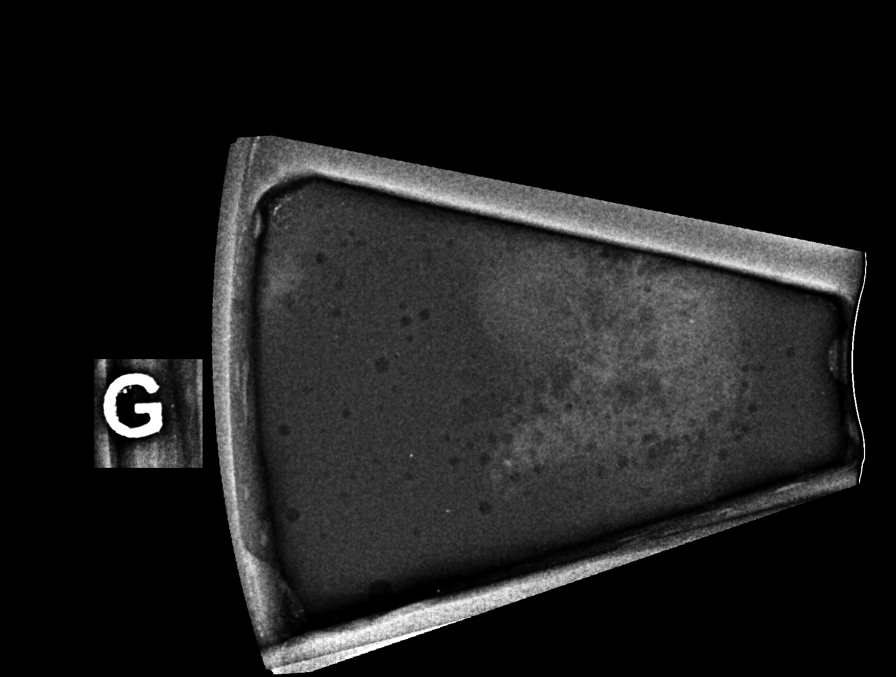

[R (3 of 7)]
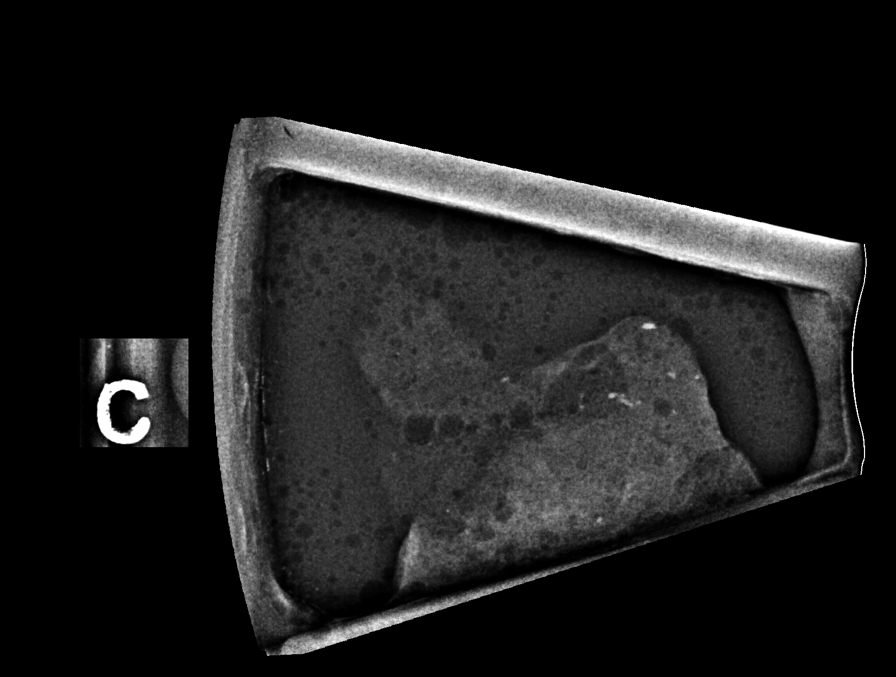

[R (4 of 7)]
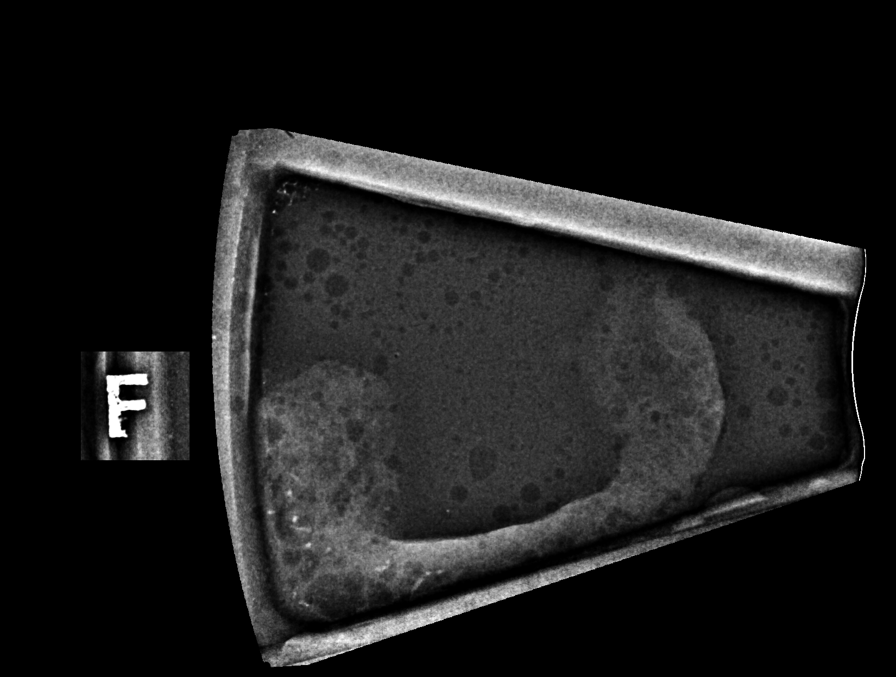

[R (5 of 7)]
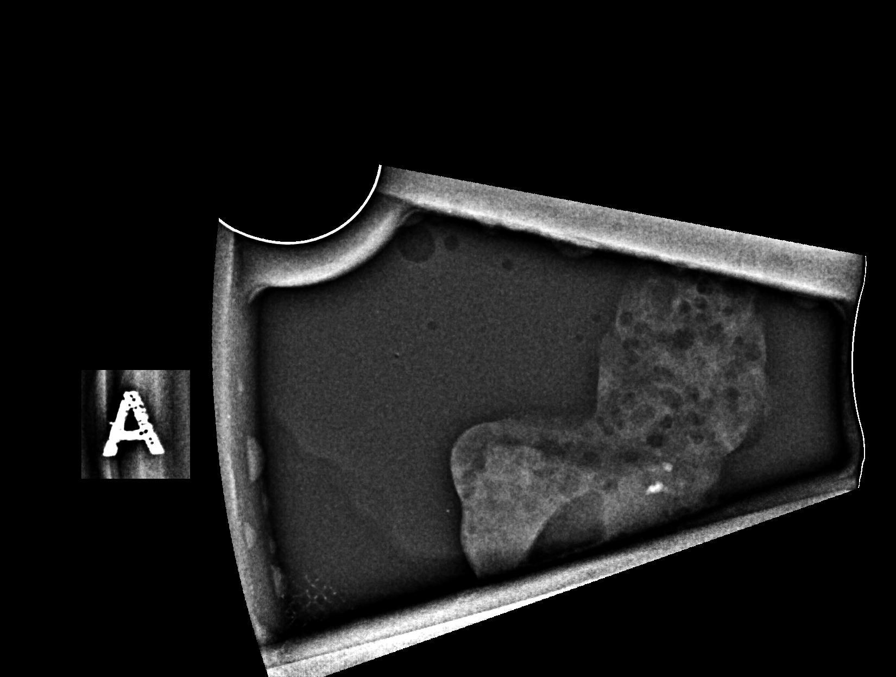

[R (6 of 7)]
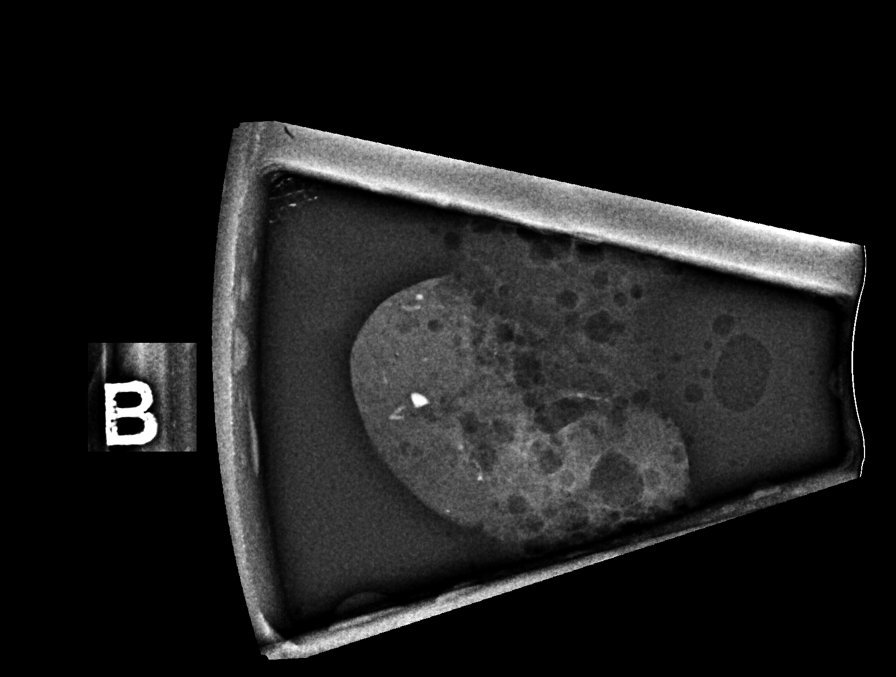

[R (7 of 7)]
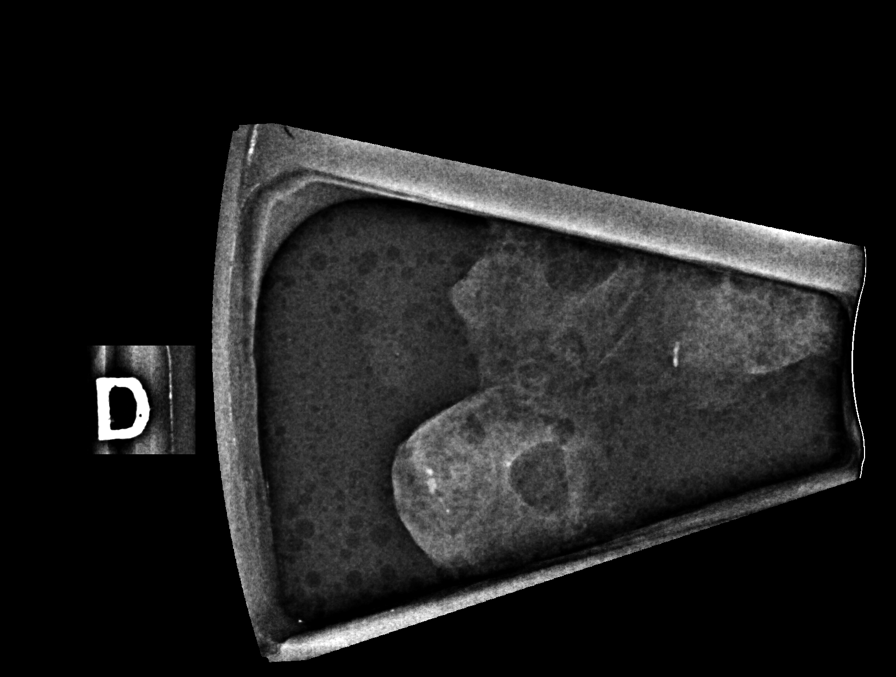

[7 of 7 positions shown; findings below may reference images not displayed]



Using sterile technique and 1% Lidocaine as local anesthetic, under
stereotactic guidance, a 9 gauge vacuum assisted device was used to
perform core needle biopsy of calcifications in the anterior upper
outer quadrant of the right breast using a superior approach.
Specimen radiograph was performed showing multiple calcifications
for which biopsy was performed. Specimens with calcifications are
identified for pathology.

Lesion quadrant: Upper outer quadrant

At the conclusion of the procedure, coil shaped tissue marker clip
was deployed into the biopsy cavity. Follow-up 2-view mammogram was
performed and dictated separately.
IMPRESSION: Stereotactic-guided biopsy of the right breast. No apparent
complications.

ADDENDUM:
Pathology revealed GRADE II INVASIVE DUCTAL CARCINOMA. DUCTAL
CARCINOMA IN SITU of the RIGHT breast, 10 o'clock. This was found to
be concordant by Dr. ZACHARIAH.

Pathology revealed METASTATIC CARCINOMA INVOLVING ONE LYMPH NODE of
the RIGHT axilla. This was found to be concordant by Dr. ZACHARIAH
ZACHARIAH.

Pathology revealed MAMMARY CARCINOMA of the RIGHT breast
calcifications, anterior, upper outer quadrant. The biopsy has
mammary carcinoma in situ with foci concerning for invasion. This
was found to be concordant by Dr. ZACHARIAH.

Pathology results were discussed with the patient by telephone. The
patient reported doing well after the biopsies with tenderness at
the sites. Post biopsy instructions and care were reviewed and
questions were answered. The patient was encouraged to call The
the request of the patient, Dr. [REDACTED] Oncologist was
notified via [REDACTED] in-basket of these results.

Per patient request, surgical consultation has been arranged with
Dr. ZACHARIAH at [REDACTED] on [DATE]. Breast MRI is recommended to determine stage of disease.

Pathology results reported by ZACHARIAH, RN on [DATE].

ADDENDUM:
The patient is scheduled to see Dr. ZACHARIAH of [HOSPITAL]
in [HOSPITAL] on [DATE].

ZACHARIAH, RN on [DATE].

*** End of Addendum ***
Addendum:
FINDINGS: The patient and I discussed the procedure of stereotactic-guided
biopsy including benefits and alternatives. We discussed the high
likelihood of a successful procedure. We discussed the risks of the
procedure including infection, bleeding, tissue injury, clip
migration, and inadequate sampling. Informed written consent was
given. The usual time out protocol was performed immediately prior
to the procedure.

Using sterile technique and 1% Lidocaine as local anesthetic, under
stereotactic guidance, a 9 gauge vacuum assisted device was used to
perform core needle biopsy of calcifications in the anterior upper
outer quadrant of the right breast using a superior approach.
Specimen radiograph was performed showing multiple calcifications
for which biopsy was performed. Specimens with calcifications are
identified for pathology.

Lesion quadrant: Upper outer quadrant

At the conclusion of the procedure, coil shaped tissue marker clip
was deployed into the biopsy cavity. Follow-up 2-view mammogram was
performed and dictated separately.
IMPRESSION: Stereotactic-guided biopsy of the right breast. No apparent
complications.

ADDENDUM:
Pathology revealed GRADE II INVASIVE DUCTAL CARCINOMA. DUCTAL
CARCINOMA IN SITU of the RIGHT breast, 10 o'clock. This was found to
be concordant by Dr. ZACHARIAH.

Pathology revealed METASTATIC CARCINOMA INVOLVING ONE LYMPH NODE of
the RIGHT axilla. This was found to be concordant by Dr. ZACHARIAH
ZACHARIAH.

Pathology revealed MAMMARY CARCINOMA of the RIGHT breast
calcifications, anterior, upper outer quadrant. The biopsy has
mammary carcinoma in situ with foci concerning for invasion. This
was found to be concordant by Dr. ZACHARIAH.

Pathology results were discussed with the patient by telephone. The
patient reported doing well after the biopsies with tenderness at
the sites. Post biopsy instructions and care were reviewed and
questions were answered. The patient was encouraged to call The
the request of the patient, Dr. [REDACTED] Oncologist was
notified via [REDACTED] in-basket of these results.

Per patient request, surgical consultation has been arranged with
Dr. ZACHARIAH at [REDACTED] on [DATE]. Breast MRI is recommended to determine stage of disease.

Pathology results reported by ZACHARIAH, RN on [DATE].



Using sterile technique and 1% Lidocaine as local anesthetic, under
stereotactic guidance, a 9 gauge vacuum assisted device was used to
perform core needle biopsy of calcifications in the anterior upper
outer quadrant of the right breast using a superior approach.
Specimen radiograph was performed showing multiple calcifications
for which biopsy was performed. Specimens with calcifications are
identified for pathology.

Lesion quadrant: Upper outer quadrant

At the conclusion of the procedure, coil shaped tissue marker clip
was deployed into the biopsy cavity. Follow-up 2-view mammogram was
performed and dictated separately.
IMPRESSION: Stereotactic-guided biopsy of the right breast. No apparent
complications.

## 2019-05-22 NOTE — Progress Notes (Signed)
These results were noted and were faxed to Dr. Hosie Poisson. Sandi Mealy

## 2019-05-28 DIAGNOSIS — D72829 Elevated white blood cell count, unspecified: Secondary | ICD-10-CM | POA: Diagnosis not present

## 2019-06-07 DIAGNOSIS — D72829 Elevated white blood cell count, unspecified: Secondary | ICD-10-CM | POA: Diagnosis not present

## 2019-06-12 DIAGNOSIS — D72829 Elevated white blood cell count, unspecified: Secondary | ICD-10-CM | POA: Diagnosis not present

## 2019-06-21 DIAGNOSIS — D72829 Elevated white blood cell count, unspecified: Secondary | ICD-10-CM | POA: Diagnosis not present

## 2019-06-21 DIAGNOSIS — D696 Thrombocytopenia, unspecified: Secondary | ICD-10-CM | POA: Diagnosis not present

## 2019-06-21 DIAGNOSIS — D649 Anemia, unspecified: Secondary | ICD-10-CM | POA: Diagnosis not present

## 2019-06-25 DIAGNOSIS — D696 Thrombocytopenia, unspecified: Secondary | ICD-10-CM | POA: Diagnosis not present

## 2019-06-25 DIAGNOSIS — D649 Anemia, unspecified: Secondary | ICD-10-CM | POA: Diagnosis not present

## 2019-06-25 DIAGNOSIS — D72829 Elevated white blood cell count, unspecified: Secondary | ICD-10-CM | POA: Diagnosis not present

## 2019-07-26 DIAGNOSIS — D649 Anemia, unspecified: Secondary | ICD-10-CM | POA: Diagnosis not present

## 2019-07-26 DIAGNOSIS — D696 Thrombocytopenia, unspecified: Secondary | ICD-10-CM | POA: Diagnosis not present

## 2019-07-26 DIAGNOSIS — D72829 Elevated white blood cell count, unspecified: Secondary | ICD-10-CM | POA: Diagnosis not present

## 2019-08-29 DIAGNOSIS — D72829 Elevated white blood cell count, unspecified: Secondary | ICD-10-CM | POA: Diagnosis not present

## 2019-10-25 DIAGNOSIS — D649 Anemia, unspecified: Secondary | ICD-10-CM | POA: Diagnosis not present

## 2019-10-25 DIAGNOSIS — D72829 Elevated white blood cell count, unspecified: Secondary | ICD-10-CM | POA: Diagnosis not present

## 2019-11-07 ENCOUNTER — Other Ambulatory Visit: Payer: Self-pay | Admitting: Obstetrics and Gynecology

## 2019-11-15 ENCOUNTER — Other Ambulatory Visit: Payer: Self-pay

## 2019-11-15 ENCOUNTER — Encounter (HOSPITAL_BASED_OUTPATIENT_CLINIC_OR_DEPARTMENT_OTHER): Payer: Self-pay | Admitting: Obstetrics and Gynecology

## 2019-11-15 NOTE — Progress Notes (Addendum)
ADDENDUM:  Requested and received via fax pt's lov note with oncologist, Dr Hinton Rao, and lab results both dated 11-08-2019 (placed in chart).  Unless Dr Garwin Brothers still wants another cbc done, these lab can be used since they in date per anesthesia guidelines.   Spoke w/ via phone for pre-op interview--- PT Lab needs dos----  CBC            Lab results------ per pt had lab work done last week at [facility].  Called and requested results COVID test ------ 11-19-2019 @ 0900 Arrive at ------- 1345 NPO after ------ MN w/ exception clear liquids until 0930 then nothing by mouth (no cream/milk products) Medications to take morning of surgery ----- synthroid w/ sips of water Diabetic medication ----- n/a Patient Special Instructions ----- n/a Pre-Op special Istructions ----- called and requested to have lab results and lov from [facility] Patient verbalized understanding of instructions that were given at this phone interview. Patient denies shortness of breath, chest pain, fever, cough a this phone interview.

## 2019-11-19 ENCOUNTER — Other Ambulatory Visit (HOSPITAL_COMMUNITY)
Admission: RE | Admit: 2019-11-19 | Discharge: 2019-11-19 | Disposition: A | Discharge status: Home / Self Care | Payer: Commercial Managed Care - PPO | Source: Ambulatory Visit | Attending: Obstetrics and Gynecology | Admitting: Obstetrics and Gynecology

## 2019-11-19 DIAGNOSIS — Z01812 Encounter for preprocedural laboratory examination: Secondary | ICD-10-CM | POA: Diagnosis present

## 2019-11-19 DIAGNOSIS — Z20822 Contact with and (suspected) exposure to covid-19: Secondary | ICD-10-CM | POA: Diagnosis not present

## 2019-11-19 LAB — SARS CORONAVIRUS 2 (TAT 6-24 HRS): SARS Coronavirus 2: NEGATIVE

## 2019-11-22 ENCOUNTER — Encounter (HOSPITAL_BASED_OUTPATIENT_CLINIC_OR_DEPARTMENT_OTHER): Payer: Self-pay | Admitting: Obstetrics and Gynecology

## 2019-11-22 ENCOUNTER — Ambulatory Visit (HOSPITAL_BASED_OUTPATIENT_CLINIC_OR_DEPARTMENT_OTHER): Payer: Commercial Managed Care - PPO | Admitting: Physician Assistant

## 2019-11-22 ENCOUNTER — Encounter (HOSPITAL_BASED_OUTPATIENT_CLINIC_OR_DEPARTMENT_OTHER)
Admission: RE | Disposition: A | Discharge status: Home / Self Care | Payer: Self-pay | Source: Home / Self Care | Attending: Obstetrics and Gynecology

## 2019-11-22 ENCOUNTER — Ambulatory Visit (HOSPITAL_BASED_OUTPATIENT_CLINIC_OR_DEPARTMENT_OTHER)
Admission: RE | Admit: 2019-11-22 | Discharge: 2019-11-22 | Disposition: A | Discharge status: Home / Self Care | Payer: Commercial Managed Care - PPO | Attending: Obstetrics and Gynecology | Admitting: Obstetrics and Gynecology

## 2019-11-22 DIAGNOSIS — R7303 Prediabetes: Secondary | ICD-10-CM | POA: Insufficient documentation

## 2019-11-22 DIAGNOSIS — Z4589 Encounter for adjustment and management of other implanted devices: Secondary | ICD-10-CM | POA: Diagnosis not present

## 2019-11-22 DIAGNOSIS — E890 Postprocedural hypothyroidism: Secondary | ICD-10-CM | POA: Insufficient documentation

## 2019-11-22 DIAGNOSIS — C50919 Malignant neoplasm of unspecified site of unspecified female breast: Secondary | ICD-10-CM | POA: Insufficient documentation

## 2019-11-22 DIAGNOSIS — Z793 Long term (current) use of hormonal contraceptives: Secondary | ICD-10-CM | POA: Insufficient documentation

## 2019-11-22 DIAGNOSIS — D649 Anemia, unspecified: Secondary | ICD-10-CM | POA: Insufficient documentation

## 2019-11-22 DIAGNOSIS — C7951 Secondary malignant neoplasm of bone: Secondary | ICD-10-CM | POA: Diagnosis not present

## 2019-11-22 DIAGNOSIS — Z79899 Other long term (current) drug therapy: Secondary | ICD-10-CM | POA: Diagnosis not present

## 2019-11-22 DIAGNOSIS — Z807 Family history of other malignant neoplasms of lymphoid, hematopoietic and related tissues: Secondary | ICD-10-CM | POA: Insufficient documentation

## 2019-11-22 DIAGNOSIS — Z833 Family history of diabetes mellitus: Secondary | ICD-10-CM | POA: Insufficient documentation

## 2019-11-22 DIAGNOSIS — Z302 Encounter for sterilization: Secondary | ICD-10-CM | POA: Insufficient documentation

## 2019-11-22 HISTORY — DX: Personal history of other endocrine, nutritional and metabolic disease: Z86.39

## 2019-11-22 HISTORY — PX: IUD REMOVAL: SHX5392

## 2019-11-22 HISTORY — DX: Prediabetes: R73.03

## 2019-11-22 HISTORY — PX: LAPAROSCOPIC TUBAL LIGATION: SHX1937

## 2019-11-22 HISTORY — DX: Personal history of other specified conditions: Z87.898

## 2019-11-22 LAB — POCT PREGNANCY, URINE: Preg Test, Ur: NEGATIVE

## 2019-11-22 LAB — CBC
HCT: 38.1 % (ref 36.0–46.0)
Hemoglobin: 12.1 g/dL (ref 12.0–15.0)
MCH: 31.2 pg (ref 26.0–34.0)
MCHC: 31.8 g/dL (ref 30.0–36.0)
MCV: 98.2 fL (ref 80.0–100.0)
Platelets: 321 10*3/uL (ref 150–400)
RBC: 3.88 MIL/uL (ref 3.87–5.11)
RDW: 20.9 % — ABNORMAL HIGH (ref 11.5–15.5)
WBC: 3.8 10*3/uL — ABNORMAL LOW (ref 4.0–10.5)
nRBC: 0 % (ref 0.0–0.2)

## 2019-11-22 SURGERY — LIGATION, FALLOPIAN TUBE, LAPAROSCOPIC
Anesthesia: General | Site: Abdomen

## 2019-11-22 MED ORDER — LACTATED RINGERS IV SOLN: INTRAVENOUS | Status: DC

## 2019-11-22 MED ORDER — FENTANYL CITRATE (PF) 100 MCG/2ML IJ SOLN
INTRAMUSCULAR | Status: AC
  Filled 2019-11-22: qty 2

## 2019-11-22 MED ORDER — BUPIVACAINE HCL (PF) 0.25 % IJ SOLN
INTRAMUSCULAR | Status: AC
  Filled 2019-11-22: qty 30

## 2019-11-22 MED ORDER — ACETAMINOPHEN 500 MG PO TABS
1000.0000 mg | ORAL_TABLET | Freq: Once | ORAL | Status: AC
  Administered 2019-11-22: 1000 mg via ORAL

## 2019-11-22 MED ORDER — LIDOCAINE 2% (20 MG/ML) 5 ML SYRINGE
INTRAMUSCULAR | Status: AC
  Filled 2019-11-22: qty 5

## 2019-11-22 MED ORDER — KETOROLAC TROMETHAMINE 30 MG/ML IJ SOLN
INTRAMUSCULAR | Status: DC | PRN
Start: 2019-11-22 — End: 2019-11-22
  Administered 2019-11-22: 30 mg via INTRAVENOUS

## 2019-11-22 MED ORDER — SCOPOLAMINE 1 MG/3DAYS TD PT72
MEDICATED_PATCH | TRANSDERMAL | Status: DC | PRN
  Administered 2019-11-22: 1 via TRANSDERMAL

## 2019-11-22 MED ORDER — PROMETHAZINE HCL 25 MG/ML IJ SOLN: 6.2500 mg | INTRAMUSCULAR | Status: DC | PRN

## 2019-11-22 MED ORDER — ACETAMINOPHEN 500 MG PO TABS
ORAL_TABLET | ORAL | Status: AC
  Filled 2019-11-22: qty 2

## 2019-11-22 MED ORDER — SODIUM CHLORIDE (PF) 0.9 % IJ SOLN
INTRAMUSCULAR | Status: AC
  Filled 2019-11-22: qty 50

## 2019-11-22 MED ORDER — DEXAMETHASONE SODIUM PHOSPHATE 10 MG/ML IJ SOLN
INTRAMUSCULAR | Status: DC | PRN
  Administered 2019-11-22: 5 mg via INTRAVENOUS

## 2019-11-22 MED ORDER — FENTANYL CITRATE (PF) 100 MCG/2ML IJ SOLN
25.0000 ug | INTRAMUSCULAR | Status: DC | PRN
  Administered 2019-11-22: 25 ug via INTRAVENOUS

## 2019-11-22 MED ORDER — LIDOCAINE HCL (CARDIAC) PF 100 MG/5ML IV SOSY
PREFILLED_SYRINGE | INTRAVENOUS | Status: DC | PRN
  Administered 2019-11-22: 60 mg via INTRAVENOUS

## 2019-11-22 MED ORDER — FENTANYL CITRATE (PF) 100 MCG/2ML IJ SOLN
INTRAMUSCULAR | Status: DC | PRN
  Administered 2019-11-22 (×2): 25 ug via INTRAVENOUS
  Administered 2019-11-22: 100 ug via INTRAVENOUS

## 2019-11-22 MED ORDER — BUPIVACAINE HCL (PF) 0.25 % IJ SOLN
INTRAMUSCULAR | Status: DC | PRN
  Administered 2019-11-22: 6 mL

## 2019-11-22 MED ORDER — SCOPOLAMINE 1 MG/3DAYS TD PT72
MEDICATED_PATCH | TRANSDERMAL | Status: AC
  Filled 2019-11-22: qty 1

## 2019-11-22 MED ORDER — SUGAMMADEX SODIUM 200 MG/2ML IV SOLN
INTRAVENOUS | Status: DC | PRN
  Administered 2019-11-22: 200 mg via INTRAVENOUS

## 2019-11-22 MED ORDER — HYDROCODONE-ACETAMINOPHEN 5-325 MG PO TABS: 1.0000 | ORAL_TABLET | Freq: Four times a day (QID) | ORAL | 0 refills | Status: AC | PRN

## 2019-11-22 MED ORDER — MIDAZOLAM HCL 2 MG/2ML IJ SOLN
INTRAMUSCULAR | Status: DC | PRN
  Administered 2019-11-22: 2 mg via INTRAVENOUS

## 2019-11-22 MED ORDER — PROPOFOL 10 MG/ML IV BOLUS
INTRAVENOUS | Status: DC | PRN
  Administered 2019-11-22: 200 mg via INTRAVENOUS

## 2019-11-22 MED ORDER — DEXAMETHASONE SODIUM PHOSPHATE 10 MG/ML IJ SOLN
INTRAMUSCULAR | Status: AC
  Filled 2019-11-22: qty 1

## 2019-11-22 MED ORDER — ONDANSETRON HCL 4 MG/2ML IJ SOLN
INTRAMUSCULAR | Status: DC | PRN
  Administered 2019-11-22: 4 mg via INTRAVENOUS

## 2019-11-22 MED ORDER — KETOROLAC TROMETHAMINE 30 MG/ML IJ SOLN
INTRAMUSCULAR | Status: AC
  Filled 2019-11-22: qty 1

## 2019-11-22 MED ORDER — ROCURONIUM BROMIDE 10 MG/ML (PF) SYRINGE
PREFILLED_SYRINGE | INTRAVENOUS | Status: AC
  Filled 2019-11-22: qty 10

## 2019-11-22 MED ORDER — ONDANSETRON HCL 4 MG/2ML IJ SOLN
INTRAMUSCULAR | Status: AC
  Filled 2019-11-22: qty 2

## 2019-11-22 MED ORDER — ROCURONIUM BROMIDE 10 MG/ML (PF) SYRINGE
PREFILLED_SYRINGE | INTRAVENOUS | Status: DC | PRN
  Administered 2019-11-22: 40 mg via INTRAVENOUS

## 2019-11-22 MED ORDER — IBUPROFEN 800 MG PO TABS: 800.0000 mg | ORAL_TABLET | Freq: Three times a day (TID) | ORAL | 5 refills | Status: DC | PRN

## 2019-11-22 MED ORDER — MIDAZOLAM HCL 2 MG/2ML IJ SOLN
INTRAMUSCULAR | Status: AC
  Filled 2019-11-22: qty 2

## 2019-11-22 MED ORDER — 0.9 % SODIUM CHLORIDE (POUR BTL) OPTIME
TOPICAL | Status: DC | PRN
  Administered 2019-11-22: 1000 mL

## 2019-11-22 SURGICAL SUPPLY — 23 items
CATH ROBINSON RED A/P 16FR (CATHETERS) ×3 IMPLANT
DERMABOND ADVANCED (GAUZE/BANDAGES/DRESSINGS) ×1
DERMABOND ADVANCED .7 DNX12 (GAUZE/BANDAGES/DRESSINGS) ×2 IMPLANT
DRSG OPSITE POSTOP 3X4 (GAUZE/BANDAGES/DRESSINGS) IMPLANT
DURAPREP 26ML APPLICATOR (WOUND CARE) ×3 IMPLANT
GLOVE BIOGEL PI IND STRL 7.0 (GLOVE) ×4 IMPLANT
GLOVE BIOGEL PI INDICATOR 7.0 (GLOVE) ×2
GLOVE ECLIPSE 6.5 STRL STRAW (GLOVE) ×3 IMPLANT
GOWN STRL REUS W/ TWL LRG LVL3 (GOWN DISPOSABLE) ×4 IMPLANT
GOWN STRL REUS W/TWL LRG LVL3 (GOWN DISPOSABLE) ×6
KIT TURNOVER CYSTO (KITS) ×3 IMPLANT
NEEDLE INSUFFLATION 120MM (ENDOMECHANICALS) ×3 IMPLANT
PACK LAPAROSCOPY BASIN (CUSTOM PROCEDURE TRAY) ×3 IMPLANT
PACK TRENDGUARD 450 HYBRID PRO (MISCELLANEOUS) IMPLANT
PROTECTOR NERVE ULNAR (MISCELLANEOUS) ×6 IMPLANT
SET TUBE SMOKE EVAC HIGH FLOW (TUBING) ×3 IMPLANT
SUT VICRYL 0 UR6 27IN ABS (SUTURE) ×3 IMPLANT
SUT VICRYL 4-0 PS2 18IN ABS (SUTURE) ×3 IMPLANT
TRENDGUARD 450 HYBRID PRO PACK (MISCELLANEOUS)
TROCAR BLADELESS OPT 5 100 (ENDOMECHANICALS) ×3 IMPLANT
TROCAR OPTI TIP 5M 100M (ENDOMECHANICALS) ×6 IMPLANT
TROCAR XCEL DIL TIP R 11M (ENDOMECHANICALS) ×3 IMPLANT
WARMER LAPAROSCOPE (MISCELLANEOUS) ×3 IMPLANT

## 2019-11-22 NOTE — Anesthesia Procedure Notes (Signed)
Procedure Name: Intubation Date/Time: 11/22/2019 3:46 PM Performed by: Raenette Rover, CRNA Pre-anesthesia Checklist: Patient identified, Emergency Drugs available, Suction available and Patient being monitored Patient Re-evaluated:Patient Re-evaluated prior to induction Oxygen Delivery Method: Circle system utilized Preoxygenation: Pre-oxygenation with 100% oxygen Induction Type: IV induction Ventilation: Mask ventilation without difficulty Laryngoscope Size: Mac and 3 Grade View: Grade I Tube type: Oral Tube size: 7.0 mm Number of attempts: 1 Airway Equipment and Method: Stylet Placement Confirmation: ETT inserted through vocal cords under direct vision,  positive ETCO2 and breath sounds checked- equal and bilateral Secured at: 22 cm Tube secured with: Tape Dental Injury: Teeth and Oropharynx as per pre-operative assessment

## 2019-11-22 NOTE — H&P (Signed)
[name] [name] is an 39 y.o. femaleBF presents for LTL with bipolar cautery, IUD removal. Pt desires permanent sterilization. PMH notable for stage IV breast cancer with negative genetic testing  Pertinent Gynecological History: Menses: amenorrhea Bleeding: none Contraception: IUD DES exposure: denies Blood transfusions: none Sexually transmitted diseases: no past history Previous GYN Procedures: n/[name]  Last mammogram: abnormal: breast cancer Date: 08/2018 Last pap: normal Date: 07/2019 OB History: G4P4   Menstrual History: Menarche age: n/[name] No LMP recorded. (Menstrual status: IUD).    Past Medical History:  Diagnosis Date  . Anemia   . History of cardiac murmur as [name] child   . History of Graves' disease    dx hyperthyroidism during 4th pregnancy;   11-15-2013  s/p  total thyroidectomy  . Hypothyroidism, postsurgical 11/15/2013   endocrinologist--- [name] [name]  . Pre-diabetes   . Stage IV breast cancer in female [facility]) 05/2019   oncologist-- [name] [name] ([facility]) bone marrow bx 05-08-2019 and right breast bx 05-17-2020--- primary breast w/ mets to bones, pt taking oral chemo (ibrance)    Past Surgical History:  Procedure Laterality Date  . THYROIDECTOMY Bilateral 11/15/2013   Procedure: TOTAL THYROIDECTOMY;  Surgeon: [name] Regal, [name] [name];  Location: [facility];  Service: General;  Laterality: Bilateral;  . WISDOM TOOTH EXTRACTION  2012    Family History  Problem Relation Age of Onset  . Cancer Mother        Peripheral T cell Lymphoma  . Diabetes Father   . Diabetes Maternal Grandmother     Social History:  reports that she has never smoked. She has never used smokeless tobacco. She reports previous alcohol use. She reports that she does not use drugs.  Allergies: No Known Allergies  Medications Prior to Admission  Medication Sig Dispense Refill Last Dose  . b complex vitamins capsule Take 1 capsule by mouth daily.   11/22/2019 at 0700  . Calcium Carb-Cholecalciferol  (CALCIUM 600 + D PO) Take 4 tablets by mouth daily.   11/21/2019 at Unknown time  . Cholecalciferol (VITAMIN D3) 25 MCG (1000 UT) CAPS Take 3 capsules by mouth daily.   11/21/2019 at Unknown time  . Denosumab (XGEVA [place]) Inject into the skin every 30 (thirty) days.   Past Month at Unknown time  . Goserelin Acetate (ZOLADEX [place]) Inject into the skin every 30 (thirty) days.   Past Week at Unknown time  . letrozole (FEMARA) 2.5 MG tablet Take 2.5 mg by mouth daily.   11/22/2019 at 0700  . levonorgestrel (MIRENA) 20 MCG/24HR IUD 1 each by Intrauterine route once.   11/22/2019 at Unknown time  . levothyroxine (SYNTHROID) 125 MCG tablet Take 125 mcg by mouth every morning.    11/22/2019 at 0700  . palbociclib (IBRANCE) 125 MG capsule Take 125 mg by mouth as directed. Take whole with food. Take for 21 days on, 7 days off, repeat every 28 days.   Past Week at Unknown time    Review of Systems  All other systems reviewed and are negative.   Blood pressure 124/83, pulse 77, temperature 97.9 F (36.6 C), temperature source Oral, resp. rate 16, height 5\' 5"  (1.651 m), weight 78.2 kg, SpO2 100 %. Physical Exam  Constitutional: She is oriented to person, place, and time.  HENT:  Head: Atraumatic.  Cardiovascular: Normal rate and normal heart sounds.  Respiratory: Breath sounds normal.  GI: Soft.  Genitourinary:    Vulva normal.     Genitourinary Comments: Vagina nl Cervix (+)  IUD Uterus AV nl Adnexa nl   Musculoskeletal:     Cervical back: Neck supple.  Neurological: She is alert and oriented to person, place, and time.  Skin: Skin is warm and dry.  Psychiatric: Mood normal.    Results for orders placed or performed during the hospital encounter of 11/22/19 (from the past 24 hour(s))  Pregnancy, urine POC     Status: None   Collection Time: 11/22/19 12:54 PM  Result Value Ref Range   Preg Test, Ur NEGATIVE NEGATIVE  CBC     Status: Abnormal   Collection Time: 11/22/19  1:19 PM  Result Value Ref  Range   WBC 3.8 (L) 4.0 - 10.5 K/uL   RBC 3.88 3.87 - 5.11 MIL/uL   Hemoglobin 12.1 12.0 - 15.0 g/dL   HCT 38.1 36 - 46 %   MCV 98.2 80.0 - 100.0 fL   MCH 31.2 26.0 - 34.0 pg   MCHC 31.8 30.0 - 36.0 g/dL   RDW 20.9 (H) 11.5 - 15.5 %   Platelets 321 150 - 400 K/uL   nRBC 0.0 0.0 - 0.2 %    No results found.  Assessment/Plan: Desires sterilization Stage IV breast cancer Request IUD removal P) LTL with cautery, IUD removal. Risk of surgery includes infection, bleeding, injury to underlying organ structures, thermal injury, failure rate 1/500-1/600 , nonreversible, permanent sterilization. ALL ? answered   [name] [name] [name] [name] 11/22/2019, 2:19 PM

## 2019-11-22 NOTE — Anesthesia Preprocedure Evaluation (Signed)
Anesthesia Evaluation  Patient identified by MRN, date of birth, ID band Patient awake    Reviewed: Allergy & Precautions, NPO status , Patient's Chart, lab work & pertinent test results  Airway Mallampati: II  TM Distance: >3 FB Neck ROM: Full    Dental  (+) Teeth Intact, Dental Advisory Given   Pulmonary neg pulmonary ROS,    Pulmonary exam normal breath sounds clear to auscultation       Cardiovascular negative cardio ROS Normal cardiovascular exam Rhythm:Regular Rate:Normal     Neuro/Psych negative neurological ROS     GI/Hepatic negative GI ROS, Neg liver ROS,   Endo/Other  Hypothyroidism   Renal/GU negative Renal ROS     Musculoskeletal negative musculoskeletal ROS (+)   Abdominal   Peds  Hematology  (+) Blood dyscrasia, anemia ,   Anesthesia Other Findings Day of surgery medications reviewed with the patient.  Stage IV breast cancer  Reproductive/Obstetrics negative OB ROS                             Anesthesia Physical Anesthesia Plan  ASA: II  Anesthesia Plan: General   Post-op Pain Management:    Induction: Intravenous  PONV Risk Score and Plan: 3 and Dexamethasone, Ondansetron and Midazolam  Airway Management Planned: Oral ETT  Additional Equipment:   Intra-op Plan:   Post-operative Plan: Extubation in OR  Informed Consent: I have reviewed the patients History and Physical, chart, labs and discussed the procedure including the risks, benefits and alternatives for the proposed anesthesia with the patient or authorized representative who has indicated his/her understanding and acceptance.     Dental advisory given  Plan Discussed with: CRNA, Anesthesiologist and Surgeon  Anesthesia Plan Comments:         Anesthesia Quick Evaluation

## 2019-11-22 NOTE — Discharge Instructions (Signed)
Laparoscopic Tubal Ligation, Care After This sheet gives you information about how to care for yourself after your procedure. Your health care provider may also give you more specific instructions. If you have problems or questions, contact your health care provider. What can I expect after the procedure? After the procedure, it is common to have:  A sore throat.  Discomfort in your shoulder.  Mild discomfort or cramping in your abdomen.  Gas pains.  Pain or soreness in the area where the surgical incision was made.  A bloated feeling.  Tiredness.  Nausea.  Vomiting. Follow these instructions at home: Medicines  Take over-the-counter and prescription medicines only as told by your health care provider.  Do not take aspirin because it can cause bleeding.  Ask your health care provider if the medicine prescribed to you: ? Requires you to avoid driving or using heavy machinery. ? Can cause constipation. You may need to take actions to prevent or treat constipation, such as:  Drink enough fluid to keep your urine pale yellow.  Take over-the-counter or prescription medicines.  Eat foods that are high in fiber, such as beans, whole grains, and fresh fruits and vegetables.  Limit foods that are high in fat and processed sugars, such as fried or sweet foods. Incision care      Follow instructions from your health care provider about how to take care of your incision. Make sure you: ? Wash your hands with soap and water before and after you change your bandage (dressing). If soap and water are not available, use hand sanitizer. ? Change your dressing as told by your health care provider. ? Leave stitches (sutures), skin glue, or adhesive strips in place. These skin closures may need to stay in place for 2 weeks or longer. If adhesive strip edges start to loosen and curl up, you may trim the loose edges. Do not remove adhesive strips completely unless your health care provider  tells you to do that.  Check your incision area every day for signs of infection. Check for: ? Redness, swelling, or pain. ? Fluid or blood. ? Warmth. ? Pus or a bad smell. Activity  Rest as told by your health care provider.  Avoid sitting for a long time without moving. Get up to take short walks every 1-2 hours. This is important to improve blood flow and breathing. Ask for help if you feel weak or unsteady.  Return to your normal activities as told by your health care provider. Ask your health care provider what activities are safe for you. General instructions  Do not take baths, swim, or use a hot tub until your health care provider approves. Ask your health care provider if you may take showers. You may only be allowed to take sponge baths.  Have someone help you with your daily household tasks for the first few days.  Keep all follow-up visits as told by your health care provider. This is important. Contact a health care provider if:  You have redness, swelling, or pain around your incision.  Your incision feels warm to the touch.  You have pus or a bad smell coming from your incision.  The edges of your incision break open after the sutures have been removed.  Your pain does not improve after 2-3 days.  You have a rash.  You repeatedly become dizzy or light-headed.  Your pain medicine is not helping. Get help right away if you:  Have a fever.  Faint.  Have increasing   pain in your abdomen.  Have severe pain in one or both of your shoulders.  Have fluid or blood coming from your sutures or from your vagina.  Have shortness of breath or difficulty breathing.  Have chest pain or leg pain.  Have ongoing nausea, vomiting, or diarrhea. Summary  After the procedure, it is common to have mild discomfort or cramping in your abdomen.  Take over-the-counter and prescription medicines only as told by your health care provider.  Watch for symptoms that should  prompt you to call your health care provider.  Keep all follow-up visits as told by your health care provider. This is important. This information is not intended to replace advice given to you by your health care provider. Make sure you discuss any questions you have with your health care provider. Document Revised: 10/24/2018 Document Reviewed: 04/11/2018 Elsevier Patient Education  [place] Instructions  Activity: Get plenty of rest for the remainder of the day. A responsible individual must stay with you for 24 hours following the procedure.  For the next 24 hours, DO NOT: -Drive a car -Paediatric nurse -Drink alcoholic beverages -Take any medication unless instructed by your physician -Make any legal decisions or sign important papers.  Meals: Start with liquid foods such as gelatin or soup. Progress to regular foods as tolerated. Avoid greasy, spicy, heavy foods. If nausea and/or vomiting occur, drink only clear liquids until the nausea and/or vomiting subsides. Call your physician if vomiting continues.  Special Instructions/Symptoms: Your throat may feel dry or sore from the anesthesia or the breathing tube placed in your throat during surgery. If this causes discomfort, gargle with warm salt water. The discomfort should disappear within 24 hours.  If you had a scopolamine patch placed behind your ear for the management of post- operative nausea and/or vomiting:  1. The medication in the patch is effective for 72 hours, after which it should be removed.  Wrap patch in a tissue and discard in the trash. Wash hands thoroughly with soap and water. 2. You may remove the patch earlier than 72 hours if you experience unpleasant side effects which may include dry mouth, dizziness or visual disturbances. 3. Avoid touching the patch. Wash your hands with soap and water after contact with the patch.     Next dose of tylenol after 7:30 pm this  evening.

## 2019-11-22 NOTE — Transfer of Care (Signed)
Immediate Anesthesia Transfer of Care Note  Patient: [name] [name]  Procedure(s) Performed: LAPAROSCOPIC TUBAL LIGATION By Cautery (Bilateral Abdomen) INTRAUTERINE DEVICE (IUD) REMOVAL (N/A Abdomen)  Patient Location: PACU  Anesthesia Type:General  Level of Consciousness: awake, alert , oriented, drowsy and patient cooperative  Airway & Oxygen Therapy: Patient Spontanous Breathing and Patient connected to nasal cannula oxygen  Post-op Assessment: Report given to RN and Post -op Vital signs reviewed and stable  Post vital signs: Reviewed and stable  Last Vitals: see PACU VS record; all VSS Vitals Value Taken Time  BP    Temp    Pulse    Resp    SpO2      Last Pain:  Vitals:   11/22/19 1309  TempSrc: Oral  PainSc: 0-No pain      Patients Stated Pain Goal: 5 (94/07/68 0881)  Complications: No complications documented.

## 2019-11-22 NOTE — Brief Op Note (Signed)
11/22/2019  4:41 PM  PATIENT:  [name] [name]  39 y.o. female  PRE-OPERATIVE DIAGNOSIS:  Desires Sterilization, Stage 4 Breast Cancer, request IUD removal  POST-OPERATIVE DIAGNOSIS:  Desires Sterilization, Stage 4 Breast Cancer, request IUD removal  PROCEDURE:  laparoscopic tubal ligation with bipolar cautery, iud removal  SURGEON:  Surgeon(s) and Role:    * Keithon Mccoin, Alanda Slim, MD - Primary  PHYSICIAN ASSISTANT:   ASSISTANTS: none   ANESTHESIA:   general Finding: atrophic vagina, nl tubes and ovaries nl uterus, nl liver EBL:  none   BLOOD ADMINISTERED:none  DRAINS: none   LOCAL MEDICATIONS USED:  MARCAINE     SPECIMEN:  No Specimen  DISPOSITION OF SPECIMEN:  N/A  COUNTS:  YES  TOURNIQUET:  * No tourniquets in log *  DICTATION: .Other Dictation: Dictation Number [id number]  PLAN OF CARE: Admit to inpatient   PATIENT DISPOSITION:  PACU - hemodynamically stable.   Delay start of Pharmacological VTE agent (>24hrs) due to surgical blood loss or risk of bleeding: no

## 2019-11-23 ENCOUNTER — Encounter (HOSPITAL_BASED_OUTPATIENT_CLINIC_OR_DEPARTMENT_OTHER): Payer: Self-pay | Admitting: Obstetrics and Gynecology

## 2019-11-23 NOTE — Op Note (Signed)
NAME: [name] [name], [name] K. MEDICAL RECORD OV:[id number] ACCOUNT [id number] DATE OF BIRTH:June 07, 1980 FACILITY: [facility] LOCATION: [facility] PHYSICIAN:Arohi Salvatierra A. Newel Oien, MD  OPERATIVE REPORT  DATE OF PROCEDURE:  11/22/2019  PREOPERATIVE DIAGNOSES:  Desires sterilization, request removal of intrauterine device, stage IV breast cancer.  PROCEDURE:  Laparoscopic tubal ligation with bipolar cautery, IUD removal.  POSTOPERATIVE DIAGNOSES:  Desires sterilization, request removal of intrauterine device, stage IV breast cancer.  ANESTHESIA:  General.  SURGEON:  Servando Salina, MD  ASSISTANT:  None  DESCRIPTION OF PROCEDURE:  Under adequate general anesthesia, the patient was placed in the dorsal lithotomy position.  She was sterilely prepped and draped in usual fashion.  The bladder was catheterized yielding a moderate amount of urine.  Bivalve  speculum was placed in the vagina.  Single tooth tenaculum was placed on the anterior lip of the cervix.  The IUD was removed intact without any difficulty.  An acorn cannula was introduced into the uterine cavity and attached to the tenaculum for  manipulation of the uterus.  Attention was then turned to the abdomen where 0.25% was injected infraumbilically.  An incision was then made.  A Veress needle was introduced, tested with saline drop and 2.5 liters CO2 was insufflated.  Veress needle was  then removed.  A 10 mm disposable trocar with sleeve was introduced into the abdomen without incident.  A lighted video laparoscope was then inserted.  Panoramic inspection, normal liver edge, uterus was normal.  Normal tubes and ovaries bilaterally.  A suprapubic incision was made, 5 mm port was placed under direct visualization.  Using a Kleppinger, the bipolar cautery, the midportion of the fallopian tubes on both sides were serially clamped, cauterized.  When adequate cauterization was noted on both  sides the suprapubic fat was removed.  The abdomen was  deflated.  The infraumbilical site was removed, taking care not to bring up any underlying structure.  The fascia was closed with 0 Vicryl figure-of-eight suture and the skin approximated with 4-0  Vicryl subcuticular closure.  Instruments from the vagina were removed.  SPECIMEN:  None.  ESTIMATED BLOOD LOSS:  Minimal.  COMPLICATIONS:  None.  DISPOSITION:  The patient tolerated the procedure well and was transferred to recovery room in stable condition.  CN/NUANCE  D:11/22/2019 T:11/23/2019 JOB:[id number]

## 2019-11-23 NOTE — Anesthesia Postprocedure Evaluation (Signed)
Anesthesia Post Note  Patient: [name] [name]  Procedure(s) Performed: LAPAROSCOPIC TUBAL LIGATION By Cautery (Bilateral Abdomen) INTRAUTERINE DEVICE (IUD) REMOVAL (N/A Abdomen)     Patient location during evaluation: PACU Anesthesia Type: General Level of consciousness: sedated and patient cooperative Pain management: pain level controlled Vital Signs Assessment: post-procedure vital signs reviewed and stable Respiratory status: spontaneous breathing Cardiovascular status: stable Anesthetic complications: no   No complications documented.  Last Vitals:  Vitals:   11/22/19 1730 11/22/19 1808  BP: 118/82 126/79  Pulse: 72 80  Resp: 17 16  Temp:  36.7 C  SpO2: 100% 100%    Last Pain:  Vitals:   11/22/19 1808  TempSrc:   PainSc: 0-No pain                 Nolon Nations

## 2019-12-20 DIAGNOSIS — D72829 Elevated white blood cell count, unspecified: Secondary | ICD-10-CM | POA: Diagnosis not present

## 2020-02-17 ENCOUNTER — Encounter: Payer: Self-pay | Admitting: Oncology

## 2020-02-17 DIAGNOSIS — C7951 Secondary malignant neoplasm of bone: Secondary | ICD-10-CM | POA: Insufficient documentation

## 2020-03-06 MED ORDER — GOSERELIN ACETATE 3.6 MG ~~LOC~~ IMPL: 3.6000 mg | DRUG_IMPLANT | Freq: Once | SUBCUTANEOUS | Status: DC

## 2020-03-06 MED ORDER — DENOSUMAB 120 MG/1.7ML ~~LOC~~ SOLN: 120.0000 mg | Freq: Once | SUBCUTANEOUS | Status: DC

## 2020-03-08 ENCOUNTER — Encounter: Payer: Self-pay | Admitting: Pharmacist

## 2020-03-08 DIAGNOSIS — C50811 Malignant neoplasm of overlapping sites of right female breast: Secondary | ICD-10-CM

## 2020-03-08 DIAGNOSIS — Z170 Estrogen receptor positive status [ER+]: Secondary | ICD-10-CM

## 2020-03-10 ENCOUNTER — Other Ambulatory Visit: Payer: Self-pay | Admitting: Oncology

## 2020-03-11 DIAGNOSIS — D72829 Elevated white blood cell count, unspecified: Secondary | ICD-10-CM | POA: Diagnosis not present

## 2020-03-14 ENCOUNTER — Inpatient Hospital Stay: Payer: Commercial Managed Care - PPO | Attending: Oncology

## 2020-03-14 ENCOUNTER — Other Ambulatory Visit: Payer: Self-pay

## 2020-03-14 VITALS — BP 115/73 | HR 91 | Temp 98.3°F | Resp 18

## 2020-03-14 DIAGNOSIS — Z170 Estrogen receptor positive status [ER+]: Secondary | ICD-10-CM | POA: Diagnosis not present

## 2020-03-14 DIAGNOSIS — C50811 Malignant neoplasm of overlapping sites of right female breast: Secondary | ICD-10-CM

## 2020-03-14 DIAGNOSIS — C50111 Malignant neoplasm of central portion of right female breast: Secondary | ICD-10-CM | POA: Insufficient documentation

## 2020-03-14 DIAGNOSIS — Z5111 Encounter for antineoplastic chemotherapy: Secondary | ICD-10-CM | POA: Diagnosis present

## 2020-03-14 DIAGNOSIS — C7951 Secondary malignant neoplasm of bone: Secondary | ICD-10-CM | POA: Diagnosis not present

## 2020-03-14 MED ORDER — DENOSUMAB 120 MG/1.7ML ~~LOC~~ SOLN
SUBCUTANEOUS | Status: AC
  Filled 2020-03-14: qty 1.7

## 2020-03-14 MED ORDER — GOSERELIN ACETATE 3.6 MG ~~LOC~~ IMPL
DRUG_IMPLANT | SUBCUTANEOUS | Status: AC
  Filled 2020-03-14: qty 3.6

## 2020-03-14 MED ORDER — DENOSUMAB 120 MG/1.7ML ~~LOC~~ SOLN
120.0000 mg | Freq: Once | SUBCUTANEOUS | Status: AC
  Administered 2020-03-14: 120 mg via SUBCUTANEOUS

## 2020-03-14 MED ORDER — GOSERELIN ACETATE 3.6 MG ~~LOC~~ IMPL
3.6000 mg | DRUG_IMPLANT | Freq: Once | SUBCUTANEOUS | Status: AC
  Administered 2020-03-14: 3.6 mg via SUBCUTANEOUS

## 2020-03-14 NOTE — Patient Instructions (Signed)
Goserelin injection What is this medicine? GOSERELIN (GOE se rel in) is similar to a hormone found in the body. It lowers the amount of sex hormones that the body makes. Men will have lower testosterone levels and women will have lower estrogen levels while taking this medicine. In men, this medicine is used to treat prostate cancer; the injection is either given once per month or once every 12 weeks. A once per month injection (only) is used to treat women with endometriosis, dysfunctional uterine bleeding, or advanced breast cancer. This medicine may be used for other purposes; ask your health care provider or pharmacist if you have questions. COMMON BRAND NAME(S): Zoladex What should I tell my health care provider before I take this medicine? They need to know if you have any of these conditions:  bone problems  diabetes  heart disease  history of irregular heartbeat  an unusual or allergic reaction to goserelin, other medicines, foods, dyes, or preservatives  pregnant or trying to get pregnant  breast-feeding How should I use this medicine? This medicine is for injection under the skin. It is given by a health care professional in a hospital or clinic setting. Talk to your pediatrician regarding the use of this medicine in children. Special care may be needed. Overdosage: If you think you have taken too much of this medicine contact a poison control center or emergency room at once. NOTE: This medicine is only for you. Do not share this medicine with others. What if I miss a dose? It is important not to miss your dose. Call your doctor or health care professional if you are unable to keep an appointment. What may interact with this medicine? Do not take this medicine with any of the following medications:  cisapride  dronedarone  pimozide  thioridazine This medicine may also interact with the following medications:  other medicines that prolong the QT interval (an abnormal  heart rhythm) This list may not describe all possible interactions. Give your health care provider a list of all the medicines, herbs, non-prescription drugs, or dietary supplements you use. Also tell them if you smoke, drink alcohol, or use illegal drugs. Some items may interact with your medicine. What should I watch for while using this medicine? Visit your doctor or health care provider for regular checks on your progress. Your symptoms may appear to get worse during the first weeks of this therapy. Tell your doctor or healthcare provider if your symptoms do not start to get better or if they get worse after this time. Your bones may get weaker if you take this medicine for a long time. If you smoke or frequently drink alcohol you may increase your risk of bone loss. A family history of osteoporosis, chronic use of drugs for seizures (convulsions), or corticosteroids can also increase your risk of bone loss. Talk to your doctor about how to keep your bones strong. This medicine should stop regular monthly menstruation in women. Tell your doctor if you continue to menstruate. Women should not become pregnant while taking this medicine or for 12 weeks after stopping this medicine. Women should inform their doctor if they wish to become pregnant or think they might be pregnant. There is a potential for serious side effects to an unborn child. Talk to your health care professional or pharmacist for more information. Do not breast-feed an infant while taking this medicine. Men should inform their doctors if they wish to father a child. This medicine may lower sperm counts. Talk  to your health care professional or pharmacist for more information. This medicine may increase blood sugar. Ask your healthcare provider if changes in diet or medicines are needed if you have diabetes. What side effects may I notice from receiving this medicine? Side effects that you should report to your doctor or health care  professional as soon as possible:  allergic reactions like skin rash, itching or hives, swelling of the face, lips, or tongue  bone pain  breathing problems  changes in vision  chest pain  feeling faint or lightheaded, falls  fever, chills  pain, swelling, warmth in the leg  pain, tingling, numbness in the hands or feet  signs and symptoms of high blood sugar such as being more thirsty or hungry or having to urinate more than normal. You may also feel very tired or have blurry vision  signs and symptoms of low blood pressure like dizziness; feeling faint or lightheaded, falls; unusually weak or tired  stomach pain  swelling of the ankles, feet, hands  trouble passing urine or change in the amount of urine  unusually high or low blood pressure  unusually weak or tired Side effects that usually do not require medical attention (report to your doctor or health care professional if they continue or are bothersome):  change in sex drive or performance  changes in breast size in both males and females  changes in emotions or moods  headache  hot flashes  irritation at site where injected  loss of appetite  skin problems like acne, dry skin  vaginal dryness This list may not describe all possible side effects. Call your doctor for medical advice about side effects. You may report side effects to FDA at 1-800-FDA-1088. Where should I keep my medicine? This drug is given in a hospital or clinic and will not be stored at home. NOTE: This sheet is a summary. It may not cover all possible information. If you have questions about this medicine, talk to your doctor, pharmacist, or health care provider.  2020 Elsevier/Gold Standard (2018-09-04 14:05:56) Denosumab injection What is this medicine? DENOSUMAB (den oh sue mab) slows bone breakdown. Prolia is used to treat osteoporosis in women after menopause and in men, and in people who are taking corticosteroids for 6  months or more. Xgeva is used to treat a high calcium level due to cancer and to prevent bone fractures and other bone problems caused by multiple myeloma or cancer bone metastases. Xgeva is also used to treat giant cell tumor of the bone. This medicine may be used for other purposes; ask your health care provider or pharmacist if you have questions. COMMON BRAND NAME(S): Prolia, XGEVA What should I tell my health care provider before I take this medicine? They need to know if you have any of these conditions:  dental disease  having surgery or tooth extraction  infection  kidney disease  low levels of calcium or Vitamin D in the blood  malnutrition  on hemodialysis  skin conditions or sensitivity  thyroid or parathyroid disease  an unusual reaction to denosumab, other medicines, foods, dyes, or preservatives  pregnant or trying to get pregnant  breast-feeding How should I use this medicine? This medicine is for injection under the skin. It is given by a health care professional in a hospital or clinic setting. A special MedGuide will be given to you before each treatment. Be sure to read this information carefully each time. For Prolia, talk to your pediatrician regarding the use   of this medicine in children. Special care may be needed. For Xgeva, talk to your pediatrician regarding the use of this medicine in children. While this drug may be prescribed for children as young as 13 years for selected conditions, precautions do apply. Overdosage: If you think you have taken too much of this medicine contact a poison control center or emergency room at once. NOTE: This medicine is only for you. Do not share this medicine with others. What if I miss a dose? It is important not to miss your dose. Call your doctor or health care professional if you are unable to keep an appointment. What may interact with this medicine? Do not take this medicine with any of the following  medications:  other medicines containing denosumab This medicine may also interact with the following medications:  medicines that lower your chance of fighting infection  steroid medicines like prednisone or cortisone This list may not describe all possible interactions. Give your health care provider a list of all the medicines, herbs, non-prescription drugs, or dietary supplements you use. Also tell them if you smoke, drink alcohol, or use illegal drugs. Some items may interact with your medicine. What should I watch for while using this medicine? Visit your doctor or health care professional for regular checks on your progress. Your doctor or health care professional may order blood tests and other tests to see how you are doing. Call your doctor or health care professional for advice if you get a fever, chills or sore throat, or other symptoms of a cold or flu. Do not treat yourself. This drug may decrease your body's ability to fight infection. Try to avoid being around people who are sick. You should make sure you get enough calcium and vitamin D while you are taking this medicine, unless your doctor tells you not to. Discuss the foods you eat and the vitamins you take with your health care professional. See your dentist regularly. Brush and floss your teeth as directed. Before you have any dental work done, tell your dentist you are receiving this medicine. Do not become pregnant while taking this medicine or for 5 months after stopping it. Talk with your doctor or health care professional about your birth control options while taking this medicine. Women should inform their doctor if they wish to become pregnant or think they might be pregnant. There is a potential for serious side effects to an unborn child. Talk to your health care professional or pharmacist for more information. What side effects may I notice from receiving this medicine? Side effects that you should report to your doctor  or health care professional as soon as possible:  allergic reactions like skin rash, itching or hives, swelling of the face, lips, or tongue  bone pain  breathing problems  dizziness  jaw pain, especially after dental work  redness, blistering, peeling of the skin  signs and symptoms of infection like fever or chills; cough; sore throat; pain or trouble passing urine  signs of low calcium like fast heartbeat, muscle cramps or muscle pain; pain, tingling, numbness in the hands or feet; seizures  unusual bleeding or bruising  unusually weak or tired Side effects that usually do not require medical attention (report to your doctor or health care professional if they continue or are bothersome):  constipation  diarrhea  headache  joint pain  loss of appetite  muscle pain  runny nose  tiredness  upset stomach This list may not describe all possible side   effects. Call your doctor for medical advice about side effects. You may report side effects to FDA at 1-800-FDA-1088. Where should I keep my medicine? This medicine is only given in a clinic, doctor's office, or other health care setting and will not be stored at home. NOTE: This sheet is a summary. It may not cover all possible information. If you have questions about this medicine, talk to your doctor, pharmacist, or health care provider.  2020 Elsevier/Gold Standard (2017-09-23 16:10:44)  

## 2020-03-26 ENCOUNTER — Other Ambulatory Visit: Payer: Self-pay | Admitting: Hematology and Oncology

## 2020-03-26 DIAGNOSIS — D72829 Elevated white blood cell count, unspecified: Secondary | ICD-10-CM | POA: Diagnosis not present

## 2020-03-26 LAB — BASIC METABOLIC PANEL
BUN: 12 (ref 4–21)
CO2: 28 — AB (ref 13–22)
Chloride: 104 (ref 99–108)
Creatinine: 0.8 (ref 0.5–1.1)
Glucose: 112
Potassium: 3.9 (ref 3.4–5.3)
Sodium: 141 (ref 137–147)

## 2020-03-26 LAB — HEPATIC FUNCTION PANEL
ALT: 36 — AB (ref 7–35)
AST: 30 (ref 13–35)
Alkaline Phosphatase: 85 (ref 25–125)
Bilirubin, Total: 0.4

## 2020-03-26 LAB — CBC AND DIFFERENTIAL
HCT: 36 (ref 36–46)
Hemoglobin: 11.3 — AB (ref 12.0–16.0)
Neutrophils Absolute: 2.32
Platelets: 336 (ref 150–399)
WBC: 4.3

## 2020-03-26 LAB — COMPREHENSIVE METABOLIC PANEL
Albumin: 4.3 (ref 3.5–5.0)
Calcium: 9.7 (ref 8.7–10.7)

## 2020-03-26 LAB — CBC: RBC: 3.83 — AB (ref 3.87–5.11)

## 2020-04-02 ENCOUNTER — Inpatient Hospital Stay: Payer: Commercial Managed Care - PPO | Admitting: Oncology

## 2020-04-08 ENCOUNTER — Other Ambulatory Visit: Payer: Self-pay

## 2020-04-08 ENCOUNTER — Telehealth: Payer: Self-pay | Admitting: Oncology

## 2020-04-08 ENCOUNTER — Inpatient Hospital Stay (INDEPENDENT_AMBULATORY_CARE_PROVIDER_SITE_OTHER): Payer: Commercial Managed Care - PPO | Admitting: Hematology and Oncology

## 2020-04-08 ENCOUNTER — Encounter: Payer: Self-pay | Admitting: Hematology and Oncology

## 2020-04-08 ENCOUNTER — Other Ambulatory Visit: Payer: Self-pay | Admitting: Hematology and Oncology

## 2020-04-08 ENCOUNTER — Inpatient Hospital Stay: Payer: Commercial Managed Care - PPO | Attending: Oncology

## 2020-04-08 VITALS — BP 120/72 | HR 71 | Temp 98.0°F | Resp 18 | Ht 65.0 in | Wt 196.2 lb

## 2020-04-08 DIAGNOSIS — C50811 Malignant neoplasm of overlapping sites of right female breast: Secondary | ICD-10-CM

## 2020-04-08 DIAGNOSIS — C50111 Malignant neoplasm of central portion of right female breast: Secondary | ICD-10-CM | POA: Insufficient documentation

## 2020-04-08 DIAGNOSIS — E039 Hypothyroidism, unspecified: Secondary | ICD-10-CM | POA: Diagnosis not present

## 2020-04-08 DIAGNOSIS — Z5111 Encounter for antineoplastic chemotherapy: Secondary | ICD-10-CM | POA: Insufficient documentation

## 2020-04-08 DIAGNOSIS — Z170 Estrogen receptor positive status [ER+]: Secondary | ICD-10-CM

## 2020-04-08 DIAGNOSIS — D649 Anemia, unspecified: Secondary | ICD-10-CM | POA: Insufficient documentation

## 2020-04-08 DIAGNOSIS — Z79811 Long term (current) use of aromatase inhibitors: Secondary | ICD-10-CM | POA: Diagnosis not present

## 2020-04-08 DIAGNOSIS — C7951 Secondary malignant neoplasm of bone: Secondary | ICD-10-CM | POA: Insufficient documentation

## 2020-04-08 DIAGNOSIS — Z79899 Other long term (current) drug therapy: Secondary | ICD-10-CM | POA: Insufficient documentation

## 2020-04-08 LAB — CMP (CANCER CENTER ONLY)
ALT: 17 U/L (ref 0–44)
AST: 19 U/L (ref 15–41)
Albumin: 4 g/dL (ref 3.5–5.0)
Alkaline Phosphatase: 84 U/L (ref 38–126)
Anion gap: 8 (ref 5–15)
BUN: 12 mg/dL (ref 6–20)
CO2: 28 mmol/L (ref 22–32)
Calcium: 9.6 mg/dL (ref 8.9–10.3)
Chloride: 103 mmol/L (ref 98–111)
Creatinine: 0.87 mg/dL (ref 0.44–1.00)
GFR, Estimated: >60 mL/min (ref >60)
Glucose, Bld: 76 mg/dL (ref 70–99)
Potassium: 4.8 mmol/L (ref 3.5–5.1)
Sodium: 139 mmol/L (ref 135–145)
Total Bilirubin: 0.5 mg/dL (ref 0.3–1.2)
Total Protein: 7.8 g/dL (ref 6.5–8.1)

## 2020-04-08 LAB — CBC WITH DIFFERENTIAL (CANCER CENTER ONLY)
Abs Immature Granulocytes: 0.01 10*3/uL (ref 0.00–0.07)
Basophils Absolute: 0.1 10*3/uL (ref 0.0–0.1)
Basophils Relative: 1 %
Eosinophils Absolute: 0.1 10*3/uL (ref 0.0–0.5)
Eosinophils Relative: 2 %
HCT: 38.6 % (ref 36.0–46.0)
Hemoglobin: 11.8 g/dL — ABNORMAL LOW (ref 12.0–15.0)
Immature Granulocytes: 0 %
Lymphocytes Relative: 47 %
Lymphs Abs: 1.7 10*3/uL (ref 0.7–4.0)
MCH: 29.6 pg (ref 26.0–34.0)
MCHC: 30.6 g/dL (ref 30.0–36.0)
MCV: 97 fL (ref 80.0–100.0)
Monocytes Absolute: 0.4 10*3/uL (ref 0.1–1.0)
Monocytes Relative: 10 %
Neutro Abs: 1.5 10*3/uL — ABNORMAL LOW (ref 1.7–7.7)
Neutrophils Relative %: 40 %
Platelet Count: 460 10*3/uL — ABNORMAL HIGH (ref 150–400)
RBC: 3.98 MIL/uL (ref 3.87–5.11)
RDW: 16.6 % — ABNORMAL HIGH (ref 11.5–15.5)
WBC Count: 3.7 10*3/uL — ABNORMAL LOW (ref 4.0–10.5)
nRBC: 0 % (ref 0.0–0.2)

## 2020-04-08 LAB — PREGNANCY, URINE: Preg Test, Ur: NEGATIVE

## 2020-04-08 NOTE — Telephone Encounter (Signed)
Per 11/9 LOS.Appt given to patient. °

## 2020-04-08 NOTE — Progress Notes (Signed)
[facility]  [address] [place],  [place]  [address] [phone]  Clinic Day:  04/10/2020  Referring physician: Derwood Kaplan, MD   CHIEF COMPLAINT:  CC: Follow up prior to treatment of metastatic hormone receptor positive breast cancer.  Current Treatment:   The patient is currently on palbociclib/letrozole in treatment of her metastatic breast cancer, with denosumab every 4 weeks for the bone metastases.  As she is premenopausal, she also receives goserelin every 4 weeks to suppress her ovarian function and induce menopause.  HISTORY OF PRESENT ILLNESS:  [name] [name] is a 39 y.o. female African American female with stage IV (T3 N1 M1) hormone receptor positive right breast cancer diagnosed in December 2020. We began seeing in November 2020 for leukocytosis, anemia and thrombocytopenia.  She had bruising and nose bleeds, as well as a 10-15 lb weight loss.  She had been having chronic back pain for several months, for which she was using ibuprofen or Aleve.  LDH was markedly elevated at 2599, reticulocyte count mildly elevated at 3.4% and haptoglobin was less than 10, which was consistent with hemolysis, but Coombs was negative.  CT chest, abdomen and pelvis revealed diffusely sclerotic osseous structures with multiple lytic lesions noted throughout the axial skeleton.  Some lesions demonstrated expansile soft tissue components, and these findings are highly concerning for multiple myeloma or other osseous metastatic disease of uncertain origin.  A spiculated appearing mass or tissue element was also seen in the lateral right breast, 9 o'clock.  Bone marrow biopsy revealed abundant metastatic carcinoma, which was estrogen receptor positive, so consistent with breast cancer origin.  With that information, it became clear that she had malignant breast cancer that had metastasized to the bone.  She underwent a digital diagnostic bilateral mammogram with  right breast ultrasound in December, which revealed highly suspicious calcifications spanning at least 9.2 cm in the upper outer quadrant of the right breast with distortion associated with the posterior calcifications.  A discrete mass was seen in the right breast with ultrasound at 10 o'clock, 8 cm from the nipple measuring 9 x 6 x 7 mm.  Several mildly abnormal nodes are seen in the right axilla with cortices measuring between 3 and 5 mm.  The lymph nodes were symmetric on mammography and were not definitely different compared to 2015.  Stereotactic needle core biopsy of the right breast and right axilla confirmed grade 2, invasive ductal carcinoma, as well as ductal carcinoma in situ.  The invasive component measured 0.9 cm in greatest linear extent on the core biopsy.  Metastatic carcinoma was involved in one lymph node.  Estrogen receptor positive at 90%, progesterone receptor positive at 1%, and HER2 negative.  Ki67 was 15%.  Nuclear medicine PET scan revealed widespread hypermetabolic bony metastatic disease.  Low level hypermetabolism in bilateral axillary nodes, right greater than left was seen with uptake identified in the ill defined soft tissue lesion in the lateral right breast.  Foci of hypermetabolism identified in the central uterus along the IUD and along the anterior cervix and adjacent small bowel, which were nonspecific.  MRI head did not reveal intracranial metastasis, but diffusely abnormal bone marrow in the skull and cervical spine likely due to metastatic disease was observed.  CA 27-29 was not elevated.   Due to her age of diagnosis, she underwent testing for hereditary breast cancer with the [employer]  myRisk hereditary cancer panel test.  This did not reveal any clinically significant mutation.  There were variants of uncertain significance of the ATM, AXIN2 and MSH3 genes.  She had severer pain, so was placed on methadone, in addition to oxycodone and the doses were titrated up.  We  recommended letrozole/palbociclib, denosumab monthly for the bone metastasis and goserelin monthly to suppress ovarian function due to being premenopausal.  She started letrozole on December 28th and denosumab and goserelin on January 7th.  She started palbociclib for 3 weeks on and one week off on January 9th.   She develops severe hypocalcemia, which required IV calcium replacement. Denosumab had to be held due to the severe hypocalcemia, but was resumed in March.  She was placed on high doses of oral calcium.  She had multiple other issues, such as decreased appetite, nausea and vomiting, as well  As dehydration, managed with medications. MRI thoracic and lumbar spine on January 19th revealed diffusely decreased T1 bone marrow signal with fairly homogeneous enhancement throughout the thoracolumbar spine.  Prior PET-CT demonstrated homogeneous hypermetabolic activity throughout the thoracolumbar spine.  These findings are favored to be related to the patient's anemia rather than diffuse metastatic disease.  There were possible small osseous metastases, notably within the T3 spinous process, left L2 pedicle and upper right sacrum.  There was no significant disc herniation, spinal stenosis or nerve root encroachment.  She was subsequently weaned off methadone and has not had recurrent pain.  She developed cytopenias due to palbociclib and we occasionally had to delay cycles  CT chest, abdomen and pelvis in May revealed diffuse patchy confluent sclerotic osseous metastatic disease throughout the axial and proximal appendicular skeleton, probably representing treatment effect.  ThereWere healed deformities throughout the bilateral ribs.  A new mild right axillary lymphadenopathy was also observed, which wasnonspecific.  No additional sites of metastatic disease were seen in the chest, abdomen or pelvis.  She had a  tubal ligation and removal of her Mirena IUD in June.  CT chest, abdomen and pelvis in September  revealed again widespread osseous sclerosis, indicative of treated metastatic disease.  The previous borderline enlarged right axillary lymph nodes had resolved.  She had recurrent cytopenias, and since we had to delay multiple cycles of palbociclib, we decreased her palbociclib dose to 100 mg daily for 3 weeks on a week off.  She is started her 10th cycle of palbociclib 100 mg on October 21st, at which time her [place] was 1220.  She was seen off schedule on October 27th as she called reporting sneezing, clear nasal discharge and occasional cough productive of clear sputum for 5 days, which she attributes to allergies.  This was not felt to represent pneumonitis due to palbociclib as her exam was normal.  Flonase spray helped her symptoms.   I advised her that she could try Mucinex and/or Claritin or Zyrtec.  INTERVAL HISTORY:  [name] is seen in clinic today prior to her next denosumab and goserelin.  She will complete her 10th cycle of palbociclib tomorrow.  She continues letrozole daily without significant difficulty.  She states her cough has resolved. She denies fever or chills.  She denies pain.  She denies vaginal discharge or bleeding. Her appetite is good. Her weight has been slowly increasing and was 192.9 lb at her last visit, so is up 4 lb. She continues to work her normal job without difficulty.  REVIEW OF SYSTEMS:  Review of Systems  Constitutional: Negative for appetite change, chills, fatigue and fever.  HENT:   Negative for mouth sores and sore throat.  Respiratory: Negative for cough and shortness of breath.   Cardiovascular: Negative for chest pain.  Gastrointestinal: Negative for abdominal pain, constipation, diarrhea, nausea and vomiting.  Genitourinary: Negative for vaginal bleeding and vaginal discharge.   Musculoskeletal: Negative for back pain.  Psychiatric/Behavioral: Negative for depression. The patient is not nervous/anxious.      VITALS:  Blood pressure 120/72, pulse 71,  temperature 98 F (36.7 C), temperature source Oral, resp. rate 18, height _0  (1.651 m), weight 196 lb 3.2 oz (89 kg), SpO2 100 %.  Wt Readings from Last 3 Encounters:  04/08/20 196 lb 3.2 oz (89 kg)  11/22/19 172 lb 8 oz (78.2 kg)  04/28/19 175 lb (79.4 kg)    Body mass index is 32.65 kg/m.  Performance status (ECOG): 0 - Asymptomatic  PHYSICAL EXAM:  Physical Exam Constitutional:      General: She is not in acute distress.    Appearance: Normal appearance.  HENT:     Mouth/Throat:     Mouth: Mucous membranes are moist.     Pharynx: Oropharynx is clear.  Eyes:     Conjunctiva/sclera: Conjunctivae normal.     Pupils: Pupils are equal, round, and reactive to light.  Cardiovascular:     Rate and Rhythm: Normal rate and regular rhythm.     Heart sounds: Normal heart sounds.  Pulmonary:     Effort: Pulmonary effort is normal.     Breath sounds: Normal breath sounds.  Abdominal:     General: There is no distension.     Palpations: Abdomen is soft. There is no hepatomegaly, splenomegaly or mass.     Tenderness: There is no abdominal tenderness.  Musculoskeletal:     Cervical back: Neck supple.     Right lower leg: No edema.     Left lower leg: No edema.  Skin:    Findings: No rash.  Neurological:     General: No focal deficit present.     Mental Status: She is alert and oriented to person, place, and time.  Psychiatric:        Mood and Affect: Mood normal.        Behavior: Behavior normal.   Lymph nodes:   There is no cervical, clavicular, axillary or lymphadenopathy.  Breast exam: Breast exam is deferred.  There is no current tenderness of the right chest wall, but there is a "rope like" soft area below the right breast extending into the upper abdomen.    LABS:   CBC Latest Ref Rng & Units 04/08/2020 03/26/2020 11/22/2019  WBC 4.0 - 10.5 K/uL 3.7(L) 4.3 3.8(L)  Hemoglobin 12.0 - 15.0 g/dL 11.8(L) 11.3(A) 12.1  Hematocrit 36 - 46 % 38.6 36 38.1  Platelets 150  - 400 K/uL 460(H) 336 321   CMP Latest Ref Rng & Units 04/08/2020 03/26/2020 04/28/2019  Glucose 70 - 99 mg/dL 76 - 127(H)  BUN 6 - 20 mg/dL _1 Creatinine 0.44 - 1.00 mg/dL 0.87 0.8 0.90  Sodium 135 - 145 mmol/L 139 141 137  Potassium 3.5 - 5.1 mmol/L 4.8 3.9 3.8  Chloride 98 - 111 mmol/L 103 104 103  CO2 22 - 32 mmol/L 28 28(A) 21(L)  Calcium 8.9 - 10.3 mg/dL 9.6 9.7 10.2  Total Protein 6.5 - 8.1 g/dL 7.8 - 7.6  Total Bilirubin 0.3 - 1.2 mg/dL 0.5 - 1.0  Alkaline Phos 38 - 126 U/L 84 85 115  AST 15 - 41 U/L 19 30 40  ALT 0 -  44 U/L 17 36(A) 20     No results found for: CEA1 / No results found for: CEA1 No results found for: PSA1 No results found for: TGY563 No results found for: CAN125  No results found for: TOTALPROTELP, ALBUMINELP, A1GS, A2GS, BETS, BETA2SER, GAMS, MSPIKE, SPEI No results found for: TIBC, FERRITIN, IRONPCTSAT No results found for: LDH  STUDIES:  No results found.    HISTORY:   Past Medical History:  Diagnosis Date  . Anemia   . History of cardiac murmur as a child   . History of Graves' disease    dx hyperthyroidism during 4th pregnancy;   11-15-2013  s/p  total thyroidectomy  . 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 Pre-diabetes   . Stage IV breast cancer in female [facility]) 05/2019   oncologist-- dr c. Hinton Rao ([facility]) bone marrow bx 05-08-2019 and right breast bx 05-17-2020--- primary breast w/ mets to bones, pt taking oral chemo (ibrance)    Past Surgical History:  Procedure Laterality Date  . IUD REMOVAL N/A 11/22/2019   Procedure: INTRAUTERINE DEVICE (IUD) REMOVAL;  Surgeon: Servando Salina, MD;  Location: [facility];  Service: Gynecology;  Laterality: N/A;  . LAPAROSCOPIC TUBAL LIGATION Bilateral 11/22/2019   Procedure: LAPAROSCOPIC TUBAL LIGATION By Cautery;  Surgeon: Servando Salina, MD;  Location: [facility];  Service: Gynecology;  Laterality:  Bilateral;  . THYROIDECTOMY Bilateral 11/15/2013   Procedure: TOTAL THYROIDECTOMY;  Surgeon: Earnstine Regal, MD;  Location: [facility];  Service: General;  Laterality: Bilateral;  . WISDOM TOOTH EXTRACTION  2012    Family History  Problem Relation Age of Onset  . Cancer Mother        Peripheral T cell Lymphoma  . Diabetes Father   . Diabetes Maternal Grandmother     Social History:  reports that she has never smoked. She has never used smokeless tobacco. She reports previous alcohol use. She reports that she does not use drugs.The patient is alone today.  Allergies: No Known Allergies  Current Medications: Current Outpatient Medications  Medication Sig Dispense Refill  . b complex vitamins capsule Take 1 capsule by mouth daily.    . Calcium Carb-Cholecalciferol (CALCIUM 600 + D PO) Take 4 tablets by mouth daily.    . Cholecalciferol (VITAMIN D3) 25 MCG (1000 UT) CAPS Take 3 capsules by mouth daily.    . Denosumab (XGEVA [place]) Inject into the skin every 30 (thirty) days.    . Goserelin Acetate (ZOLADEX [place]) Inject into the skin every 30 (thirty) days.    Leslee Home 100 MG tablet Take 100 mg by mouth as directed.    Marland Kitchen ibuprofen (ADVIL) 800 MG tablet Take 1 tablet (800 mg total) by mouth every 8 (eight) hours as needed for moderate pain. 30 tablet 5  . letrozole (FEMARA) 2.5 MG tablet Take 2.5 mg by mouth daily.    Marland Kitchen levothyroxine (SYNTHROID) 125 MCG tablet Take 125 mcg by mouth every morning.      No current facility-administered medications for this visit.     ASSESSMENT & PLAN:   Assessment/Plan: 1. Stage IV (T3 N1 M1) hormone receptor positive breast cancer, with metastasis to bone only.  This was discovered after she presented with severe anemia and thrombocytopenia.  She is being treated with letrozole 2.5 mg daily with palbociclib 125 mg once daily for 3 weeks on 1 week off.  Since she is premenopausal, she was started on goserelin injections monthly in January.  She is completing a  10th cycle of palbociclib.   2. Sclerotic and lytic lesions of the bone including T6 and the right iliac wing, as well as ribs with expansile soft tissue components, due to metastatic breast cancer.  She started denosumab in January as well.  She had severe hypocalcemia related to this requiring IV replacement, so denosumab previously had to be delayed on several occasions.  3. Hypocalcemia, previously severe, which has resolved with increasing her calcium supplement. 4. Mild anemia, which continues to improve.  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 5. 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  6. Premenopausal state.  She had her IUD removed and underwent tubal ligation. She is receiving goserelin and has not had any menses.  We will continue to monitor periodic pregnancy tests.   7. Leukopenia and neutropenia secondary to palbociclib, which is mild. 8. Personal history of metastatic breast cancer at age 72. Testing for Hereditary Breast and Ovarian Cancer was done and reveals a variant of uncertain significance (VUS) associated with the ATM, AXIN2, and MSH3 genes. 9. Cough, which has resolved and was likely due to allergies.  She will proceed with goserelin and denosumab on Friday November 12th at the [facility], as she lives and works in [place].  She will complete this cycle of palbociclib and plan to start an 11th cycle next week.   We will plan to see her back in 4 weeks with a CBC and comprehensive metabolic panel prior to her next goserelin and denosumab.  The patient understands the plans discussed today and is in agreement with them.  She knows to contact us if she develops issues requiring immediate clinical assessment.   Marvia Pickles, PA-C

## 2020-04-08 NOTE — Telephone Encounter (Signed)
Scheduled appt per 11/9 LOS - pt is aware of inj appts on 11/12 and 12/10

## 2020-04-11 ENCOUNTER — Inpatient Hospital Stay: Payer: Commercial Managed Care - PPO

## 2020-04-11 ENCOUNTER — Other Ambulatory Visit: Payer: Self-pay

## 2020-04-11 VITALS — BP 120/98 | HR 88 | Resp 18

## 2020-04-11 DIAGNOSIS — C7951 Secondary malignant neoplasm of bone: Secondary | ICD-10-CM

## 2020-04-11 DIAGNOSIS — C50811 Malignant neoplasm of overlapping sites of right female breast: Secondary | ICD-10-CM

## 2020-04-11 DIAGNOSIS — Z5111 Encounter for antineoplastic chemotherapy: Secondary | ICD-10-CM | POA: Diagnosis not present

## 2020-04-11 DIAGNOSIS — Z170 Estrogen receptor positive status [ER+]: Secondary | ICD-10-CM

## 2020-04-11 MED ORDER — DENOSUMAB 120 MG/1.7ML ~~LOC~~ SOLN
SUBCUTANEOUS | Status: AC
  Filled 2020-04-11: qty 1.7

## 2020-04-11 MED ORDER — DENOSUMAB 120 MG/1.7ML ~~LOC~~ SOLN
120.0000 mg | Freq: Once | SUBCUTANEOUS | Status: AC
  Administered 2020-04-11: 120 mg via SUBCUTANEOUS

## 2020-04-11 MED ORDER — GOSERELIN ACETATE 3.6 MG ~~LOC~~ IMPL
3.6000 mg | DRUG_IMPLANT | Freq: Once | SUBCUTANEOUS | Status: AC
  Administered 2020-04-11: 3.6 mg via SUBCUTANEOUS

## 2020-04-11 MED ORDER — GOSERELIN ACETATE 3.6 MG ~~LOC~~ IMPL
DRUG_IMPLANT | SUBCUTANEOUS | Status: AC
  Filled 2020-04-11: qty 3.6

## 2020-04-15 ENCOUNTER — Other Ambulatory Visit: Payer: Self-pay | Admitting: Hematology and Oncology

## 2020-04-15 ENCOUNTER — Telehealth: Payer: Self-pay

## 2020-04-15 DIAGNOSIS — C50811 Malignant neoplasm of overlapping sites of right female breast: Secondary | ICD-10-CM

## 2020-04-15 DIAGNOSIS — Z170 Estrogen receptor positive status [ER+]: Secondary | ICD-10-CM

## 2020-04-15 NOTE — Telephone Encounter (Addendum)
Pt called to ask if she should go ahead and start [facility] tomorrow as scheduled? She states last ANC was little low. [phone]  Pt wants to come for labs at 930 in am. Please add orders.

## 2020-04-16 ENCOUNTER — Inpatient Hospital Stay: Payer: Commercial Managed Care - PPO | Admitting: Hematology and Oncology

## 2020-04-16 ENCOUNTER — Telehealth: Payer: Self-pay

## 2020-04-16 ENCOUNTER — Other Ambulatory Visit: Payer: Self-pay | Admitting: Hematology and Oncology

## 2020-04-16 ENCOUNTER — Telehealth: Payer: Self-pay | Admitting: Oncology

## 2020-04-16 ENCOUNTER — Other Ambulatory Visit: Payer: Self-pay

## 2020-04-16 DIAGNOSIS — C7952 Secondary malignant neoplasm of bone marrow: Secondary | ICD-10-CM

## 2020-04-16 DIAGNOSIS — Z170 Estrogen receptor positive status [ER+]: Secondary | ICD-10-CM

## 2020-04-16 DIAGNOSIS — C50811 Malignant neoplasm of overlapping sites of right female breast: Secondary | ICD-10-CM

## 2020-04-16 DIAGNOSIS — C7951 Secondary malignant neoplasm of bone: Secondary | ICD-10-CM

## 2020-04-16 LAB — COMPREHENSIVE METABOLIC PANEL
Albumin: 4.1 (ref 3.5–5.0)
Calcium: 9.7 (ref 8.7–10.7)

## 2020-04-16 LAB — BASIC METABOLIC PANEL
BUN: 18 (ref 4–21)
CO2: 30 — AB (ref 13–22)
Chloride: 105 (ref 99–108)
Creatinine: 0.6 (ref 0.5–1.1)
Glucose: 108
Potassium: 4 (ref 3.4–5.3)
Sodium: 140 (ref 137–147)

## 2020-04-16 LAB — HEPATIC FUNCTION PANEL
ALT: 24 (ref 7–35)
AST: 29 (ref 13–35)
Alkaline Phosphatase: 78 (ref 25–125)
Bilirubin, Total: 0.2

## 2020-04-16 LAB — CBC: RBC: 3.7 — AB (ref 3.87–5.11)

## 2020-04-16 LAB — CBC AND DIFFERENTIAL
HCT: 35 — AB (ref 36–46)
Hemoglobin: 11.2 — AB (ref 12.0–16.0)
Neutrophils Absolute: 1.65
Platelets: 235 (ref 150–399)
WBC: 4.7

## 2020-04-16 NOTE — Telephone Encounter (Signed)
Yes, can you call her and let her know ok to go ahead? I forgot to tell someone. Thanks!    I notified [place], that [facility], reviewed her labs and wants her to start. 04/16/2020 @ 1640-awc

## 2020-04-17 ENCOUNTER — Telehealth: Payer: Self-pay | Admitting: Oncology

## 2020-04-17 NOTE — Telephone Encounter (Signed)
Received a new pt referral from Dr. Hinton Rao for breast cancer. [name] [name] is transferring her care closer to home from [place]. She has been cld and scheduled to see Dr. Jana Hakim on 12/7 at 4pm w/labs at 330pm.

## 2020-05-05 ENCOUNTER — Other Ambulatory Visit: Payer: Self-pay

## 2020-05-05 DIAGNOSIS — C50811 Malignant neoplasm of overlapping sites of right female breast: Secondary | ICD-10-CM

## 2020-05-05 DIAGNOSIS — Z170 Estrogen receptor positive status [ER+]: Secondary | ICD-10-CM

## 2020-05-05 NOTE — Progress Notes (Signed)
[name] [name]  [phone] [phone]) [phone]     ID: [name] K Puopolo DOB: 1981/04/29  MR#: [id number]  MBT#:[id number]  Patient Care Team: Patient, No Pcp Per as PCP - General (General Practice) Derwood Kaplan, MD as PCP - Hematology/Oncology (Oncology) Evani Shrider, Virgie Dad, MD as Consulting Physician (Oncology) Jacelyn Pi, MD as Referring Physician (Endocrinology) Armandina Gemma, MD as Consulting Physician (General Surgery) Servando Salina, MD as Consulting Physician (Obstetrics and Gynecology) Chauncey Cruel, MD OTHER MD:  CHIEF COMPLAINT: metastatic breast cancer, estrogen receptor positive  CURRENT TREATMENT: letrozole, palbociclib, goserelin, denosumab    INTERVAL HISTORY: [name] was evaluated in the breast cancer clinic on 05/06/2020. She has requested to transfer her care here given she lives and works in [place].  She takes letrozole daily. She has some arthralgias and myalgias related to this, particularly involving her ankles after standing from a prolonged sitting position. She takes the palbociclib currently at 100 mg daily, 21 days on 7 days off. She is not aware of any side effects from this medication except that she gets what sounds like very mild ulcers or at least irritation of her tongue.  She receives denosumab/Xgeva every 4 weeks. She was initially hypocalcemic but now she takes calcium supplementation and has had no further problems with that.  She receives goserelin/Zoladex also every 4weeks. Of course she has hot flashes and some vaginal dryness related to this.  REVIEW OF SYSTEMS: Currently [name] denies unusual headaches, visual changes, nausea, vomiting, stiff neck, dizziness, or gait imbalance. There has been no cough, phlegm production, or pleurisy, no chest pain or pressure, and no change in bowel or bladder habits. The patient denies fever, rash, bleeding, unexplained fatigue or unexplained weight loss. A detailed  review of systems was otherwise entirely negative.   COVID 19 VACCINATION STATUS: Status post Moderna x3, third dose October 2021; booster pending   HISTORY OF CURRENT ILLNESS: From the [facility] note dated 04/10/2020 by Dickey Gave, PA-C:   "[name] [name] is a 39 y.o. female African American female with stage IV (T3 N1 M1) hormone receptor positive right breast cancer diagnosed in December 2020. We began seeing in November 2020 for leukocytosis, anemia and thrombocytopenia.  She had bruising and nose bleeds, as well as a 10-15 lb weight loss.  She had been having chronic back pain for several months, for which she was using ibuprofen or Aleve.  LDH was markedly elevated at 2599, reticulocyte count mildly elevated at 3.4% and haptoglobin was less than 10, which was consistent with hemolysis, but Coombs was negative.  CT chest, abdomen and pelvis revealed diffusely sclerotic osseous structures with multiple lytic lesions noted throughout the axial skeleton.  Some lesions demonstrated expansile soft tissue components, and these findings are highly concerning for multiple myeloma or other osseous metastatic disease of uncertain origin.  A spiculated appearing mass or tissue element was also seen in the lateral right breast, 9 o'clock.  Bone marrow biopsy revealed abundant metastatic carcinoma, which was estrogen receptor positive, so consistent with breast cancer origin.  With that information, it became clear that she had malignant breast cancer that had metastasized to the bone.  She underwent a digital diagnostic bilateral mammogram with right breast ultrasound in December, which revealed highly suspicious calcifications spanning at least 9.2 cm in the upper outer quadrant of the right breast with distortion associated with the posterior calcifications.  A discrete mass was seen in the right breast with ultrasound at 10  o'clock, 8 cm from the nipple measuring 9 x 6 x 7 mm.  Several  mildly abnormal nodes are seen in the right axilla with cortices measuring between 3 and 5 mm.  The lymph nodes were symmetric on mammography and were not definitely different compared to 2015.  Stereotactic needle core biopsy of the right breast and right axilla confirmed grade 2, invasive ductal carcinoma, as well as ductal carcinoma in situ.  The invasive component measured 0.9 cm in greatest linear extent on the core biopsy.  Metastatic carcinoma was involved in one lymph node.  Estrogen receptor positive at 90%, progesterone receptor positive at 1%, and HER2 negative.  Ki67 was 15%.  Nuclear medicine PET scan revealed widespread hypermetabolic bony metastatic disease.  Low level hypermetabolism in bilateral axillary nodes, right greater than left was seen with uptake identified in the ill defined soft tissue lesion in the lateral right breast.  Foci of hypermetabolism identified in the central uterus along the IUD and along the anterior cervix and adjacent small bowel, which were nonspecific.  MRI head did not reveal intracranial metastasis, but diffusely abnormal bone marrow in the skull and cervical spine likely due to metastatic disease was observed.  CA 27-29 was not elevated.   Due to her age of diagnosis, she underwent testing for hereditary breast cancer with the Myriad  myRisk hereditary cancer panel test.  This did not reveal any clinically significant mutation.  There were variants of uncertain significance of the ATM, AXIN2 and MSH3 genes.  She had severer pain, so was placed on methadone, in addition to oxycodone and the doses were titrated up.  We recommended letrozole/palbociclib, denosumab monthly for the bone metastasis and goserelin monthly to suppress ovarian function due to being premenopausal.  She started letrozole on December 28th and denosumab and goserelin on January 7th.  She started palbociclib for 3 weeks on and one week off on January 9th.   She develops severe hypocalcemia, which  required IV calcium replacement. Denosumab had to be held due to the severe hypocalcemia, but was resumed in March.  She was placed on high doses of oral calcium.  She had multiple other issues, such as decreased appetite, nausea and vomiting, as well  As dehydration, managed with medications. MRI thoracic and lumbar spine on January 19th revealed diffusely decreased T1 bone marrow signal with fairly homogeneous enhancement throughout the thoracolumbar spine.  Prior PET-CT demonstrated homogeneous hypermetabolic activity throughout the thoracolumbar spine.  These findings are favored to be related to the patient's anemia rather than diffuse metastatic disease.  There were possible small osseous metastases, notably within the T3 spinous process, left L2 pedicle and upper right sacrum.  There was no significant disc herniation, spinal stenosis or nerve root encroachment.  She was subsequently weaned off methadone and has not had recurrent pain.  She developed cytopenias due to palbociclib and we occasionally had to delay cycles  CT chest, abdomen and pelvis in May revealed diffuse patchy confluent sclerotic osseous metastatic disease throughout the axial and proximal appendicular skeleton, probably representing treatment effect.  ThereWere healed deformities throughout the bilateral ribs.  A new mild right axillary lymphadenopathy was also observed, which wasnonspecific.  No additional sites of metastatic disease were seen in the chest, abdomen or pelvis.  She had a  tubal ligation and removal of her Mirena IUD in June.  CT chest, abdomen and pelvis in September revealed again widespread osseous sclerosis, indicative of treated metastatic disease.  The previous borderline enlarged right axillary  lymph nodes had resolved.  She had recurrent cytopenias, and since we had to delay multiple cycles of palbociclib, we decreased her palbociclib dose to 100 mg daily for 3 weeks on a week off."  The patient's subsequent  history is as detailed below.   PAST MEDICAL HISTORY: Past Medical History:  Diagnosis Date  . Anemia   . History of cardiac murmur as a child   . History of Graves' disease    dx hyperthyroidism during 4th pregnancy;   11-15-2013  s/p  total thyroidectomy  . 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 Pre-diabetes   . Stage IV breast cancer in female [facility] 05/2019   oncologist-- dr c. Hinton Rao ([facility]) bone marrow bx 05-08-2019 and right breast bx 05-17-2020--- primary breast w/ mets to bones, pt taking oral chemo (ibrance)  Tumor associated hemolysis at presentation December 2020  PAST SURGICAL HISTORY: Past Surgical History:  Procedure Laterality Date  . IUD REMOVAL N/A 11/22/2019   Procedure: INTRAUTERINE DEVICE (IUD) REMOVAL;  Surgeon: Servando Salina, MD;  Location: [place];  Service: Gynecology;  Laterality: N/A;  . LAPAROSCOPIC TUBAL LIGATION Bilateral 11/22/2019   Procedure: LAPAROSCOPIC TUBAL LIGATION By Cautery;  Surgeon: Servando Salina, MD;  Location: [place];  Service: Gynecology;  Laterality: Bilateral;  . THYROIDECTOMY Bilateral 11/15/2013   Procedure: TOTAL THYROIDECTOMY;  Surgeon: Earnstine Regal, MD;  Location: [facility];  Service: General;  Laterality: Bilateral;  . WISDOM TOOTH EXTRACTION  2012    FAMILY HISTORY: Family History  Problem Relation Age of Onset  . Cancer Mother        Peripheral T cell Lymphoma  . Diabetes Father   . Diabetes Maternal Grandmother   The patient's mother died at age 35 shortly after her diagnosis of T-cell non-Hodgkin's lymphoma. The patient's father is 65 years old as of December 2020. He has a history of diabetes, is a bilateral amputee, and has a history of EtOH abuse. The patient has 2 half siblings on her mother side and 3/2 siblings on her father's side. None of them have a history of cancer. She has a maternal cousin with a history of B-cell  non-Hodgkin's lymphoma and a maternal grandfather with prostate cancer   GYNECOLOGIC HISTORY:  No LMP recorded. (Menstrual status: IUD). Menarche: 39 years old Age at first live birth: 39 years old [place] P 4 LMP December 2020 (when goserelin started Contraceptive: Status post bilateral tubal ligation HRT n/a  Hysterectomy? no BSO? no   SOCIAL HISTORY: (updated 04/2020)  [name] is divorced. Her former husband Marilene Vath is a Armed forces technical officer for the [employer]. 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 Her daughters were called [name], [place], [place], and [place], age 32-9 as of December 2021. They stay 1 week with the patient in 1 week with her ex-husband. The patient also has a burn doodle called Karlene Lineman. The patient is a Psychologist, forensic    ADVANCED DIRECTIVES: Not in place. At the 05/06/2020 visit the patient was given the appropriate documents to complete and notarized at her discretion.   HEALTH MAINTENANCE: Social History   Tobacco Use  . Smoking status: Never Smoker  . Smokeless tobacco: Never Used  Vaping Use  . Vaping Use: Never used  Substance Use Topics  . Alcohol use: Not Currently    Comment: social  . Drug use: Never     Colonoscopy: n/a (age)  PAP: Up-to-date  Bone density: Remote   No Known Allergies  Current Outpatient Medications  Medication Sig Dispense Refill  . b complex vitamins capsule Take 1 capsule by mouth daily.    . Calcium Carb-Cholecalciferol (CALCIUM 600 + D PO) Take 4 tablets by mouth daily.    . Cholecalciferol (VITAMIN D3) 25 MCG (1000 UT) CAPS Take 3 capsules by mouth daily.    . Denosumab (XGEVA [place]) Inject into the skin every 30 (thirty) days.    . Goserelin Acetate (ZOLADEX [place]) Inject into the skin every 30 (thirty) days.    Leslee Home 100 MG tablet Take 100 mg by mouth as directed.    Marland Kitchen ibuprofen (ADVIL) 800 MG tablet Take 1 tablet (800 mg total) by mouth every 8 (eight) hours as needed for moderate pain. 30 tablet 5  . letrozole (FEMARA) 2.5 MG  tablet Take 2.5 mg by mouth daily.    Marland Kitchen levothyroxine (SYNTHROID) 125 MCG tablet Take 125 mcg by mouth every morning.      No current facility-administered medications for this visit.    OBJECTIVE: White woman who appears stated age  39:   05/06/20 1551  BP: 118/74  Pulse: 81  Resp: 18  Temp: 97.7 F (36.5 C)  SpO2: 100%     Body mass index is 33.83 kg/m.   Wt Readings from Last 3 Encounters:  05/06/20 203 lb 4.8 oz (92.2 kg)  04/08/20 196 lb 3.2 oz (89 kg)  11/22/19 172 lb 8 oz (78.2 kg)      ECOG FS:1 - Symptomatic but completely ambulatory  Ocular: Sclerae unicteric, pupils round and equal Ear-nose-throat: Wearing a mask Lymphatic: No cervical or supraclavicular adenopathy Lungs no rales or rhonchi Heart regular rate and rhythm Abd soft, nontender, positive bowel sounds MSK no focal spinal tenderness, no joint edema Neuro: non-focal, well-oriented, appropriate affect Breasts: I do not palpate a mass in the right breast. There is no skin or nipple change of concern. The left breast is benign. Both axillae are benign.   LAB RESULTS:  CMP     Component Value Date/Time   NA 141 05/06/2020 1532   NA 140 04/16/2020 0000   K 4.0 05/06/2020 1532   CL 106 05/06/2020 1532   CO2 27 05/06/2020 1532   GLUCOSE 85 05/06/2020 1532   BUN 13 05/06/2020 1532   BUN 18 04/16/2020 0000   CREATININE 0.96 05/06/2020 1532   CALCIUM 9.4 05/06/2020 1532   PROT 7.3 05/06/2020 1532   ALBUMIN 3.7 05/06/2020 1532   AST 19 05/06/2020 1532   ALT 19 05/06/2020 1532   ALKPHOS 87 05/06/2020 1532   BILITOT 0.3 05/06/2020 1532   GFRNONAA >60 05/06/2020 1532   GFRAA >60 04/28/2019 1218    No results found for: TOTALPROTELP, ALBUMINELP, A1GS, A2GS, BETS, BETA2SER, GAMS, MSPIKE, SPEI  Lab Results  Component Value Date   WBC 4.3 05/06/2020   NEUTROABS 1.7 05/06/2020   HGB 10.8 (L) 05/06/2020   HCT 33.6 (L) 05/06/2020   MCV 91.8 05/06/2020   PLT 330 05/06/2020    No results  found for: LABCA2  No components found for: MAUQJF354  No results for input(s): INR in the last 168 hours.  No results found for: LABCA2  No results found for: TGY563  No results found for: SLH734  No results found for: KAJ681  No results found for: CA2729  No components found for: HGQUANT  No results found for: CEA1 / No results found for: CEA1   No results found for: AFPTUMOR  No results found for: [facility]  No results found for: KPAFRELGTCHN, LAMBDASER, KAPLAMBRATIO (kappa/lambda light chains)  No results found for: HGBA, HGBA2QUANT, HGBFQUANT, HGBSQUAN (Hemoglobinopathy evaluation)   Lab Results  Component Value Date   LDH 317 (H) 05/06/2020    No results found for: IRON, TIBC, IRONPCTSAT (Iron and TIBC)  No results found for: FERRITIN  Urinalysis    Component Value Date/Time   COLORURINE YELLOW 04/28/2019 1223   APPEARANCEUR HAZY (A) 04/28/2019 1223   LABSPEC 1.024 04/28/2019 1223   PHURINE 5.0 04/28/2019 1223   GLUCOSEU NEGATIVE 04/28/2019 1223   HGBUR NEGATIVE 04/28/2019 1223   BILIRUBINUR NEGATIVE 04/28/2019 [place] 04/28/2019 1223   PROTEINUR NEGATIVE 04/28/2019 1223   NITRITE NEGATIVE 04/28/2019 1223   LEUKOCYTESUR NEGATIVE 04/28/2019 1223     STUDIES: No results found.   ELIGIBLE FOR AVAILABLE RESEARCH PROTOCOL: no  ASSESSMENT: 39 y.o. [place] woman presenting NOV 2020 with stage IV breast cancer as follows:  (a) workup of initial pancytopenia and hemolysis showed a leukoerythroblastic blood picture with bone marrow biopsy 05/07/2020 confirming metastatic carcinoma, estrogen receptor positive; cytogenetics were normal  (b) CT scans at baseline showing multiple bone lesions (both sclerotic and lytic) and a possible mass in the right breast; no definitive lung or liver involvement  (c) right breast upper outer quadrant biopsy x2 and right axillary lymph node biopsy on 05/18/2019 confirmed a clinical T1 N1 M1 stage IV  invasive ductal carcinoma, grade 1-2, estrogen receptor strongly positive, progesterone receptor moderately positive (1%), HER-2 negative, with an MIB-1 of 2-15%  (d) CA 27-29 was not informative  (1) letrozole started 05/28/2019  (a) goserelin started 06/07/2019, repeated every 4 weeks  (b) palbociclib added 06/09/2019  (c) palbociclib dose decreased 200 mg daily, 21/7, with September 2021 cycle  (2) denosumab/Xgeva started 06/07/2019, repeated every 4 weeks  (3) genetics testing (date and results)   PLAN: [name] is now just over a year out from initial diagnosis of metastatic breast cancer. Currently she has no symptoms related to her disease which appears to be well controlled as per the most recent scans, September 2021.  We do need to restage her. I am going to obtain mammography and right breast ultrasonography to follow the measurable disease in the right breast. We are also going to obtain a bone scan which is going to serve as our chief baseline study. We will obtain a CT of the chest to make sure there is no visceral disease.  She is tolerating her treatment moderately well aside from the arthralgias she experiences from letrozole. I am making no changes in her treatment.   She wants to have her eyebrows tattooed.  We looked up the materials used.  As far as I can tell there is no contraindication from a cancer point of view.  She understands that I cannot guarantee she might not develop hepatitis or some other complication from the tattoo.  I commended her new exercise program.  I asked her to bring her genetics results to the next visit so we can incorporate them into her database.  Finally I gave her a copy of the healthcare power of attorney to complete and notarized at her discretion  [name] has a good understanding of the overall plan. She agrees with it. She knows the goal of treatment in her case is controlled. She will call with any problems that may develop before her next  visit here.  Encounter time 65 minutes.   Virgie Dad. Khayden Herzberg, MD 05/06/2020 4:55 PM Medical Oncology  and Hematology [facility] [place], [place] [address] Tel. [phone]    Fax. [phone]   This document serves as a record of services personally performed by Lurline Del, MD. It was created on his behalf by Wilburn Mylar, a trained medical scribe. The creation of this record is based on the scribe's personal observations and the provider's statements to them.   I, Lurline Del MD, have reviewed the above documentation for accuracy and completeness, and I agree with the above.    *Total Encounter Time as defined by the Centers for Medicare and Medicaid Services includes, in addition to the face-to-face time of a patient visit (documented in the note above) non-face-to-face time: obtaining and reviewing outside history, ordering and reviewing medications, tests or procedures, care coordination (communications with other health care professionals or caregivers) and documentation in the medical record.

## 2020-05-06 ENCOUNTER — Inpatient Hospital Stay: Payer: Commercial Managed Care - PPO

## 2020-05-06 ENCOUNTER — Ambulatory Visit: Payer: Commercial Managed Care - PPO | Admitting: Oncology

## 2020-05-06 ENCOUNTER — Other Ambulatory Visit: Payer: Commercial Managed Care - PPO

## 2020-05-06 ENCOUNTER — Inpatient Hospital Stay: Payer: Commercial Managed Care - PPO | Attending: Oncology | Admitting: Oncology

## 2020-05-06 ENCOUNTER — Other Ambulatory Visit: Payer: Self-pay

## 2020-05-06 VITALS — BP 118/74 | HR 81 | Temp 97.7°F | Resp 18 | Ht 65.0 in | Wt 203.3 lb

## 2020-05-06 DIAGNOSIS — C7951 Secondary malignant neoplasm of bone: Secondary | ICD-10-CM

## 2020-05-06 DIAGNOSIS — Z170 Estrogen receptor positive status [ER+]: Secondary | ICD-10-CM

## 2020-05-06 DIAGNOSIS — C50111 Malignant neoplasm of central portion of right female breast: Secondary | ICD-10-CM | POA: Insufficient documentation

## 2020-05-06 DIAGNOSIS — E0500 Thyrotoxicosis with diffuse goiter without thyrotoxic crisis or storm: Secondary | ICD-10-CM

## 2020-05-06 DIAGNOSIS — C50411 Malignant neoplasm of upper-outer quadrant of right female breast: Secondary | ICD-10-CM

## 2020-05-06 DIAGNOSIS — Z79899 Other long term (current) drug therapy: Secondary | ICD-10-CM | POA: Insufficient documentation

## 2020-05-06 DIAGNOSIS — M2550 Pain in unspecified joint: Secondary | ICD-10-CM | POA: Insufficient documentation

## 2020-05-06 DIAGNOSIS — C7952 Secondary malignant neoplasm of bone marrow: Secondary | ICD-10-CM

## 2020-05-06 DIAGNOSIS — Z79818 Long term (current) use of other agents affecting estrogen receptors and estrogen levels: Secondary | ICD-10-CM | POA: Insufficient documentation

## 2020-05-06 DIAGNOSIS — C50811 Malignant neoplasm of overlapping sites of right female breast: Secondary | ICD-10-CM

## 2020-05-06 DIAGNOSIS — D589 Hereditary hemolytic anemia, unspecified: Secondary | ICD-10-CM | POA: Insufficient documentation

## 2020-05-06 DIAGNOSIS — Z79811 Long term (current) use of aromatase inhibitors: Secondary | ICD-10-CM

## 2020-05-06 DIAGNOSIS — D5919 Other autoimmune hemolytic anemia: Secondary | ICD-10-CM

## 2020-05-06 LAB — CMP (CANCER CENTER ONLY)
ALT: 19 U/L (ref 0–44)
AST: 19 U/L (ref 15–41)
Albumin: 3.7 g/dL (ref 3.5–5.0)
Alkaline Phosphatase: 87 U/L (ref 38–126)
Anion gap: 8 (ref 5–15)
BUN: 13 mg/dL (ref 6–20)
CO2: 27 mmol/L (ref 22–32)
Calcium: 9.4 mg/dL (ref 8.9–10.3)
Chloride: 106 mmol/L (ref 98–111)
Creatinine: 0.96 mg/dL (ref 0.44–1.00)
GFR, Estimated: >60 mL/min (ref >60)
Glucose, Bld: 85 mg/dL (ref 70–99)
Potassium: 4 mmol/L (ref 3.5–5.1)
Sodium: 141 mmol/L (ref 135–145)
Total Bilirubin: 0.3 mg/dL (ref 0.3–1.2)
Total Protein: 7.3 g/dL (ref 6.5–8.1)

## 2020-05-06 LAB — LACTATE DEHYDROGENASE: LDH: 317 U/L — ABNORMAL HIGH (ref 98–192)

## 2020-05-06 LAB — CBC WITH DIFFERENTIAL (CANCER CENTER ONLY)
Abs Immature Granulocytes: 0.01 10*3/uL (ref 0.00–0.07)
Basophils Absolute: 0.1 10*3/uL (ref 0.0–0.1)
Basophils Relative: 1 %
Eosinophils Absolute: 0.3 10*3/uL (ref 0.0–0.5)
Eosinophils Relative: 7 %
HCT: 33.6 % — ABNORMAL LOW (ref 36.0–46.0)
Hemoglobin: 10.8 g/dL — ABNORMAL LOW (ref 12.0–15.0)
Immature Granulocytes: 0 %
Lymphocytes Relative: 45 %
Lymphs Abs: 1.9 10*3/uL (ref 0.7–4.0)
MCH: 29.5 pg (ref 26.0–34.0)
MCHC: 32.1 g/dL (ref 30.0–36.0)
MCV: 91.8 fL (ref 80.0–100.0)
Monocytes Absolute: 0.4 10*3/uL (ref 0.1–1.0)
Monocytes Relative: 8 %
Neutro Abs: 1.7 10*3/uL (ref 1.7–7.7)
Neutrophils Relative %: 39 %
Platelet Count: 330 10*3/uL (ref 150–400)
RBC: 3.66 MIL/uL — ABNORMAL LOW (ref 3.87–5.11)
RDW: 14.8 % (ref 11.5–15.5)
WBC Count: 4.3 10*3/uL (ref 4.0–10.5)
nRBC: 0 % (ref 0.0–0.2)

## 2020-05-06 LAB — FOLATE: Folate: 22.6 ng/mL (ref >5.9)

## 2020-05-06 LAB — RETICULOCYTES
Immature Retic Fract: 20.9 % — ABNORMAL HIGH (ref 2.3–15.9)
RBC.: 3.61 MIL/uL — ABNORMAL LOW (ref 3.87–5.11)
Retic Count, Absolute: 36.1 10*3/uL (ref 19.0–186.0)
Retic Ct Pct: 1 % (ref 0.4–3.1)

## 2020-05-06 LAB — SAVE SMEAR(SSMR), FOR PROVIDER SLIDE REVIEW

## 2020-05-06 LAB — VITAMIN B12: Vitamin B-12: 797 pg/mL (ref 180–914)

## 2020-05-06 MED ORDER — IBRANCE 100 MG PO TABS
100.0000 mg | ORAL_TABLET | ORAL | 6 refills | Status: DC
Start: 2020-05-06 — End: 2020-05-07

## 2020-05-06 MED ORDER — LETROZOLE 2.5 MG PO TABS
2.5000 mg | ORAL_TABLET | Freq: Every day | ORAL | 4 refills | Status: DC
Start: 2020-05-06 — End: 2021-01-14

## 2020-05-06 MED ORDER — VALACYCLOVIR HCL 500 MG PO TABS: 500.0000 mg | ORAL_TABLET | Freq: Every day | ORAL | 4 refills | Status: DC

## 2020-05-07 ENCOUNTER — Other Ambulatory Visit: Payer: Self-pay | Admitting: Pharmacist

## 2020-05-07 DIAGNOSIS — Z170 Estrogen receptor positive status [ER+]: Secondary | ICD-10-CM

## 2020-05-07 DIAGNOSIS — C50411 Malignant neoplasm of upper-outer quadrant of right female breast: Secondary | ICD-10-CM

## 2020-05-07 LAB — THYROID PANEL WITH TSH
Free Thyroxine Index: 2.2 (ref 1.2–4.9)
T3 Uptake Ratio: 24 % (ref 24–39)
T4, Total: 9.1 ug/dL (ref 4.5–12.0)
TSH: 1.92 u[IU]/mL (ref 0.450–4.500)

## 2020-05-07 LAB — FERRITIN: Ferritin: 99 ng/mL (ref 11–307)

## 2020-05-07 LAB — CANCER ANTIGEN 27.29: CA 27.29: 10.7 U/mL (ref 0.0–38.6)

## 2020-05-07 LAB — HAPTOGLOBIN: Haptoglobin: 196 mg/dL (ref 42–296)

## 2020-05-07 MED ORDER — IBRANCE 100 MG PO TABS: 100.0000 mg | ORAL_TABLET | ORAL | 6 refills | Status: DC

## 2020-05-07 NOTE — Progress Notes (Signed)
Oral Oncology Pharmacist Encounter  Prescription refill for Ibrance (palbociclib) sent to [facility] in error. Patient's insurance require that medication be filled through [facility]. Prescription redirected to [facility].  Leron Croak, PharmD, BCPS Hematology/Oncology Clinical Pharmacist [facility] [phone] 05/07/2020 10:15 AM

## 2020-05-08 ENCOUNTER — Encounter: Payer: Self-pay | Admitting: Licensed Clinical Social Worker

## 2020-05-08 NOTE — Progress Notes (Signed)
[place] Work  Initial Assessment   [name] [name] is a 39 y.o. year old female contacted by phone. Clinical Social Work was referred by new patient protocol for assessment of psychosocial needs.   SDOH (Social Determinants of Health) assessments performed: Yes   Distress Screen completed: No  Family/Social Information:  . Housing Arrangement: patient lives with four daughters, ages 32-16 yo. Switch off every other week with pt's ex-husband. Names: [name] [name], [name] [name], [name] [name], [name] [name] . Family members/support persons in your life? Significant family support, especially from aunt & sister . Transportation concerns: no  . Employment: Working full time as Corporate treasurer. Income source: Employment . Financial concerns: No o Type of concern: None . Food access concerns: no . Services Currently in place:  Counseling for patient and her children  Coping/ Adjustment to diagnosis: . Patient understands treatment plan and what happens next? yes, has been dealing with stage IV cancer and recently transferred care to be closer to home. Dealing with it as chronic illness currently. Hardest part is mentally, but she is working on focusing on what is going right . Concerns about diagnosis and/or treatment: impact on her children . Patient reported stressors: Children & how cancer will impact them . Hopes and priorities: to continue to be stable and be able to work and enjoy her family . Current coping skills/ strengths: Capable of independent living, Communication skills, General fund of knowledge, Motivation for treatment/growth and Supportive family/friends    SUMMARY: Current SDOH Barriers:  . No significant SDOH barriers noted today  Clinical Social Work Clinical Goal(s):  Marland Kitchen Patient will continue to utilize her professional and familial support system  Interventions: . Discussed common feeling and emotions when being diagnosed with cancer, and the importance of support during treatment . Informed  patient of the support team roles and support services at [facility] E-mailed monthly support calendar. . Provided CSW contact information and encouraged patient to call with any questions or concerns  Follow Up Plan: Patient will contact CSW with any support or resource needs Patient verbalizes understanding of plan: Yes    Christeen Douglas , LCSW

## 2020-05-09 ENCOUNTER — Other Ambulatory Visit: Payer: Self-pay

## 2020-05-09 ENCOUNTER — Inpatient Hospital Stay: Payer: Commercial Managed Care - PPO

## 2020-05-09 VITALS — BP 118/84 | HR 77

## 2020-05-09 DIAGNOSIS — C50811 Malignant neoplasm of overlapping sites of right female breast: Secondary | ICD-10-CM

## 2020-05-09 DIAGNOSIS — C7951 Secondary malignant neoplasm of bone: Secondary | ICD-10-CM

## 2020-05-09 DIAGNOSIS — C50111 Malignant neoplasm of central portion of right female breast: Secondary | ICD-10-CM | POA: Diagnosis not present

## 2020-05-09 DIAGNOSIS — Z170 Estrogen receptor positive status [ER+]: Secondary | ICD-10-CM

## 2020-05-09 MED ORDER — DENOSUMAB 120 MG/1.7ML ~~LOC~~ SOLN
120.0000 mg | Freq: Once | SUBCUTANEOUS | Status: AC
  Administered 2020-05-09: 120 mg via SUBCUTANEOUS

## 2020-05-09 MED ORDER — GOSERELIN ACETATE 3.6 MG ~~LOC~~ IMPL
3.6000 mg | DRUG_IMPLANT | Freq: Once | SUBCUTANEOUS | Status: AC
  Administered 2020-05-09: 3.6 mg via SUBCUTANEOUS

## 2020-05-09 MED ORDER — DENOSUMAB 120 MG/1.7ML ~~LOC~~ SOLN
SUBCUTANEOUS | Status: AC
  Filled 2020-05-09: qty 1.7

## 2020-05-09 MED ORDER — GOSERELIN ACETATE 3.6 MG ~~LOC~~ IMPL
DRUG_IMPLANT | SUBCUTANEOUS | Status: AC
  Filled 2020-05-09: qty 3.6

## 2020-05-10 ENCOUNTER — Other Ambulatory Visit: Payer: Self-pay | Admitting: Oncology

## 2020-05-10 NOTE — Progress Notes (Unsigned)
[place]  [phone] Fax:(336) [phone]     ID: [name] [name] DOB: [birth year]  MR#: [id number]  LUN#:[id number]  Patient Care Team: Patient, No Pcp Per as PCP - General (General Practice) Derwood Kaplan, MD as PCP - Hematology/Oncology (Oncology) Latoia Eyster, Virgie Dad, MD as Consulting Physician (Oncology) Jacelyn Pi, MD as Referring Physician (Endocrinology) Armandina Gemma, MD as Consulting Physician (General Surgery) Servando Salina, MD as Consulting Physician (Obstetrics and Gynecology) Chauncey Cruel, MD OTHER MD:  CHIEF COMPLAINT: metastatic breast cancer, estrogen receptor positive  CURRENT TREATMENT: letrozole, palbociclib, goserelin, denosumab    INTERVAL HISTORY: [name] was evaluated in the breast cancer clinic on 05/06/2020. She has requested to transfer her care here given she lives and works in [place].  She takes letrozole daily. She has some arthralgias and myalgias related to this, particularly involving her ankles after standing from a prolonged sitting position. She takes the palbociclib currently at 100 mg daily, 21 days on 7 days off. She is not aware of any side effects from this medication except that she gets what sounds like very mild ulcers or at least irritation of her tongue.  She receives denosumab/Xgeva every 4 weeks. She was initially hypocalcemic but now she takes calcium supplementation and has had no further problems with that.  She receives goserelin/Zoladex also every 4weeks. Of course she has hot flashes and some vaginal dryness related to this.  REVIEW OF SYSTEMS: Currently [name] [name] denies unusual headaches, visual changes, nausea, vomiting, stiff neck, dizziness, or gait imbalance. There has been no cough, phlegm production, or pleurisy, no chest pain or pressure, and no change in bowel or bladder habits. The patient denies fever, rash, bleeding, unexplained fatigue or unexplained weight loss. A detailed  review of systems was otherwise entirely negative.   COVID 19 VACCINATION STATUS: Status post Moderna x3, third dose October 2021; booster pending   HISTORY OF CURRENT ILLNESS: From the [facility] note dated 04/10/2020 by Dickey Gave, PA-C:   "[name] [name] is a 39 y.o. female African American female with stage IV (T3 N1 M1) hormone receptor positive right breast cancer diagnosed in December 2020. We began seeing in November 2020 for leukocytosis, anemia and thrombocytopenia.  She had bruising and nose bleeds, as well as a 10-15 lb weight loss.  She had been having chronic back pain for several months, for which she was using ibuprofen or Aleve.  LDH was markedly elevated at 2599, reticulocyte count mildly elevated at 3.4% and haptoglobin was less than 10, which was consistent with hemolysis, but Coombs was negative.  CT chest, abdomen and pelvis revealed diffusely sclerotic osseous structures with multiple lytic lesions noted throughout the axial skeleton.  Some lesions demonstrated expansile soft tissue components, and these findings are highly concerning for multiple myeloma or other osseous metastatic disease of uncertain origin.  A spiculated appearing mass or tissue element was also seen in the lateral right breast, 9 o'clock.  Bone marrow biopsy revealed abundant metastatic carcinoma, which was estrogen receptor positive, so consistent with breast cancer origin.  With that information, it became clear that she had malignant breast cancer that had metastasized to the bone.  She underwent a digital diagnostic bilateral mammogram with right breast ultrasound in December, which revealed highly suspicious calcifications spanning at least 9.2 cm in the upper outer quadrant of the right breast with distortion associated with the posterior calcifications.  A discrete mass was seen in the right breast with ultrasound at 10  o'clock, 8 cm from the nipple measuring 9 x 6 x 7 mm.  Several  mildly abnormal nodes are seen in the right axilla with cortices measuring between 3 and 5 mm.  The lymph nodes were symmetric on mammography and were not definitely different compared to 2015.  Stereotactic needle core biopsy of the right breast and right axilla confirmed grade 2, invasive ductal carcinoma, as well as ductal carcinoma in situ.  The invasive component measured 0.9 cm in greatest linear extent on the core biopsy.  Metastatic carcinoma was involved in one lymph node.  Estrogen receptor positive at 90%, progesterone receptor positive at 1%, and HER2 negative.  Ki67 was 15%.  Nuclear medicine PET scan revealed widespread hypermetabolic bony metastatic disease.  Low level hypermetabolism in bilateral axillary nodes, right greater than left was seen with uptake identified in the ill defined soft tissue lesion in the lateral right breast.  Foci of hypermetabolism identified in the central uterus along the IUD and along the anterior cervix and adjacent small bowel, which were nonspecific.  MRI head did not reveal intracranial metastasis, but diffusely abnormal bone marrow in the skull and cervical spine likely due to metastatic disease was observed.  CA 27-29 was not elevated.   Due to her age of diagnosis, she underwent testing for hereditary breast cancer with the Myriad  myRisk hereditary cancer panel test.  This did not reveal any clinically significant mutation.  There were variants of uncertain significance of the ATM, AXIN2 and MSH3 genes.  She had severer pain, so was placed on methadone, in addition to oxycodone and the doses were titrated up.  We recommended letrozole/palbociclib, denosumab monthly for the bone metastasis and goserelin monthly to suppress ovarian function due to being premenopausal.  She started letrozole on December 28th and denosumab and goserelin on January 7th.  She started palbociclib for 3 weeks on and one week off on January 9th.   She develops severe hypocalcemia, which  required IV calcium replacement. Denosumab had to be held due to the severe hypocalcemia, but was resumed in March.  She was placed on high doses of oral calcium.  She had multiple other issues, such as decreased appetite, nausea and vomiting, as well  As dehydration, managed with medications. MRI thoracic and lumbar spine on January 19th revealed diffusely decreased T1 bone marrow signal with fairly homogeneous enhancement throughout the thoracolumbar spine.  Prior PET-CT demonstrated homogeneous hypermetabolic activity throughout the thoracolumbar spine.  These findings are favored to be related to the patient's anemia rather than diffuse metastatic disease.  There were possible small osseous metastases, notably within the T3 spinous process, left L2 pedicle and upper right sacrum.  There was no significant disc herniation, spinal stenosis or nerve root encroachment.  She was subsequently weaned off methadone and has not had recurrent pain.  She developed cytopenias due to palbociclib and we occasionally had to delay cycles  CT chest, abdomen and pelvis in May revealed diffuse patchy confluent sclerotic osseous metastatic disease throughout the axial and proximal appendicular skeleton, probably representing treatment effect.  ThereWere healed deformities throughout the bilateral ribs.  A new mild right axillary lymphadenopathy was also observed, which wasnonspecific.  No additional sites of metastatic disease were seen in the chest, abdomen or pelvis.  She had a  tubal ligation and removal of her Mirena IUD in June.  CT chest, abdomen and pelvis in September revealed again widespread osseous sclerosis, indicative of treated metastatic disease.  The previous borderline enlarged right axillary  lymph nodes had resolved.  She had recurrent cytopenias, and since we had to delay multiple cycles of palbociclib, we decreased her palbociclib dose to 100 mg daily for 3 weeks on a week off."  The patient's subsequent  history is as detailed below.   PAST MEDICAL HISTORY: Past Medical History:  Diagnosis Date  . Anemia   . History of cardiac murmur as a child   . History of Graves' disease    dx hyperthyroidism during 4th pregnancy;   11-15-2013  s/p  total thyroidectomy  . 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 Pre-diabetes   . Stage IV breast cancer in female [facility] 05/2019   oncologist-- dr c. Hinton Rao ([facility]) bone marrow bx 05-08-2019 and right breast bx 05-17-2020--- primary breast w/ mets to bones, pt taking oral chemo (ibrance)  Tumor associated hemolysis at presentation December 2020  PAST SURGICAL HISTORY: Past Surgical History:  Procedure Laterality Date  . IUD REMOVAL N/A 11/22/2019   Procedure: INTRAUTERINE DEVICE (IUD) REMOVAL;  Surgeon: Servando Salina, MD;  Location: [place];  Service: Gynecology;  Laterality: N/A;  . LAPAROSCOPIC TUBAL LIGATION Bilateral 11/22/2019   Procedure: LAPAROSCOPIC TUBAL LIGATION By Cautery;  Surgeon: Servando Salina, MD;  Location: [place];  Service: Gynecology;  Laterality: Bilateral;  . THYROIDECTOMY Bilateral 11/15/2013   Procedure: TOTAL THYROIDECTOMY;  Surgeon: Earnstine Regal, MD;  Location: [facility];  Service: General;  Laterality: Bilateral;  . WISDOM TOOTH EXTRACTION  2012    FAMILY HISTORY: Family History  Problem Relation Age of Onset  . Cancer Mother        Peripheral T cell Lymphoma  . Diabetes Father   . Diabetes Maternal Grandmother   The patient's mother died at age 35 shortly after her diagnosis of T-cell non-Hodgkin's lymphoma. The patient's father is 65 years old as of December 2020. He has a history of diabetes, is a bilateral amputee, and has a history of EtOH abuse. The patient has 2 half siblings on her mother side and 3/2 siblings on her father's side. None of them have a history of cancer. She has a maternal cousin with a history of B-cell  non-Hodgkin's lymphoma and a maternal grandfather with prostate cancer   GYNECOLOGIC HISTORY:  No LMP recorded. (Menstrual status: IUD). Menarche: 39 years old Age at first live birth: 39 years old [place] P 4 LMP December 2020 (when goserelin started Contraceptive: Status post bilateral tubal ligation HRT n/a  Hysterectomy? no BSO? no   SOCIAL HISTORY: (updated 04/2020)  [name] is divorced. Her former husband Marilene Vath is a Armed forces technical officer for the [employer]. 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 Her daughters were called [name], [place], [place], and [place], age 32-9 as of December 2021. They stay 1 week with the patient in 1 week with her ex-husband. The patient also has a burn doodle called Karlene Lineman. The patient is a Psychologist, forensic    ADVANCED DIRECTIVES: Not in place. At the 05/06/2020 visit the patient was given the appropriate documents to complete and notarized at her discretion.   HEALTH MAINTENANCE: Social History   Tobacco Use  . Smoking status: Never Smoker  . Smokeless tobacco: Never Used  Vaping Use  . Vaping Use: Never used  Substance Use Topics  . Alcohol use: Not Currently    Comment: social  . Drug use: Never     Colonoscopy: n/a (age)  PAP: Up-to-date  Bone density: Remote   No Known Allergies  Current Outpatient Medications  Medication Sig Dispense Refill  . b complex vitamins capsule Take 1 capsule by mouth daily.    . Calcium Carb-Cholecalciferol (CALCIUM 600 + D PO) Take 4 tablets by mouth daily.    . Cholecalciferol (VITAMIN D3) 25 MCG (1000 UT) CAPS Take 3 capsules by mouth daily.    . Denosumab (XGEVA [place]) Inject into the skin every 30 (thirty) days.    . Goserelin Acetate (ZOLADEX [place]) Inject into the skin every 30 (thirty) days.    [name] [name] 100 MG tablet Take 1 tablet (100 mg total) by mouth as directed. 21 tablet 6  . ibuprofen (ADVIL) 800 MG tablet Take 1 tablet (800 mg total) by mouth every 8 (eight) hours as needed for moderate pain. 30 tablet 5   . letrozole (FEMARA) 2.5 MG tablet Take 1 tablet (2.5 mg total) by mouth daily. 90 tablet 4  . levothyroxine (SYNTHROID) 125 MCG tablet Take 125 mcg by mouth every morning.     . valACYclovir (VALTREX) 500 MG tablet Take 1 tablet (500 mg total) by mouth daily. 90 tablet 4   No current facility-administered medications for this visit.    OBJECTIVE: White woman who appears stated age  There were no vitals filed for this visit.   There is no height or weight on file to calculate BMI.   Wt Readings from Last 3 Encounters:  05/06/20 203 lb 4.8 oz (92.2 kg)  04/08/20 196 lb 3.2 oz (89 kg)  11/22/19 172 lb 8 oz (78.2 kg)      ECOG FS:1 - Symptomatic but completely ambulatory  Ocular: Sclerae unicteric, pupils round and equal Ear-nose-throat: Wearing a mask Lymphatic: No cervical or supraclavicular adenopathy Lungs no rales or rhonchi Heart regular rate and rhythm Abd soft, nontender, positive bowel sounds MSK no focal spinal tenderness, no joint edema Neuro: non-focal, well-oriented, appropriate affect Breasts: I do not palpate a mass in the right breast. There is no skin or nipple change of concern. The left breast is benign. Both axillae are benign.   LAB RESULTS:  CMP     Component Value Date/Time   NA 141 05/06/2020 1532   NA 140 04/16/2020 0000   K 4.0 05/06/2020 1532   CL 106 05/06/2020 1532   CO2 27 05/06/2020 1532   GLUCOSE 85 05/06/2020 1532   BUN 13 05/06/2020 1532   BUN 18 04/16/2020 0000   CREATININE 0.96 05/06/2020 1532   CALCIUM 9.4 05/06/2020 1532   PROT 7.3 05/06/2020 1532   ALBUMIN 3.7 05/06/2020 1532   AST 19 05/06/2020 1532   ALT 19 05/06/2020 1532   ALKPHOS 87 05/06/2020 1532   BILITOT 0.3 05/06/2020 1532   GFRNONAA >60 05/06/2020 1532   GFRAA >60 04/28/2019 1218    No results found for: TOTALPROTELP, ALBUMINELP, A1GS, A2GS, BETS, BETA2SER, GAMS, MSPIKE, SPEI  Lab Results  Component Value Date   WBC 4.3 05/06/2020   NEUTROABS 1.7 05/06/2020    HGB 10.8 (L) 05/06/2020   HCT 33.6 (L) 05/06/2020   MCV 91.8 05/06/2020   PLT 330 05/06/2020    No results found for: LABCA2  No components found for: ONGEXB284  No results for input(s): INR in the last 168 hours.  No results found for: LABCA2  No results found for: XLK440  No results found for: CAN125  No results found for: NUU725  Lab Results  Component Value Date   CA2729 10.7 05/06/2020    No components found for: HGQUANT  No results  found for: CEA1 / No results found for: CEA1   No results found for: AFPTUMOR  No results found for: CHROMOGRNA  No results found for: KPAFRELGTCHN, LAMBDASER, KAPLAMBRATIO (kappa/lambda light chains)  No results found for: HGBA, HGBA2QUANT, HGBFQUANT, HGBSQUAN (Hemoglobinopathy evaluation)   Lab Results  Component Value Date   LDH 317 (H) 05/06/2020    No results found for: IRON, TIBC, IRONPCTSAT (Iron and TIBC)  Lab Results  Component Value Date   FERRITIN 99 05/06/2020    Urinalysis    Component Value Date/Time   COLORURINE YELLOW 04/28/2019 1223   APPEARANCEUR HAZY (A) 04/28/2019 1223   LABSPEC 1.024 04/28/2019 1223   PHURINE 5.0 04/28/2019 [place] 04/28/2019 1223   HGBUR NEGATIVE 04/28/2019 [place] 04/28/2019 [place] 04/28/2019 [place] 04/28/2019 1223   NITRITE NEGATIVE 04/28/2019 [place] 04/28/2019 1223     STUDIES: No results found.   ELIGIBLE FOR AVAILABLE RESEARCH PROTOCOL: no  ASSESSMENT: 39 y.o. [place] woman presenting NOV 2020 with stage IV breast cancer as follows:  (a) workup of initial pancytopenia and hemolysis showed a leukoerythroblastic blood picture with bone marrow biopsy 05/07/2020 confirming metastatic carcinoma, estrogen receptor positive; cytogenetics were normal  (b) CT scans at baseline showing multiple bone lesions (both sclerotic and lytic) and a possible mass in the right breast;  no definitive lung or liver involvement  (c) right breast upper outer quadrant biopsy x2 and right axillary lymph node biopsy on 05/18/2019 confirmed a clinical 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, grade 1-2, estrogen receptor strongly positive, progesterone receptor moderately positive (1%), HER-2 negative, with an MIB-1 of 2-15%  (d) CA 27-29 was not informative  (1) letrozole started 05/28/2019  (a) goserelin started 06/07/2019, repeated every 4 weeks  (b) palbociclib added 06/09/2019  (c) palbociclib dose decreased 200 mg daily, 21/7, with September 2021 cycle  (2) denosumab/Xgeva started 06/07/2019, repeated every 4 weeks  (3) genetics testing (date and results)  (4) restaging studies:  (a) CT scan of the chest abdomen and pelvis with contrast 02/12/2020 at [facility] showed widespread sclerotic bony disease indicative of treated metastates, no visceral disease no evidence of disease progression.  There was a 0.9 cm lesion in the left hepatic lobe which was unchanged from prior and too small to characterize.   PLAN: [name] is now just over a year out from initial diagnosis of metastatic breast cancer. Currently she has no symptoms related to her disease which appears to be well controlled as per the most recent scans, September 2021.  We do need to restage her. I am going to obtain mammography and right breast ultrasonography to follow the measurable disease in the right breast. We are also going to obtain a bone scan which is going to serve as our chief baseline study. We will obtain a CT of the chest to make sure there is no visceral disease.  She is tolerating her treatment moderately well aside from the arthralgias she experiences from letrozole. I am making no changes in her treatment.   She wants to have her eyebrows tattooed.  We looked up the materials used.  As far as I can tell there is no contraindication from a cancer point of view.  She understands that I cannot  guarantee she might not develop hepatitis or some other complication from the tattoo.  I commended her new exercise program.  I asked her to bring  her genetics results to the next visit so we can incorporate them into her database.  Finally I gave her a copy of the healthcare power of attorney to complete and notarized at her discretion  [name] has a good understanding of the overall plan. She agrees with it. She knows the goal of treatment in her case is controlled. She will call with any problems that may develop before her next visit here.  Encounter time 65 minutes.   Virgie Dad. Magrinat, MD 05/10/2020 10:59 AM Medical Oncology and Hematology [facility] [place],  [address] Tel. [phone]    Fax. [phone]   This document serves as a record of services personally performed by Lurline Del, MD. It was created on his behalf by Wilburn Mylar, a trained medical scribe. The creation of this record is based on the scribe's personal observations and the provider's statements to them.   I, Lurline Del MD, have reviewed the above documentation for accuracy and completeness, and I agree with the above.    *Total Encounter Time as defined by the Centers for Medicare and Medicaid Services includes, in addition to the face-to-face time of a patient visit (documented in the note above) non-face-to-face time: obtaining and reviewing outside history, ordering and reviewing medications, tests or procedures, care coordination (communications with other health care professionals or caregivers) and documentation in the medical record.

## 2020-05-21 ENCOUNTER — Encounter (HOSPITAL_COMMUNITY): Payer: Self-pay

## 2020-05-21 ENCOUNTER — Encounter (HOSPITAL_COMMUNITY)
Admission: RE | Admit: 2020-05-21 | Discharge: 2020-05-21 | Disposition: A | Discharge status: Home / Self Care | Payer: Commercial Managed Care - PPO | Source: Ambulatory Visit | Attending: Oncology | Admitting: Oncology

## 2020-05-21 ENCOUNTER — Other Ambulatory Visit: Payer: Self-pay

## 2020-05-21 ENCOUNTER — Ambulatory Visit (HOSPITAL_COMMUNITY)
Admission: RE | Admit: 2020-05-21 | Discharge: 2020-05-21 | Disposition: A | Discharge status: Home / Self Care | Payer: Commercial Managed Care - PPO | Source: Ambulatory Visit | Attending: Oncology | Admitting: Oncology

## 2020-05-21 DIAGNOSIS — E0500 Thyrotoxicosis with diffuse goiter without thyrotoxic crisis or storm: Secondary | ICD-10-CM

## 2020-05-21 DIAGNOSIS — C50811 Malignant neoplasm of overlapping sites of right female breast: Secondary | ICD-10-CM

## 2020-05-21 DIAGNOSIS — C50411 Malignant neoplasm of upper-outer quadrant of right female breast: Secondary | ICD-10-CM | POA: Diagnosis present

## 2020-05-21 DIAGNOSIS — C7952 Secondary malignant neoplasm of bone marrow: Secondary | ICD-10-CM | POA: Insufficient documentation

## 2020-05-21 DIAGNOSIS — D5919 Other autoimmune hemolytic anemia: Secondary | ICD-10-CM

## 2020-05-21 DIAGNOSIS — C7951 Secondary malignant neoplasm of bone: Secondary | ICD-10-CM

## 2020-05-21 DIAGNOSIS — Z170 Estrogen receptor positive status [ER+]: Secondary | ICD-10-CM | POA: Insufficient documentation

## 2020-05-21 IMAGING — NM NM BONE WHOLE BODY
2 series · 2 of 2 positions shown · non-contrast
Comparison: PET-CT [DATE], thoracolumbar MRI [DATE] and
chest CT [DATE].

CLINICAL DATA: Metastatic breast cancer. Evaluate status of osseous
metastatic disease.

EXAM:
NUCLEAR MEDICINE WHOLE BODY BONE SCAN
TECHNIQUE: Whole body anterior and posterior images were obtained approximately
3 hours after intravenous injection of radiopharmaceutical.
RADIOPHARMACEUTICALS:  21.8 mCi [RP] MDP IV

[Series 1: whole body · 2.66mm/px · 1 of 1 slices shown (1 of 2)]
[im 1/1]
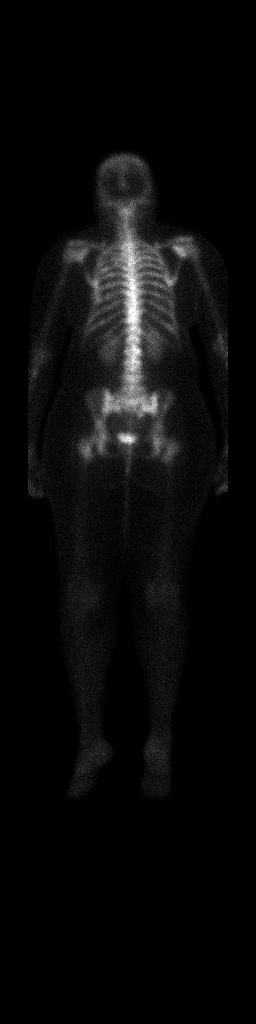

[Series 1: whole body · 2.66mm/px · 1 of 1 slices shown (2 of 2)]
[im 1/1]
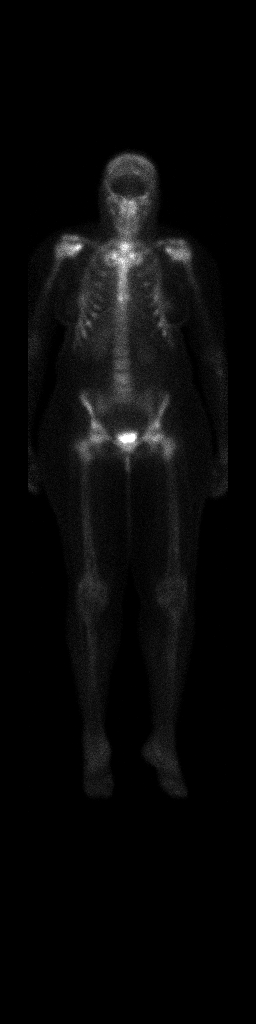

[2 of 2 positions shown; findings below may reference images not displayed]

FINDINGS: The osseous uptake is homogeneous without focal areas of increased
or decreased uptake. In correlation with previous imaging
demonstrating diffuse osseous sclerosis, these findings are most
consistent with treated metastatic disease. The soft tissue activity
is within normal limits.
IMPRESSION: Homogeneous osseous uptake, consistent with treated widespread
osseous metastatic disease based on previous imaging.

## 2020-05-21 IMAGING — CT CT CHEST W/ CM
2 of 4 series · 15 of 36 positions shown, 18 images · IV contrast (OMNIPAQUE)
Comparison: CT chest, [DATE], [DATE], PET-CT, [DATE]

CLINICAL DATA: Metastatic lung cancer restaging

EXAM:
CT CHEST WITH CONTRAST
TECHNIQUE: Multidetector CT imaging of the chest was performed during
intravenous contrast administration.
CONTRAST:  75mL OMNIPAQUE IOHEXOL 300 MG/ML  SOLN

[Series 2: axial st · axial · 0.64mm/px · z∈[+113,+333]mm · 12 of 132 slices shown, 15 images]
[im 11/132  mediastinal]
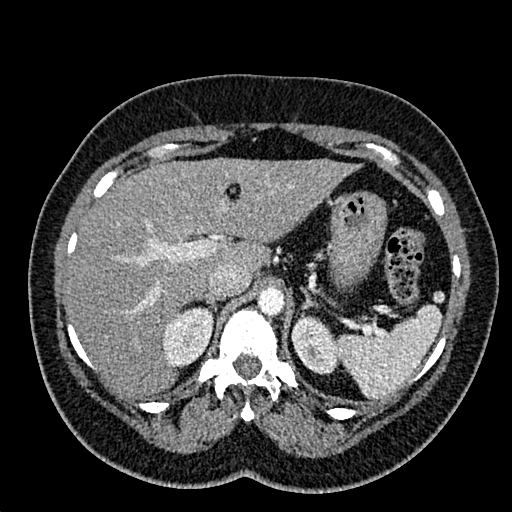
[im 11/132  lung]
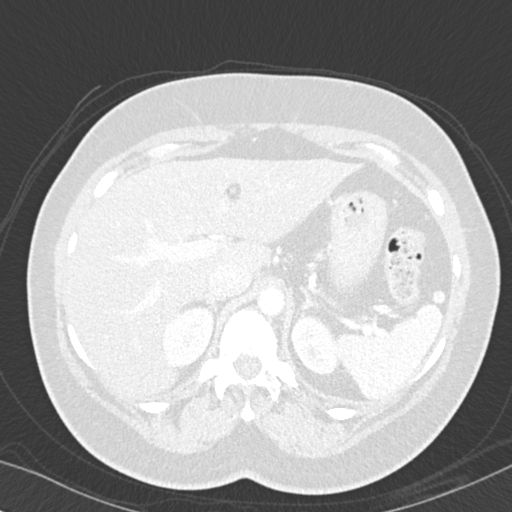
[im 21/132  lung]
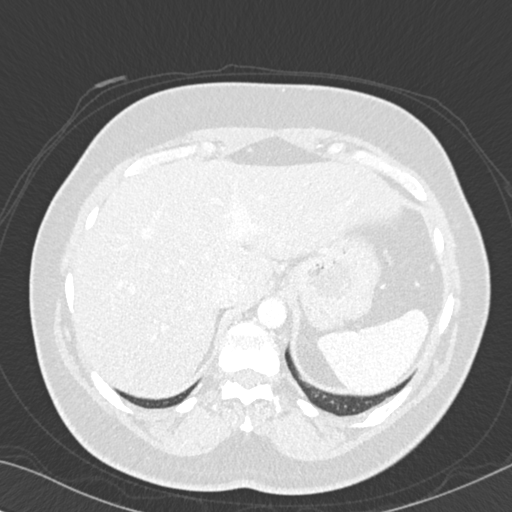
[im 31/132  lung]
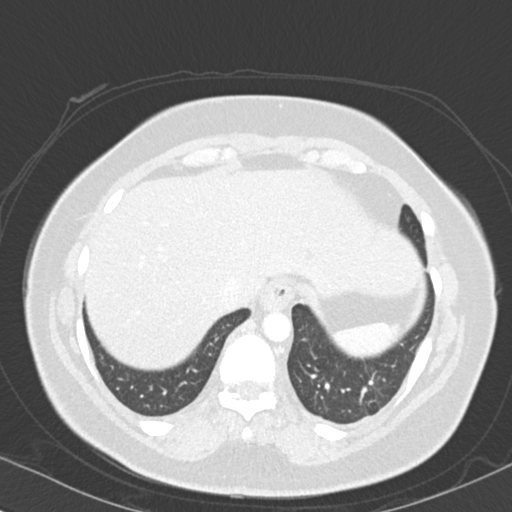
[im 41/132  lung]
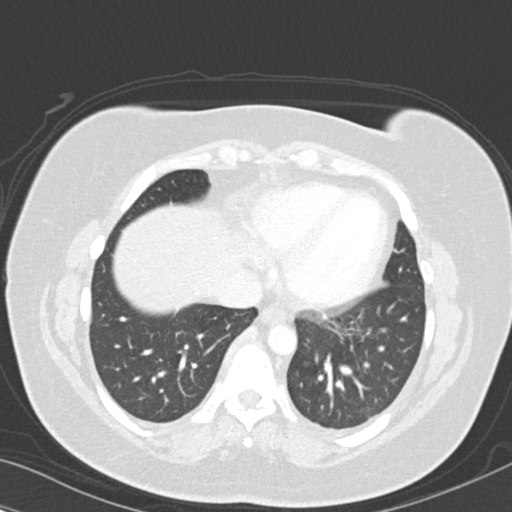
[im 51/132  mediastinal]
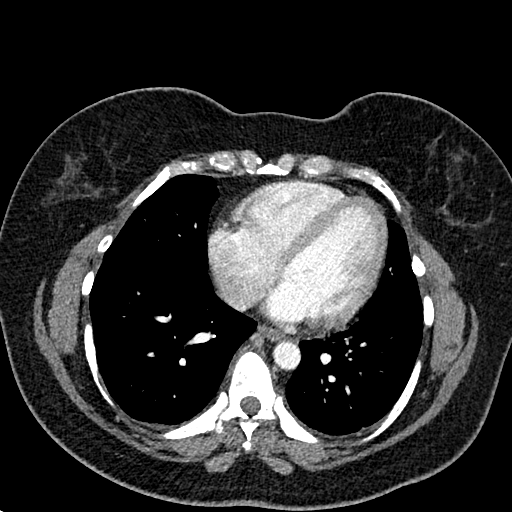
[im 51/132  lung]
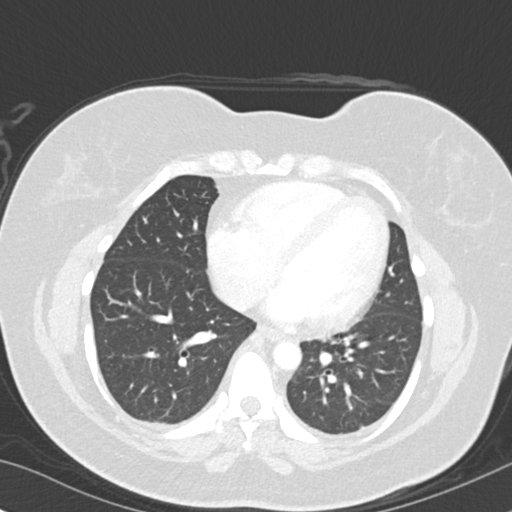
[im 61/132  lung]
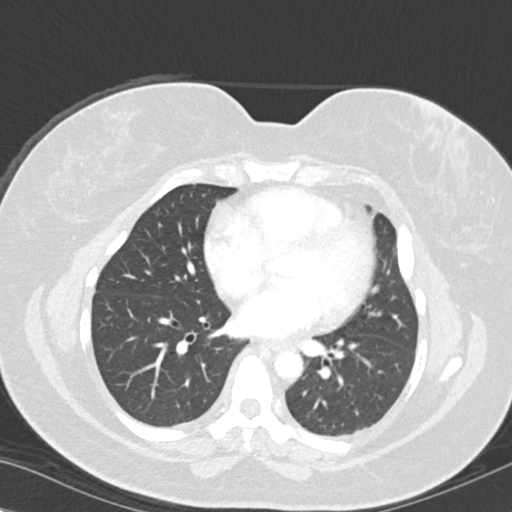
[im 71/132  lung]
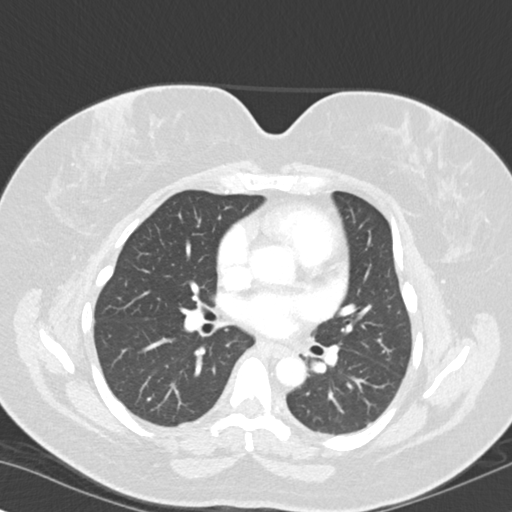
[im 81/132  lung]
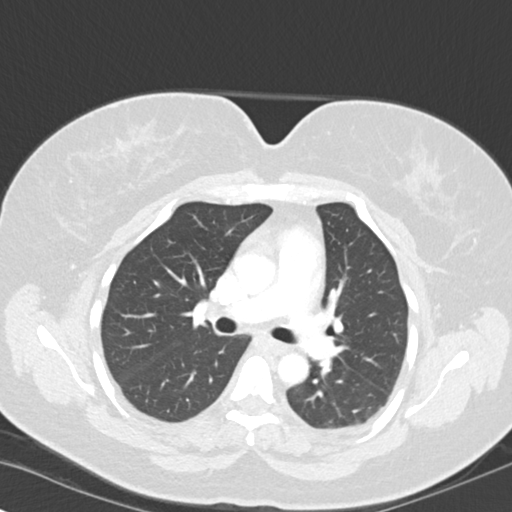
[im 91/132  mediastinal]
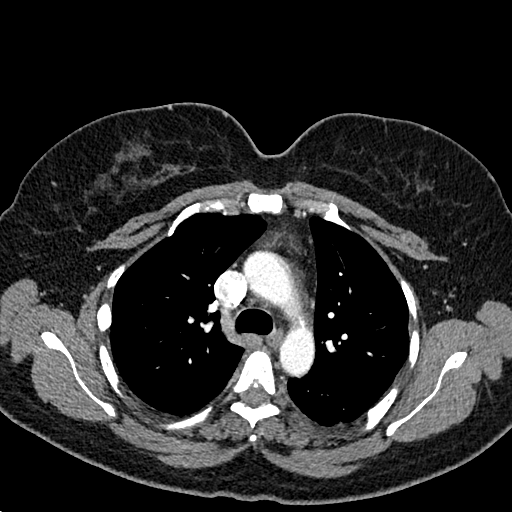
[im 91/132  lung]
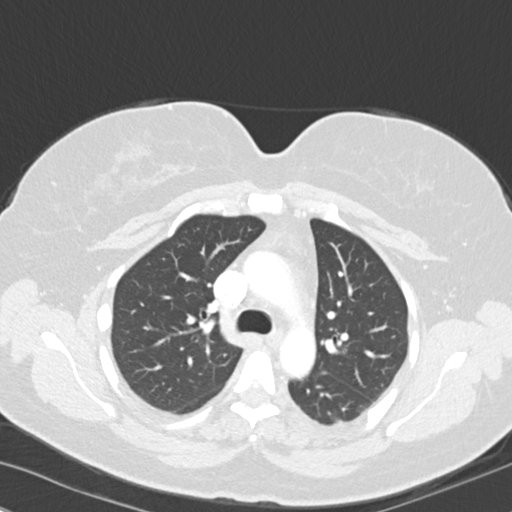
[im 101/132  lung]
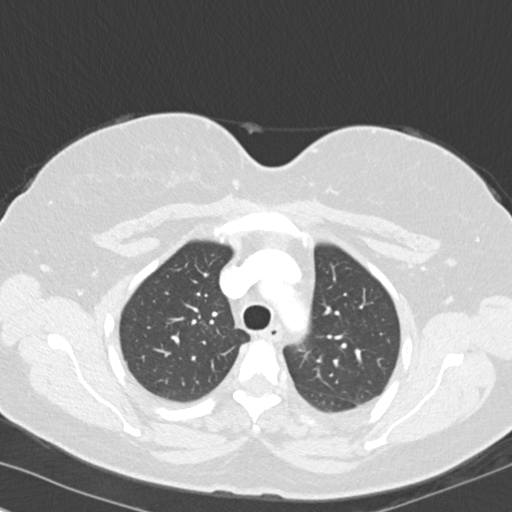
[im 111/132  lung]
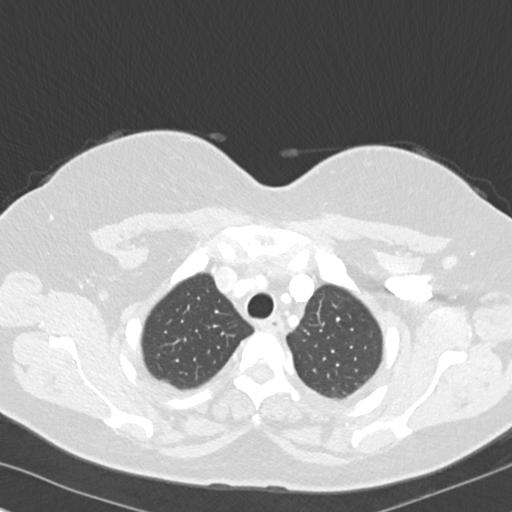
[im 121/132  lung]
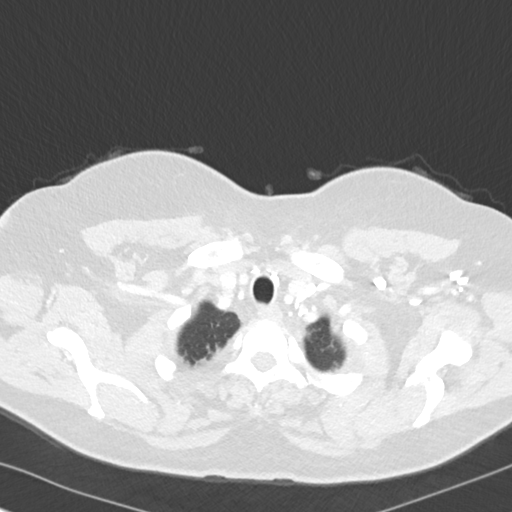

[Series 5: coronal · coronal · 0.66mm/px · 3 of 108 slices shown]
[im 22/108  lung]
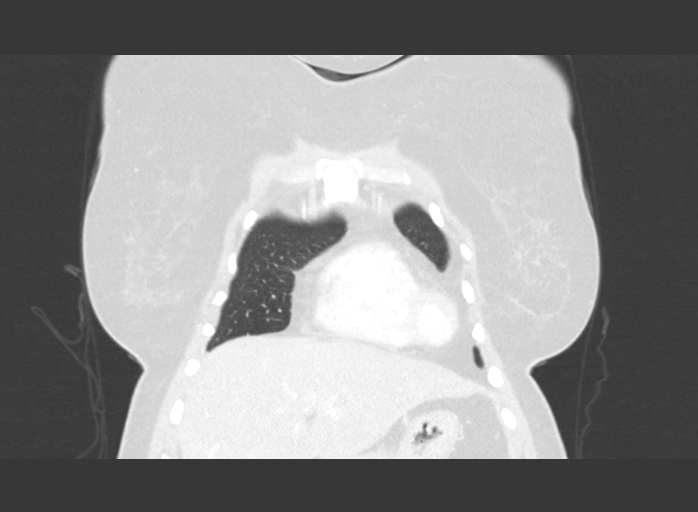
[im 43/108  lung]
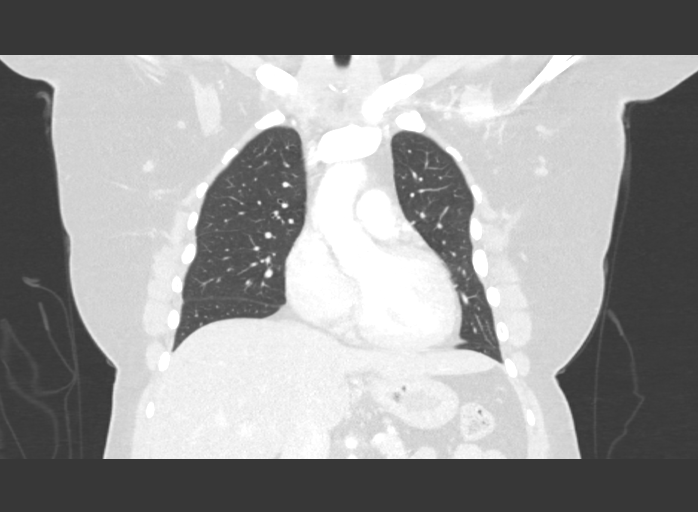
[im 65/108  lung]
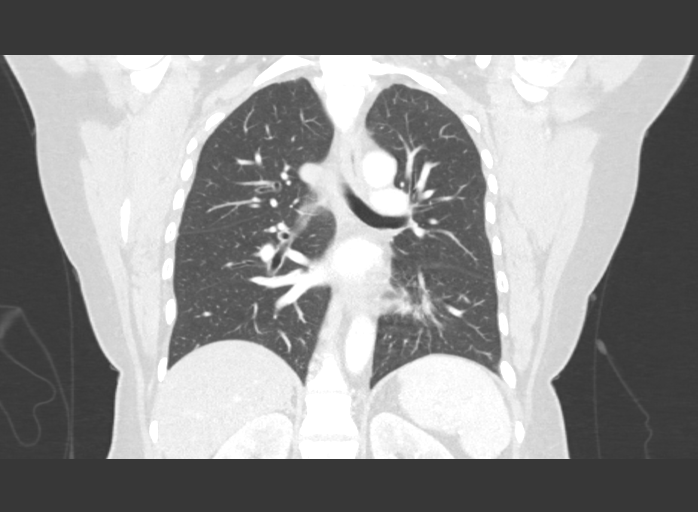

[15 of 36 positions shown; findings below may reference images not displayed]

FINDINGS: Cardiovascular: No significant vascular findings. Normal heart size.
No pericardial effusion.

Mediastinum/Nodes: No enlarged mediastinal, hilar, or axillary lymph
nodes. Thymic remnant or rebound hyperplasia in the anterior
mediastinum. Status post lower ectomy. Trachea, and esophagus
demonstrate no significant findings.

Lungs/Pleura: Lungs are clear. No pleural effusion or pneumothorax.

Upper Abdomen: No acute abnormality.  Hepatic steatosis.

Musculoskeletal: Biopsy marking clip in the central right breast.
Diffusely sclerotic osseous metastatic disease, not significantly
changed compared to prior.
IMPRESSION: 1. Diffusely sclerotic osseous metastatic disease, not significantly
changed compared to prior.
2. No evidence of lymphadenopathy or new metastatic disease in the
chest.
3. Hepatic steatosis.

## 2020-05-21 MED ORDER — IOHEXOL 300 MG/ML  SOLN
75.0000 mL | Freq: Once | INTRAMUSCULAR | Status: AC | PRN
  Administered 2020-05-21: 75 mL via INTRAVENOUS

## 2020-05-21 MED ORDER — TECHNETIUM TC 99M MEDRONATE IV KIT
21.8000 | PACK | Freq: Once | INTRAVENOUS | Status: AC
  Administered 2020-05-21: 21.8 via INTRAVENOUS

## 2020-06-04 NOTE — Progress Notes (Signed)
[place]  [phone] [phone]     ID: [name] [name] DOB: 1980/12/03  MR#: [id number]  HFS#:[id number]  Patient Care Team: Patient, No Pcp Per as PCP - General (General Practice) Derwood Kaplan, MD as PCP - Hematology/Oncology (Oncology) Magrinat, Virgie Dad, MD as Consulting Physician (Oncology) Jacelyn Pi, MD as Referring Physician (Endocrinology) Armandina Gemma, MD as Consulting Physician (General Surgery) Servando Salina, MD as Consulting Physician (Obstetrics and Gynecology) Chauncey Cruel, MD OTHER MD:  CHIEF COMPLAINT: metastatic breast cancer, estrogen receptor positive  CURRENT TREATMENT: letrozole, palbociclib, goserelin, denosumab   INTERVAL HISTORY: [name] returns today for follow up of her metastatic breast cancer.  Since her last visit, she underwent restaging studies on 05/21/2020 consisting of chest CT and bone scan. Chest CT showed: diffusely sclerotic osseous metastatic disease, not significantly changed compared to prior (01/2020); no evidence of lymphadenopathy or new metastatic disease.  Bone scan showed: homogeneous osseous uptake, consistent with treated widespread osseous metastatic disease based on previous imaging.  She takes letrozole daily. She has some arthralgias and myalgias related to this, particularly involving her ankles after standing from a prolonged sitting position. She takes the palbociclib currently at 100 mg daily, 21 days on 7 days off. She is not aware of any side effects from this medication except that she gets what sounds like very mild ulcers or at least irritation of her tongue.  She receives denosumab/Xgeva every 4 weeks. She was initially hypocalcemic but now she takes calcium supplementation and has had no further problems with that.  She receives goserelin/Zoladex also every 4 weeks. Of course she has hot flashes and some vaginal dryness related to this.  She is scheduled for  annual bilateral diagnostic mammography with right breast ultrasonography on 06/12/2020.   REVIEW OF SYSTEMS: [name] had to work around Thanksgiving and [place] but did have Christmas often she spent that with her children.  She is not exercising as regularly as I would like but she just joined "burn" and is looking forward to that.  She had her eyebrows tattooed and that went well.  She tells me she is "beginning to feel normal".  A detailed review of systems today was otherwise stable   COVID 19 VACCINATION STATUS: Status post Moderna x3, third dose October 2021; booster pending   HISTORY OF CURRENT ILLNESS: From the [place] note dated 04/10/2020 by Dickey Gave, PA-C:   "[name] [name] is a 40 y.o. female African American female with stage IV (T3 N1 M1) hormone receptor positive right breast cancer diagnosed in December 2020. We began seeing in November 2020 for leukocytosis, anemia and thrombocytopenia.  She had bruising and nose bleeds, as well as a 10-15 lb weight loss.  She had been having chronic back pain for several months, for which she was using ibuprofen or Aleve.  LDH was markedly elevated at 2599, reticulocyte count mildly elevated at 3.4% and haptoglobin was less than 10, which was consistent with hemolysis, but [place] was negative.  CT chest, abdomen and pelvis revealed diffusely sclerotic osseous structures with multiple lytic lesions noted throughout the axial skeleton.  Some lesions demonstrated expansile soft tissue components, and these findings are highly concerning for multiple myeloma or other osseous metastatic disease of uncertain origin.  A spiculated appearing mass or tissue element was also seen in the lateral right breast, 9 o'clock.  Bone marrow biopsy revealed abundant metastatic carcinoma, which was estrogen receptor positive, so consistent with breast  cancer origin.  With that information, it became clear that she had malignant breast cancer  that had metastasized to the bone.  She underwent a digital diagnostic bilateral mammogram with right breast ultrasound in December, which revealed highly suspicious calcifications spanning at least 9.2 cm in the upper outer quadrant of the right breast with distortion associated with the posterior calcifications.  A discrete mass was seen in the right breast with ultrasound at 10 o'clock, 8 cm from the nipple measuring 9 x 6 x 7 mm.  Several mildly abnormal nodes are seen in the right axilla with cortices measuring between 3 and 5 mm.  The lymph nodes were symmetric on mammography and were not definitely different compared to 2015.  Stereotactic needle core biopsy of the right breast and right axilla confirmed grade 2, invasive ductal carcinoma, as well as ductal carcinoma in situ.  The invasive component measured 0.9 cm in greatest linear extent on the core biopsy.  Metastatic carcinoma was involved in one lymph node.  Estrogen receptor positive at 90%, progesterone receptor positive at 1%, and HER2 negative.  Ki67 was 15%.  Nuclear medicine PET scan revealed widespread hypermetabolic bony metastatic disease.  Low level hypermetabolism in bilateral axillary nodes, right greater than left was seen with uptake identified in the ill defined soft tissue lesion in the lateral right breast.  Foci of hypermetabolism identified in the central uterus along the IUD and along the anterior cervix and adjacent small bowel, which were nonspecific.  MRI head did not reveal intracranial metastasis, but diffusely abnormal bone marrow in the skull and cervical spine likely due to metastatic disease was observed.  CA 27-29 was not elevated.   Due to her age of diagnosis, she underwent testing for hereditary breast cancer with the [employer]  myRisk hereditary cancer panel test.  This did not reveal any clinically significant mutation.  There were variants of uncertain significance of the ATM, AXIN2 and MSH3 genes.  She had severer  pain, so was placed on methadone, in addition to oxycodone and the doses were titrated up.  We recommended letrozole/palbociclib, denosumab monthly for the bone metastasis and goserelin monthly to suppress ovarian function due to being premenopausal.  She started letrozole on December 28th and denosumab and goserelin on January 7th.  She started palbociclib for 3 weeks on and one week off on January 9th.   She develops severe hypocalcemia, which required IV calcium replacement. Denosumab had to be held due to the severe hypocalcemia, but was resumed in March.  She was placed on high doses of oral calcium.  She had multiple other issues, such as decreased appetite, nausea and vomiting, as well  As dehydration, managed with medications. MRI thoracic and lumbar spine on January 19th revealed diffusely decreased T1 bone marrow signal with fairly homogeneous enhancement throughout the thoracolumbar spine.  Prior PET-CT demonstrated homogeneous hypermetabolic activity throughout the thoracolumbar spine.  These findings are favored to be related to the patient's anemia rather than diffuse metastatic disease.  There were possible small osseous metastases, notably within the T3 spinous process, left L2 pedicle and upper right sacrum.  There was no significant disc herniation, spinal stenosis or nerve root encroachment.  She was subsequently weaned off methadone and has not had recurrent pain.  She developed cytopenias due to palbociclib and we occasionally had to delay cycles  CT chest, abdomen and pelvis in May revealed diffuse patchy confluent sclerotic osseous metastatic disease throughout the axial and proximal appendicular skeleton, probably representing treatment effect.  ThereWere healed deformities throughout the bilateral ribs.  A new mild right axillary lymphadenopathy was also observed, which wasnonspecific.  No additional sites of metastatic disease were seen in the chest, abdomen or pelvis.  She had a  tubal  ligation and removal of her Mirena IUD in June.  CT chest, abdomen and pelvis in September revealed again widespread osseous sclerosis, indicative of treated metastatic disease.  The previous borderline enlarged right axillary lymph nodes had resolved.  She had recurrent cytopenias, and since we had to delay multiple cycles of palbociclib, we decreased her palbociclib dose to 100 mg daily for 3 weeks on a week off."  The patient's subsequent history is as detailed below.   PAST MEDICAL HISTORY: Past Medical History:  Diagnosis Date  . Anemia   . History of cardiac murmur as a child   . History of Graves' disease    dx hyperthyroidism during 4th pregnancy;   11-15-2013  s/p  total thyroidectomy  . 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 Pre-diabetes   . Stage IV breast cancer in female [facility]) 05/2019   oncologist-- dr c. Hinton Rao ([facility]) bone marrow bx 05-08-2019 and right breast bx 05-17-2020--- primary breast w/ mets to bones, pt taking oral chemo (ibrance)  Tumor associated hemolysis at presentation December 2020  PAST SURGICAL HISTORY: Past Surgical History:  Procedure Laterality Date  . IUD REMOVAL N/A 11/22/2019   Procedure: INTRAUTERINE DEVICE (IUD) REMOVAL;  Surgeon: Servando Salina, MD;  Location: [facility];  Service: Gynecology;  Laterality: N/A;  . LAPAROSCOPIC TUBAL LIGATION Bilateral 11/22/2019   Procedure: LAPAROSCOPIC TUBAL LIGATION By Cautery;  Surgeon: Servando Salina, MD;  Location: [facility];  Service: Gynecology;  Laterality: Bilateral;  . THYROIDECTOMY Bilateral 11/15/2013   Procedure: TOTAL THYROIDECTOMY;  Surgeon: Earnstine Regal, MD;  Location: [facility];  Service: General;  Laterality: Bilateral;  . WISDOM TOOTH EXTRACTION  2012    FAMILY HISTORY: Family History  Problem Relation Age of Onset  . Cancer Mother        Peripheral T cell Lymphoma  . Diabetes Father   . Diabetes  Maternal Grandmother   The patient's mother died at age 73 shortly after her diagnosis of T-cell non-Hodgkin's lymphoma. The patient's father is 67 years old as of December 2020. He has a history of diabetes, is a bilateral amputee, and has a history of EtOH abuse. The patient has 2 half siblings on her mother side and 3/2 siblings on her father's side. None of them have a history of cancer. She has a maternal cousin with a history of B-cell non-Hodgkin's lymphoma and a maternal grandfather with prostate cancer   GYNECOLOGIC HISTORY:  Patient's last menstrual period was 10/26/2018. Menarche: 40 years old Age at first live birth: 40 years old [place] P 4 LMP December 2020 (when goserelin started Contraceptive: Status post bilateral tubal ligation HRT n/a  Hysterectomy? no BSO? no   SOCIAL HISTORY: (updated 04/2020)  [name] is divorced. Her former husband Jamica Woodyard is a Armed forces technical officer for the [employer]. 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 Her daughters were called [name], [place], [place], and [place], age 49-9 as of December 2021. They stay 1 week with the patient in 1 week with her ex-husband. The patient also has a burn doodle called Karlene Lineman. The patient is a Psychologist, forensic    ADVANCED DIRECTIVES: Not in place. At the 05/06/2020 visit the patient was given the appropriate documents to complete and  notarized at her discretion.   HEALTH MAINTENANCE: Social History   Tobacco Use  . Smoking status: Never Smoker  . Smokeless tobacco: Never Used  Vaping Use  . Vaping Use: Never used  Substance Use Topics  . Alcohol use: Not Currently    Comment: social  . Drug use: Never     Colonoscopy: n/a (age)  PAP: Up-to-date  Bone density: Remote   No Known Allergies  Current Outpatient Medications  Medication Sig Dispense Refill  . b complex vitamins capsule Take 1 capsule by mouth daily.    . Calcium Carb-Cholecalciferol (CALCIUM 600 + D PO) Take 4 tablets by mouth daily.    .  Cholecalciferol (VITAMIN D3) 25 MCG (1000 UT) CAPS Take 3 capsules by mouth daily.    . Denosumab (XGEVA [place]) Inject into the skin every 30 (thirty) days.    . Goserelin Acetate (ZOLADEX [place]) Inject into the skin every 30 (thirty) days.    Leslee Home 100 MG tablet Take 1 tablet (100 mg total) by mouth as directed. 21 tablet 6  . ibuprofen (ADVIL) 800 MG tablet Take 1 tablet (800 mg total) by mouth every 8 (eight) hours as needed for moderate pain. 30 tablet 5  . letrozole (FEMARA) 2.5 MG tablet Take 1 tablet (2.5 mg total) by mouth daily. 90 tablet 4  . levothyroxine (SYNTHROID) 125 MCG tablet Take 125 mcg by mouth every morning.     . valACYclovir (VALTREX) 500 MG tablet Take 1 tablet (500 mg total) by mouth daily. 90 tablet 4   No current facility-administered medications for this visit.    OBJECTIVE: African-American woman in no acute distress  Vitals:   06/05/20 0839  BP: 127/74  Pulse: 70  Resp: 20  Temp: (!) 97 F (36.1 C)  SpO2: 100%     Body mass index is 33.25 kg/m.   Wt Readings from Last 3 Encounters:  06/05/20 199 lb 12.8 oz (90.6 kg)  05/06/20 203 lb 4.8 oz (92.2 kg)  04/08/20 196 lb 3.2 oz (89 kg)      ECOG FS:1 - Symptomatic but completely ambulatory  Sclerae unicteric, EOMs intact Wearing a mask No cervical or supraclavicular adenopathy Lungs no rales or rhonchi Heart regular rate and rhythm Abd soft, nontender, positive bowel sounds MSK no focal spinal tenderness, no upper extremity lymphedema Neuro: nonfocal, well oriented, appropriate affect Breasts: I do not palpate a mass in the right breast.  Both axillae are benign.   LAB RESULTS:  CMP     Component Value Date/Time   NA 141 05/06/2020 1532   NA 140 04/16/2020 0000   K 4.0 05/06/2020 1532   CL 106 05/06/2020 1532   CO2 27 05/06/2020 1532   GLUCOSE 85 05/06/2020 1532   BUN 13 05/06/2020 1532   BUN 18 04/16/2020 0000   CREATININE 0.96 05/06/2020 1532   CALCIUM 9.4 05/06/2020 1532   PROT 7.3  05/06/2020 1532   ALBUMIN 3.7 05/06/2020 1532   AST 19 05/06/2020 1532   ALT 19 05/06/2020 1532   ALKPHOS 87 05/06/2020 1532   BILITOT 0.3 05/06/2020 1532   GFRNONAA >60 05/06/2020 1532   GFRAA >60 04/28/2019 1218    No results found for: TOTALPROTELP, ALBUMINELP, A1GS, A2GS, BETS, BETA2SER, GAMS, MSPIKE, SPEI  Lab Results  Component Value Date   WBC 3.0 (L) 06/05/2020   NEUTROABS PENDING 06/05/2020   HGB 11.2 (L) 06/05/2020   HCT 35.5 (L) 06/05/2020   MCV 93.4 06/05/2020   PLT 277  06/05/2020    No results found for: LABCA2  No components found for: [id number]  No results for input(s): INR in the last 168 hours.  No results found for: LABCA2  No results found for: HRC163  No results found for: AGT364  No results found for: WOE321  Lab Results  Component Value Date   CA2729 10.7 05/06/2020    No components found for: HGQUANT  No results found for: CEA1 / No results found for: CEA1   No results found for: AFPTUMOR  No results found for: CHROMOGRNA  No results found for: KPAFRELGTCHN, LAMBDASER, KAPLAMBRATIO (kappa/lambda light chains)  No results found for: HGBA, HGBA2QUANT, HGBFQUANT, HGBSQUAN (Hemoglobinopathy evaluation)   Lab Results  Component Value Date   LDH 317 (H) 05/06/2020    No results found for: IRON, TIBC, IRONPCTSAT (Iron and TIBC)  Lab Results  Component Value Date   FERRITIN 99 05/06/2020    Urinalysis    Component Value Date/Time   COLORURINE YELLOW 04/28/2019 1223   APPEARANCEUR HAZY (A) 04/28/2019 1223   LABSPEC 1.024 04/28/2019 1223   PHURINE 5.0 04/28/2019 [place] 04/28/2019 [place] 04/28/2019 [place] 04/28/2019 [place] 04/28/2019 [place] 04/28/2019 1223   NITRITE NEGATIVE 04/28/2019 1223   LEUKOCYTESUR NEGATIVE 04/28/2019 1223     STUDIES: CT Chest W Contrast  Result Date: 05/21/2020 CLINICAL DATA:  Metastatic lung cancer  restaging EXAM: CT CHEST WITH CONTRAST TECHNIQUE: Multidetector CT imaging of the chest was performed during intravenous contrast administration. CONTRAST:  105m OMNIPAQUE IOHEXOL 300 MG/ML  SOLN COMPARISON:  CT chest, 02/13/2020, 10/24/2019, PET-CT, 05/21/2019 FINDINGS: Cardiovascular: No significant vascular findings. Normal heart size. No pericardial effusion. Mediastinum/Nodes: No enlarged mediastinal, hilar, or axillary lymph nodes. Thymic remnant or rebound hyperplasia in the anterior mediastinum. Status post lower ectomy. Trachea, and esophagus demonstrate no significant findings. Lungs/Pleura: Lungs are clear. No pleural effusion or pneumothorax. Upper Abdomen: No acute abnormality.  Hepatic steatosis. Musculoskeletal: Biopsy marking clip in the central right breast. Diffusely sclerotic osseous metastatic disease, not significantly changed compared to prior. IMPRESSION: 1. Diffusely sclerotic osseous metastatic disease, not significantly changed compared to prior. 2. No evidence of lymphadenopathy or new metastatic disease in the chest. 3. Hepatic steatosis. Electronically Signed   By: AEddie CandleM.D.   On: 05/21/2020 11:29   NM Bone Scan Whole Body  Result Date: 05/21/2020 CLINICAL DATA:  Metastatic breast cancer. Evaluate status of osseous metastatic disease. EXAM: NUCLEAR MEDICINE WHOLE BODY BONE SCAN TECHNIQUE: Whole body anterior and posterior images were obtained approximately 3 hours after intravenous injection of radiopharmaceutical. RADIOPHARMACEUTICALS:  21.8 mCi Technetium-984mDP IV COMPARISON:  PET-CT 05/21/2019, thoracolumbar MRI 06/19/2019 and chest CT 05/21/2020. FINDINGS: The osseous uptake is homogeneous without focal areas of increased or decreased uptake. In correlation with previous imaging demonstrating diffuse osseous sclerosis, these findings are most consistent with treated metastatic disease. The soft tissue activity is within normal limits. IMPRESSION: Homogeneous osseous  uptake, consistent with treated widespread osseous metastatic disease based on previous imaging. Electronically Signed   By: WiRichardean Sale.D.   On: 05/21/2020 16:55     ELIGIBLE FOR AVAILABLE RESEARCH PROTOCOL: no  ASSESSMENT: 89.o. [place] woman presenting NOV 2020 with stage IV breast cancer as follows:  (a) workup of initial pancytopenia and hemolysis showed a leukoerythroblastic blood picture with bone marrow biopsy 05/07/2020 confirming metastatic carcinoma, estrogen receptor positive; cytogenetics were normal  (b) CT  scans at baseline showing multiple bone lesions (both sclerotic and lytic) and a possible mass in the right breast; no definitive lung or liver involvement  (c) right breast upper outer quadrant biopsy x2 and right axillary lymph node biopsy on 05/18/2019 confirmed a clinical 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, grade 1-2, estrogen receptor strongly positive, progesterone receptor moderately positive (1%), HER-2 negative, with an MIB-1 of 2-15%  (d) CA 27-29 was not informative (repeat 05/06/2020 WNL)  (1) letrozole started 05/28/2019  (a) goserelin started 06/07/2019, repeated every 4 weeks  (b) palbociclib added 06/09/2019  (c) palbociclib dose decreased to 100 mg daily, 21/7, with September 2021 cycle  (2) denosumab/Xgeva started 06/07/2019, repeated every 4 weeks  (3) genetics testing through the [facility] MyRisk panel dated 09/22/2019 found no deleterious mutations in the genes tested which included BRCA 1 and 2, CHEK2, PALB 2 and TP53 among others.  (a) variants of uncertain significance were found in ATM, AXN 2, and [place] 3.  (4) restaging studies:  (a) chest CT and bone scan 05/21/2020 shows no visceral disease, stable bone lesions   PLAN: [name] is now a little over a year out from definitive diagnosis of metastatic disease.  We just obtain restaging studies which shows her disease remains bone only, with no visceral involvement, and the bone lesions  appear stable.  She continues on goserelin, which she tolerates well, as well as letrozole.  She is currently on palbociclib at 100 mg daily 21/7.  Today is the first day of her off week and her ANC is 0.8.  She is not symptomatic from this and the question is whether we should drop the dose or not.  I think what I would like to do is repeat this in a month from now, and if the reading on 0203 is equally low we will drop the dose to 75 mg daily  I reviewed her genetics results, which were obtained by Dr. Hinton Rao, and we are scanning the full report separately.  She had her eyebrows tattooed and they are very comely.  I suggest that she get her teeth cleaned and looked at.  Because of her low counts I wrote her for 2 g of amoxicillin to take on the day of her dental visit  The plan at this point is to continue treatments every 4 weeks and she will see me again in 12 weeks, but of course I will be glad to see her before then if necessary.  I would plan on repeating a bone scan in about 6 months.  She is also up for mammography next week.  Total encounter time 40 minutes.Sarajane Jews C. Magrinat, MD 06/05/2020 8:57 AM Medical Oncology and Hematology [facility] [place], [place] [address] Tel. [phone]    Fax. [phone]   This document serves as a record of services personally performed by Lurline Del, MD. It was created on his behalf by Wilburn Mylar, a trained medical scribe. The creation of this record is based on the scribe's personal observations and the provider's statements to them.   I, Lurline Del MD, have reviewed the above documentation for accuracy and completeness, and I agree with the above.   *Total Encounter Time as defined by the Centers for Medicare and Medicaid Services includes, in addition to the face-to-face time of a patient visit (documented in the note above) non-face-to-face time: obtaining and reviewing outside history,  ordering and reviewing medications, tests or procedures, care coordination (  communications with other health care professionals or caregivers) and documentation in the medical record.

## 2020-06-05 ENCOUNTER — Inpatient Hospital Stay (HOSPITAL_BASED_OUTPATIENT_CLINIC_OR_DEPARTMENT_OTHER): Payer: Commercial Managed Care - PPO | Admitting: Oncology

## 2020-06-05 ENCOUNTER — Other Ambulatory Visit: Payer: Self-pay

## 2020-06-05 ENCOUNTER — Inpatient Hospital Stay: Payer: Commercial Managed Care - PPO

## 2020-06-05 ENCOUNTER — Inpatient Hospital Stay: Payer: Commercial Managed Care - PPO | Attending: Oncology

## 2020-06-05 VITALS — BP 127/74 | HR 70 | Temp 97.0°F | Resp 20 | Ht 65.0 in | Wt 199.8 lb

## 2020-06-05 DIAGNOSIS — D582 Other hemoglobinopathies: Secondary | ICD-10-CM

## 2020-06-05 DIAGNOSIS — C50111 Malignant neoplasm of central portion of right female breast: Secondary | ICD-10-CM | POA: Insufficient documentation

## 2020-06-05 DIAGNOSIS — Z791 Long term (current) use of non-steroidal anti-inflammatories (NSAID): Secondary | ICD-10-CM | POA: Diagnosis not present

## 2020-06-05 DIAGNOSIS — C50811 Malignant neoplasm of overlapping sites of right female breast: Secondary | ICD-10-CM | POA: Diagnosis not present

## 2020-06-05 DIAGNOSIS — Z170 Estrogen receptor positive status [ER+]: Secondary | ICD-10-CM

## 2020-06-05 DIAGNOSIS — M791 Myalgia, unspecified site: Secondary | ICD-10-CM | POA: Diagnosis not present

## 2020-06-05 DIAGNOSIS — Z79811 Long term (current) use of aromatase inhibitors: Secondary | ICD-10-CM | POA: Insufficient documentation

## 2020-06-05 DIAGNOSIS — C50411 Malignant neoplasm of upper-outer quadrant of right female breast: Secondary | ICD-10-CM

## 2020-06-05 DIAGNOSIS — C7951 Secondary malignant neoplasm of bone: Secondary | ICD-10-CM | POA: Diagnosis not present

## 2020-06-05 DIAGNOSIS — D5919 Other autoimmune hemolytic anemia: Secondary | ICD-10-CM

## 2020-06-05 DIAGNOSIS — Z807 Family history of other malignant neoplasms of lymphoid, hematopoietic and related tissues: Secondary | ICD-10-CM | POA: Insufficient documentation

## 2020-06-05 DIAGNOSIS — E0500 Thyrotoxicosis with diffuse goiter without thyrotoxic crisis or storm: Secondary | ICD-10-CM

## 2020-06-05 DIAGNOSIS — Z79899 Other long term (current) drug therapy: Secondary | ICD-10-CM | POA: Diagnosis not present

## 2020-06-05 DIAGNOSIS — M2550 Pain in unspecified joint: Secondary | ICD-10-CM | POA: Insufficient documentation

## 2020-06-05 DIAGNOSIS — K760 Fatty (change of) liver, not elsewhere classified: Secondary | ICD-10-CM | POA: Diagnosis not present

## 2020-06-05 DIAGNOSIS — R232 Flushing: Secondary | ICD-10-CM | POA: Diagnosis not present

## 2020-06-05 DIAGNOSIS — E890 Postprocedural hypothyroidism: Secondary | ICD-10-CM | POA: Insufficient documentation

## 2020-06-05 DIAGNOSIS — C7952 Secondary malignant neoplasm of bone marrow: Secondary | ICD-10-CM

## 2020-06-05 LAB — CBC WITH DIFFERENTIAL/PLATELET
Abs Immature Granulocytes: 0 10*3/uL (ref 0.00–0.07)
Basophils Absolute: 0.1 10*3/uL (ref 0.0–0.1)
Basophils Relative: 2 %
Eosinophils Absolute: 0.1 10*3/uL (ref 0.0–0.5)
Eosinophils Relative: 3 %
HCT: 35.5 % — ABNORMAL LOW (ref 36.0–46.0)
Hemoglobin: 11.2 g/dL — ABNORMAL LOW (ref 12.0–15.0)
Immature Granulocytes: 0 %
Lymphocytes Relative: 60 %
Lymphs Abs: 1.8 10*3/uL (ref 0.7–4.0)
MCH: 29.5 pg (ref 26.0–34.0)
MCHC: 31.5 g/dL (ref 30.0–36.0)
MCV: 93.4 fL (ref 80.0–100.0)
Monocytes Absolute: 0.2 10*3/uL (ref 0.1–1.0)
Monocytes Relative: 8 %
Neutro Abs: 0.8 10*3/uL — ABNORMAL LOW (ref 1.7–7.7)
Neutrophils Relative %: 27 %
Platelets: 277 10*3/uL (ref 150–400)
RBC: 3.8 MIL/uL — ABNORMAL LOW (ref 3.87–5.11)
RDW: 13.8 % (ref 11.5–15.5)
WBC: 3 10*3/uL — ABNORMAL LOW (ref 4.0–10.5)
nRBC: 0 % (ref 0.0–0.2)

## 2020-06-05 LAB — COMPREHENSIVE METABOLIC PANEL
ALT: 17 U/L (ref 0–44)
AST: 16 U/L (ref 15–41)
Albumin: 3.6 g/dL (ref 3.5–5.0)
Alkaline Phosphatase: 68 U/L (ref 38–126)
Anion gap: 6 (ref 5–15)
BUN: 13 mg/dL (ref 6–20)
CO2: 26 mmol/L (ref 22–32)
Calcium: 9.1 mg/dL (ref 8.9–10.3)
Chloride: 108 mmol/L (ref 98–111)
Creatinine, Ser: 0.78 mg/dL (ref 0.44–1.00)
GFR, Estimated: >60 mL/min (ref >60)
Glucose, Bld: 102 mg/dL — ABNORMAL HIGH (ref 70–99)
Potassium: 4 mmol/L (ref 3.5–5.1)
Sodium: 140 mmol/L (ref 135–145)
Total Bilirubin: 0.4 mg/dL (ref 0.3–1.2)
Total Protein: 7.3 g/dL (ref 6.5–8.1)

## 2020-06-05 MED ORDER — DENOSUMAB 120 MG/1.7ML ~~LOC~~ SOLN
120.0000 mg | Freq: Once | SUBCUTANEOUS | Status: AC
  Administered 2020-06-05: 120 mg via SUBCUTANEOUS

## 2020-06-05 MED ORDER — DENOSUMAB 120 MG/1.7ML ~~LOC~~ SOLN
SUBCUTANEOUS | Status: AC
  Filled 2020-06-05: qty 1.7

## 2020-06-05 MED ORDER — AMOXICILLIN 500 MG PO CAPS: 2000.0000 mg | ORAL_CAPSULE | Freq: Once | ORAL | 6 refills | Status: AC

## 2020-06-05 MED ORDER — GOSERELIN ACETATE 3.6 MG ~~LOC~~ IMPL
DRUG_IMPLANT | SUBCUTANEOUS | Status: AC
  Filled 2020-06-05: qty 3.6

## 2020-06-05 MED ORDER — GOSERELIN ACETATE 3.6 MG ~~LOC~~ IMPL
3.6000 mg | DRUG_IMPLANT | Freq: Once | SUBCUTANEOUS | Status: AC
  Administered 2020-06-05: 3.6 mg via SUBCUTANEOUS

## 2020-06-05 NOTE — Patient Instructions (Signed)
Goserelin injection What is this medicine? GOSERELIN (GOE se rel in) is similar to a hormone found in the body. It lowers the amount of sex hormones that the body makes. Men will have lower testosterone levels and women will have lower estrogen levels while taking this medicine. In men, this medicine is used to treat prostate cancer; the injection is either given once per month or once every 12 weeks. A once per month injection (only) is used to treat women with endometriosis, dysfunctional uterine bleeding, or advanced breast cancer. This medicine may be used for other purposes; ask your health care provider or pharmacist if you have questions. COMMON BRAND NAME(S): Zoladex What should I tell my health care provider before I take this medicine? They need to know if you have any of these conditions:  bone problems  diabetes  heart disease  history of irregular heartbeat  an unusual or allergic reaction to goserelin, other medicines, foods, dyes, or preservatives  pregnant or trying to get pregnant  breast-feeding How should I use this medicine? This medicine is for injection under the skin. It is given by a health care professional in a hospital or clinic setting. Talk to your pediatrician regarding the use of this medicine in children. Special care may be needed. Overdosage: If you think you have taken too much of this medicine contact a poison control center or emergency room at once. NOTE: This medicine is only for you. Do not share this medicine with others. What if I miss a dose? It is important not to miss your dose. Call your doctor or health care professional if you are unable to keep an appointment. What may interact with this medicine? Do not take this medicine with any of the following medications:  cisapride  dronedarone  pimozide  thioridazine This medicine may also interact with the following medications:  other medicines that prolong the QT interval (an abnormal  heart rhythm) This list may not describe all possible interactions. Give your health care provider a list of all the medicines, herbs, non-prescription drugs, or dietary supplements you use. Also tell them if you smoke, drink alcohol, or use illegal drugs. Some items may interact with your medicine. What should I watch for while using this medicine? Visit your doctor or health care provider for regular checks on your progress. Your symptoms may appear to get worse during the first weeks of this therapy. Tell your doctor or healthcare provider if your symptoms do not start to get better or if they get worse after this time. Your bones may get weaker if you take this medicine for a long time. If you smoke or frequently drink alcohol you may increase your risk of bone loss. A family history of osteoporosis, chronic use of drugs for seizures (convulsions), or corticosteroids can also increase your risk of bone loss. Talk to your doctor about how to keep your bones strong. This medicine should stop regular monthly menstruation in women. Tell your doctor if you continue to menstruate. Women should not become pregnant while taking this medicine or for 12 weeks after stopping this medicine. Women should inform their doctor if they wish to become pregnant or think they might be pregnant. There is a potential for serious side effects to an unborn child. Talk to your health care professional or pharmacist for more information. Do not breast-feed an infant while taking this medicine. Men should inform their doctors if they wish to father a child. This medicine may lower sperm counts. Talk  to your health care professional or pharmacist for more information. This medicine may increase blood sugar. Ask your healthcare provider if changes in diet or medicines are needed if you have diabetes. What side effects may I notice from receiving this medicine? Side effects that you should report to your doctor or health care  professional as soon as possible:  allergic reactions like skin rash, itching or hives, swelling of the face, lips, or tongue  bone pain  breathing problems  changes in vision  chest pain  feeling faint or lightheaded, falls  fever, chills  pain, swelling, warmth in the leg  pain, tingling, numbness in the hands or feet  signs and symptoms of high blood sugar such as being more thirsty or hungry or having to urinate more than normal. You may also feel very tired or have blurry vision  signs and symptoms of low blood pressure like dizziness; feeling faint or lightheaded, falls; unusually weak or tired  stomach pain  swelling of the ankles, feet, hands  trouble passing urine or change in the amount of urine  unusually high or low blood pressure  unusually weak or tired Side effects that usually do not require medical attention (report to your doctor or health care professional if they continue or are bothersome):  change in sex drive or performance  changes in breast size in both males and females  changes in emotions or moods  headache  hot flashes  irritation at site where injected  loss of appetite  skin problems like acne, dry skin  vaginal dryness This list may not describe all possible side effects. Call your doctor for medical advice about side effects. You may report side effects to FDA at 1-800-FDA-1088. Where should I keep my medicine? This drug is given in a hospital or clinic and will not be stored at home. NOTE: This sheet is a summary. It may not cover all possible information. If you have questions about this medicine, talk to your doctor, pharmacist, or health care provider.  2020 Elsevier/Gold Standard (2018-09-04 14:05:56) Denosumab injection What is this medicine? DENOSUMAB (den oh sue mab) slows bone breakdown. Prolia is used to treat osteoporosis in women after menopause and in men, and in people who are taking corticosteroids for 6  months or more. Xgeva is used to treat a high calcium level due to cancer and to prevent bone fractures and other bone problems caused by multiple myeloma or cancer bone metastases. Xgeva is also used to treat giant cell tumor of the bone. This medicine may be used for other purposes; ask your health care provider or pharmacist if you have questions. COMMON BRAND NAME(S): Prolia, XGEVA What should I tell my health care provider before I take this medicine? They need to know if you have any of these conditions:  dental disease  having surgery or tooth extraction  infection  kidney disease  low levels of calcium or Vitamin D in the blood  malnutrition  on hemodialysis  skin conditions or sensitivity  thyroid or parathyroid disease  an unusual reaction to denosumab, other medicines, foods, dyes, or preservatives  pregnant or trying to get pregnant  breast-feeding How should I use this medicine? This medicine is for injection under the skin. It is given by a health care professional in a hospital or clinic setting. A special MedGuide will be given to you before each treatment. Be sure to read this information carefully each time. For Prolia, talk to your pediatrician regarding the use   of this medicine in children. Special care may be needed. For Delton See, talk to your pediatrician regarding the use of this medicine in children. While this drug may be prescribed for children as young as 13 years for selected conditions, precautions do apply. Overdosage: If you think you have taken too much of this medicine contact a poison control center or emergency room at once. NOTE: This medicine is only for you. Do not share this medicine with others. What if I miss a dose? It is important not to miss your dose. Call your doctor or health care professional if you are unable to keep an appointment. What may interact with this medicine? Do not take this medicine with any of the following  medications:  other medicines containing denosumab This medicine may also interact with the following medications:  medicines that lower your chance of fighting infection  steroid medicines like prednisone or cortisone This list may not describe all possible interactions. Give your health care provider a list of all the medicines, herbs, non-prescription drugs, or dietary supplements you use. Also tell them if you smoke, drink alcohol, or use illegal drugs. Some items may interact with your medicine. What should I watch for while using this medicine? Visit your doctor or health care professional for regular checks on your progress. Your doctor or health care professional may order blood tests and other tests to see how you are doing. Call your doctor or health care professional for advice if you get a fever, chills or sore throat, or other symptoms of a cold or flu. Do not treat yourself. This drug may decrease your body's ability to fight infection. Try to avoid being around people who are sick. You should make sure you get enough calcium and vitamin D while you are taking this medicine, unless your doctor tells you not to. Discuss the foods you eat and the vitamins you take with your health care professional. See your dentist regularly. Brush and floss your teeth as directed. Before you have any dental work done, tell your dentist you are receiving this medicine. Do not become pregnant while taking this medicine or for 5 months after stopping it. Talk with your doctor or health care professional about your birth control options while taking this medicine. Women should inform their doctor if they wish to become pregnant or think they might be pregnant. There is a potential for serious side effects to an unborn child. Talk to your health care professional or pharmacist for more information. What side effects may I notice from receiving this medicine? Side effects that you should report to your doctor  or health care professional as soon as possible:  allergic reactions like skin rash, itching or hives, swelling of the face, lips, or tongue  bone pain  breathing problems  dizziness  jaw pain, especially after dental work  redness, blistering, peeling of the skin  signs and symptoms of infection like fever or chills; cough; sore throat; pain or trouble passing urine  signs of low calcium like fast heartbeat, muscle cramps or muscle pain; pain, tingling, numbness in the hands or feet; seizures  unusual bleeding or bruising  unusually weak or tired Side effects that usually do not require medical attention (report to your doctor or health care professional if they continue or are bothersome):  constipation  diarrhea  headache  joint pain  loss of appetite  muscle pain  runny nose  tiredness  upset stomach This list may not describe all possible side  effects. Call your doctor for medical advice about side effects. You may report side effects to FDA at 1-800-FDA-1088. Where should I keep my medicine? This medicine is only given in a clinic, doctor's office, or other health care setting and will not be stored at home. NOTE: This sheet is a summary. It may not cover all possible information. If you have questions about this medicine, talk to your doctor, pharmacist, or health care provider.  2020 Elsevier/Gold Standard (2017-09-23 16:10:44)

## 2020-06-06 ENCOUNTER — Other Ambulatory Visit: Payer: Self-pay | Admitting: *Deleted

## 2020-06-06 DIAGNOSIS — C50411 Malignant neoplasm of upper-outer quadrant of right female breast: Secondary | ICD-10-CM

## 2020-06-06 DIAGNOSIS — Z170 Estrogen receptor positive status [ER+]: Secondary | ICD-10-CM

## 2020-06-06 MED ORDER — IBRANCE 100 MG PO TABS: 100.0000 mg | ORAL_TABLET | ORAL | 6 refills | Status: DC

## 2020-06-09 ENCOUNTER — Telehealth: Payer: Self-pay | Admitting: Oncology

## 2020-06-09 NOTE — Telephone Encounter (Signed)
Scheduled appts per 1/6 los. Pt confirmed appt dates and times.  

## 2020-06-12 ENCOUNTER — Other Ambulatory Visit: Payer: Commercial Managed Care - PPO

## 2020-06-25 ENCOUNTER — Other Ambulatory Visit: Payer: Self-pay

## 2020-06-25 ENCOUNTER — Ambulatory Visit
Admission: RE | Admit: 2020-06-25 | Discharge: 2020-06-25 | Disposition: A | Discharge status: Home / Self Care | Payer: Commercial Managed Care - PPO | Source: Ambulatory Visit | Attending: Oncology | Admitting: Oncology

## 2020-06-25 ENCOUNTER — Other Ambulatory Visit: Payer: Self-pay | Admitting: Oncology

## 2020-06-25 DIAGNOSIS — C50411 Malignant neoplasm of upper-outer quadrant of right female breast: Secondary | ICD-10-CM

## 2020-06-25 DIAGNOSIS — C50811 Malignant neoplasm of overlapping sites of right female breast: Secondary | ICD-10-CM

## 2020-06-25 DIAGNOSIS — D5919 Other autoimmune hemolytic anemia: Secondary | ICD-10-CM

## 2020-06-25 DIAGNOSIS — Z170 Estrogen receptor positive status [ER+]: Secondary | ICD-10-CM

## 2020-06-25 DIAGNOSIS — E0500 Thyrotoxicosis with diffuse goiter without thyrotoxic crisis or storm: Secondary | ICD-10-CM

## 2020-06-25 DIAGNOSIS — C7951 Secondary malignant neoplasm of bone: Secondary | ICD-10-CM

## 2020-06-25 IMAGING — US US BREAST*R* LIMITED INC AXILLA
1 series · 12 of 12 positions shown · non-contrast
Comparison: Previous exam(s).

CLINICAL DATA: 39-year-old female with known right breast cancer
and diffuse metastatic disease. The patient is currently undergoing
oral chemotherapy and has not had surgery.

EXAM:
DIGITAL DIAGNOSTIC BILATERAL MAMMOGRAM WITH CAD AND TOMOSYNTHESIS
ULTRASOUND RIGHT BREAST
TECHNIQUE: Bilateral digital diagnostic mammography and breast tomosynthesis
was performed. Digital images of the breasts were evaluated with
computer-aided detection. Targeted ultrasound examination of the
Right breast was performed.

[Series 1: us breast*right* limited inc axilla · 0.07mm/px · 12 of 12 slices shown]
[im 1/12]
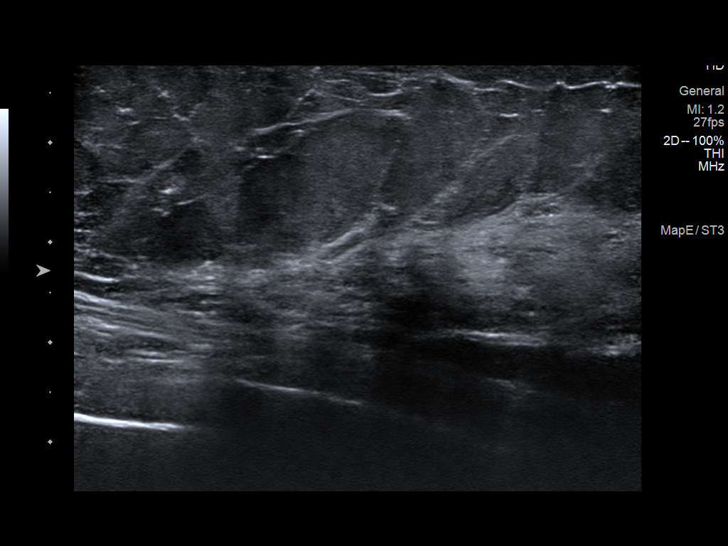
[im 2/12]
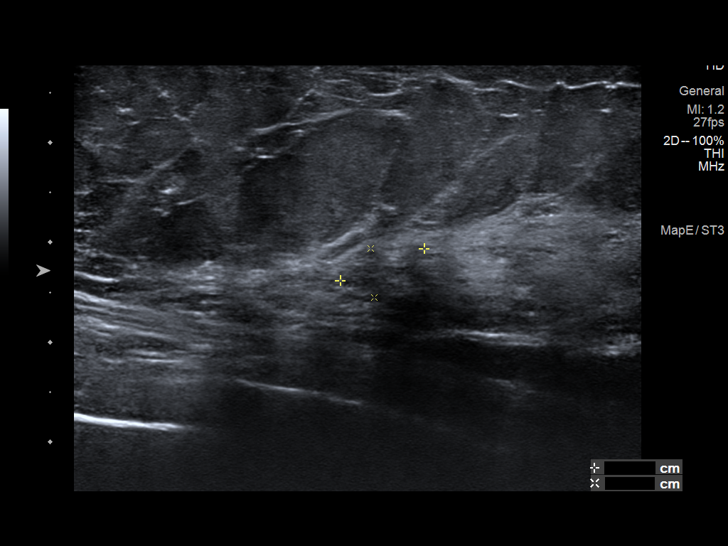
[im 3/12]
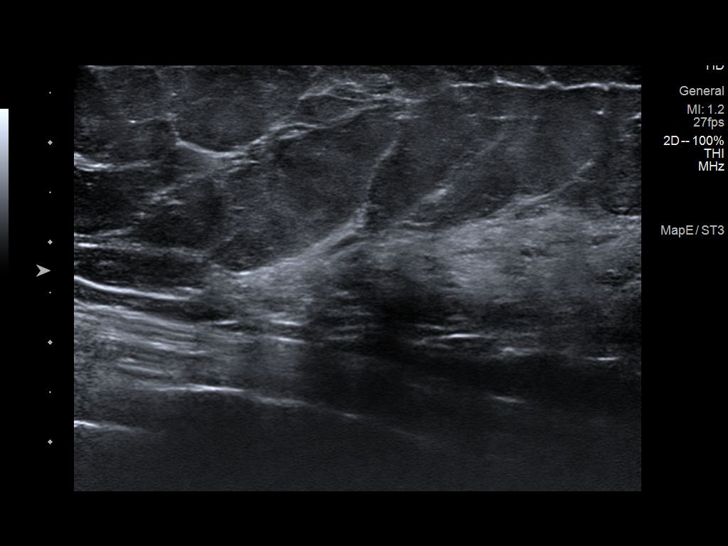
[im 4/12]
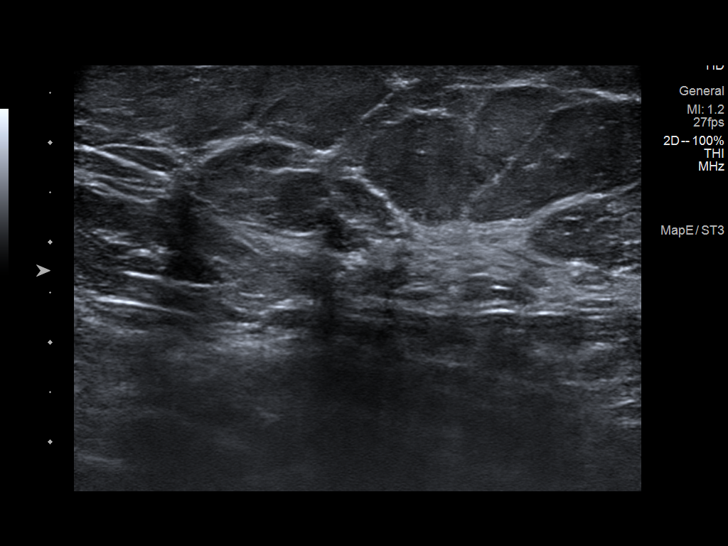
[im 5/12]
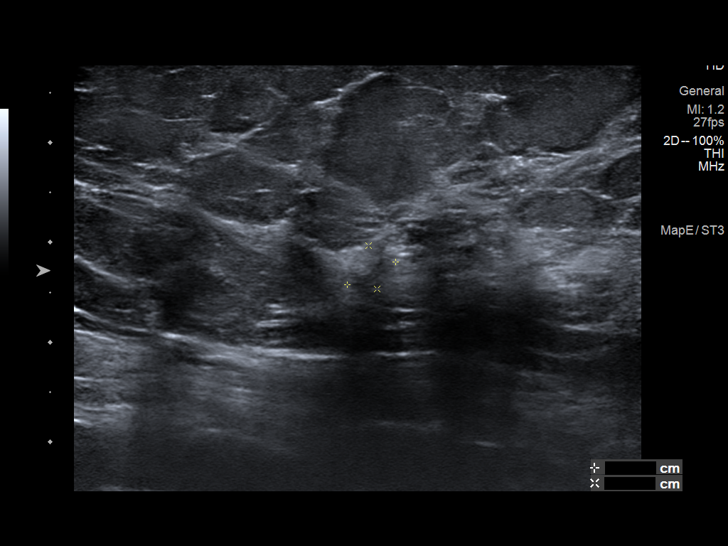
[im 6/12]
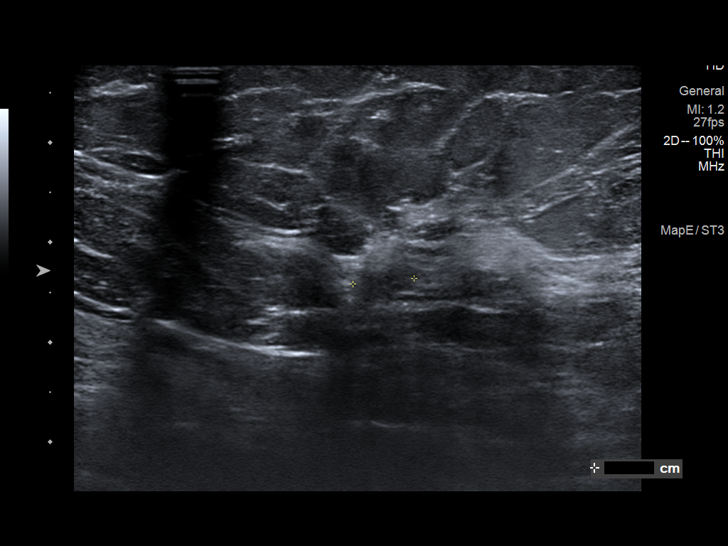
[im 7/12]
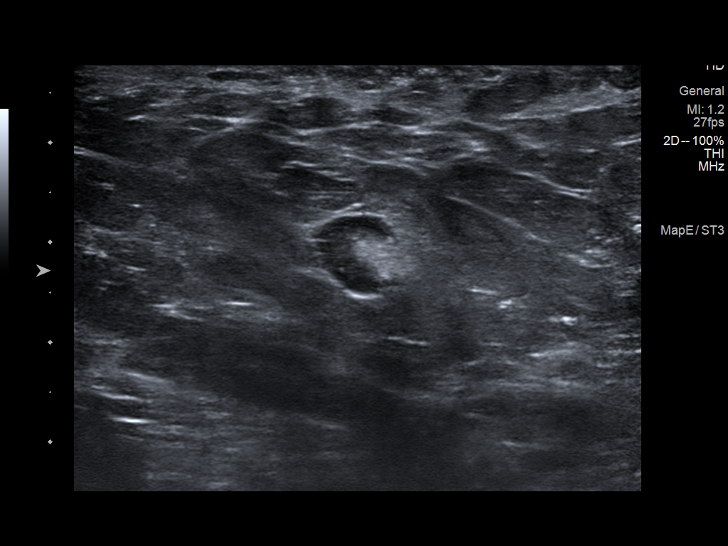
[im 8/12]
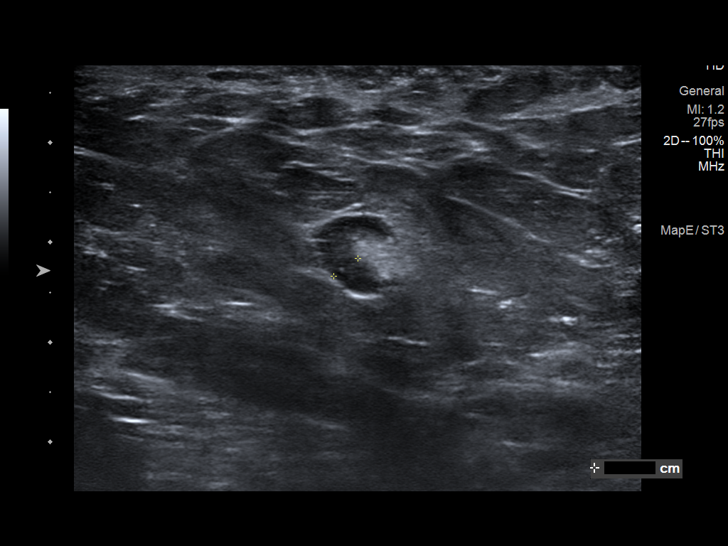
[im 9/12]
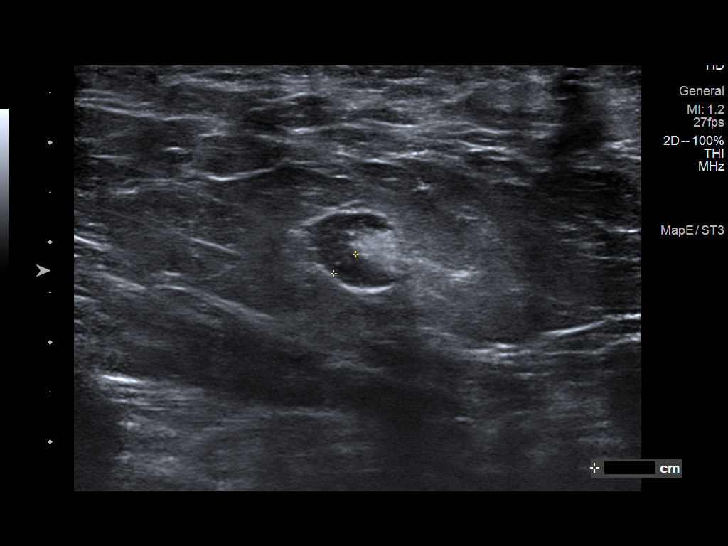
[im 10/12]
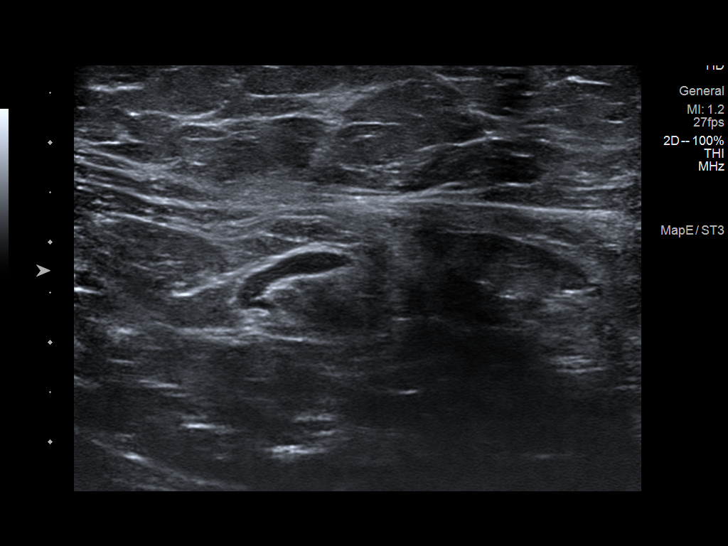
[im 11/12]
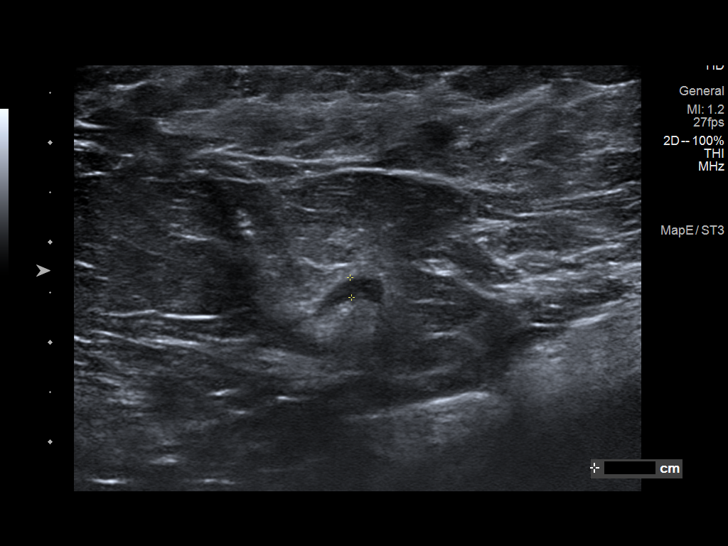
[im 12/12]
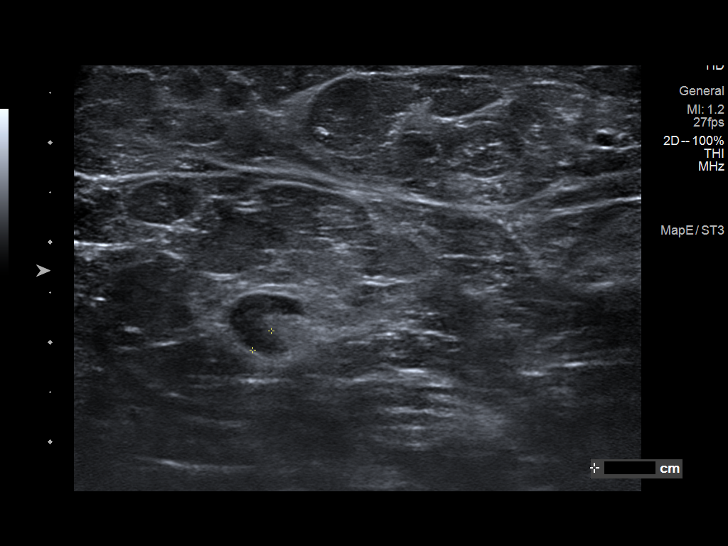

[12 of 12 positions shown; findings below may reference images not displayed]

ACR Breast Density Category c: The breast tissue is heterogeneously
dense, which may obscure small masses.
FINDINGS: Post biopsy changes noted in the upper-outer quadrant of the right
breast at anterior and posterior depth. There is been slight
interval progression of pleomorphic calcifications spanning between
the 2 biopsy sites. No new or suspicious masses or distortions
identified elsewhere within the right breast.

There are no suspicious findings in the left breast.

Targeted ultrasound is performed, showing interval decrease in size
and conspicuity of the irregular mass at the 10 o'clock position 8
cm from the nipple. Exact measurements are difficult due to the
subtle findings in this region.

Evaluation of the right axilla also demonstrates interval
improvement of multiple axillary lymph nodes. There is now diffuse
cortical thickening up to 3 mm (previously 4 mm).
IMPRESSION: 1. Slight interval progression of pleomorphic calcifications in the
upper-outer right breast spanning between the 2 biopsy sites.
2. Interval decrease in size and conspicuity of right breast mass
and right axillary lymph nodes.
3. No mammographic evidence of malignancy on the left.

RECOMMENDATION:
Per clinical treatment plan.

I have discussed the findings and recommendations with the patient.
If applicable, a reminder letter will be sent to the patient
regarding the next appointment.

BI-RADS CATEGORY  6: Known biopsy-proven malignancy.

## 2020-06-25 IMAGING — MG DIGITAL DIAGNOSTIC BILAT W/ TOMO W/ CAD
8 of 13 series · 8 of 33 positions shown · non-contrast
Comparison: Previous exam(s).

CLINICAL DATA: 39-year-old female with known right breast cancer
and diffuse metastatic disease. The patient is currently undergoing
oral chemotherapy and has not had surgery.

EXAM:
DIGITAL DIAGNOSTIC BILATERAL MAMMOGRAM WITH CAD AND TOMOSYNTHESIS
ULTRASOUND RIGHT BREAST
TECHNIQUE: Bilateral digital diagnostic mammography and breast tomosynthesis
was performed. Digital images of the breasts were evaluated with
computer-aided detection. Targeted ultrasound examination of the
Right breast was performed.

[R ML]
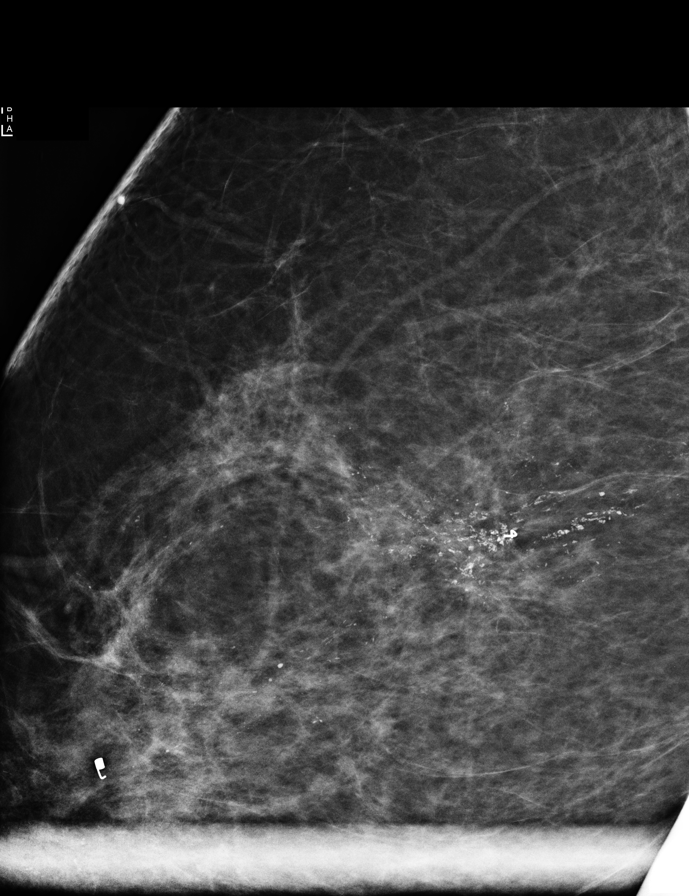

[R CC (1 of 2)]
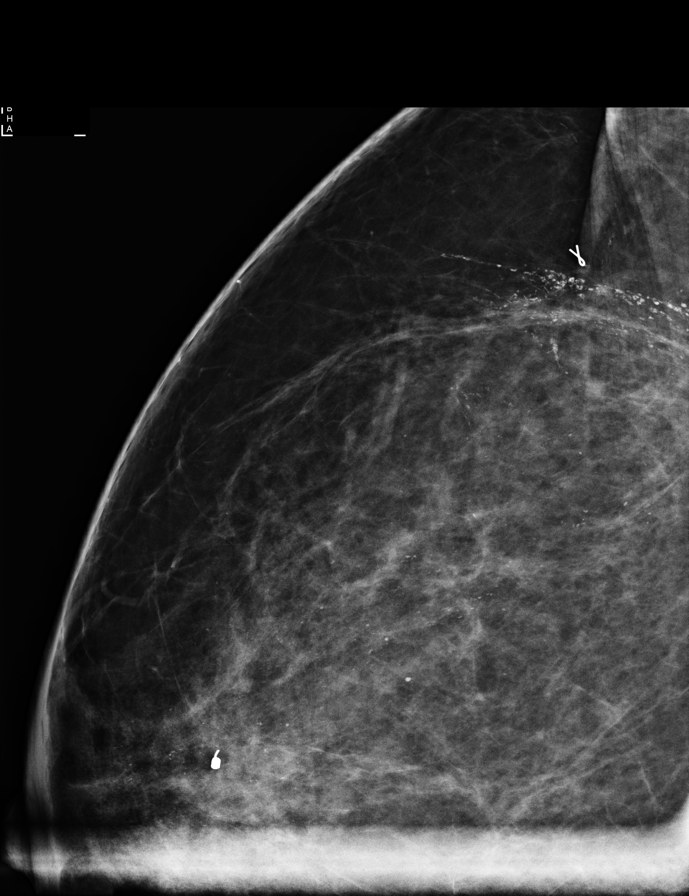

[R CC (2 of 2)]
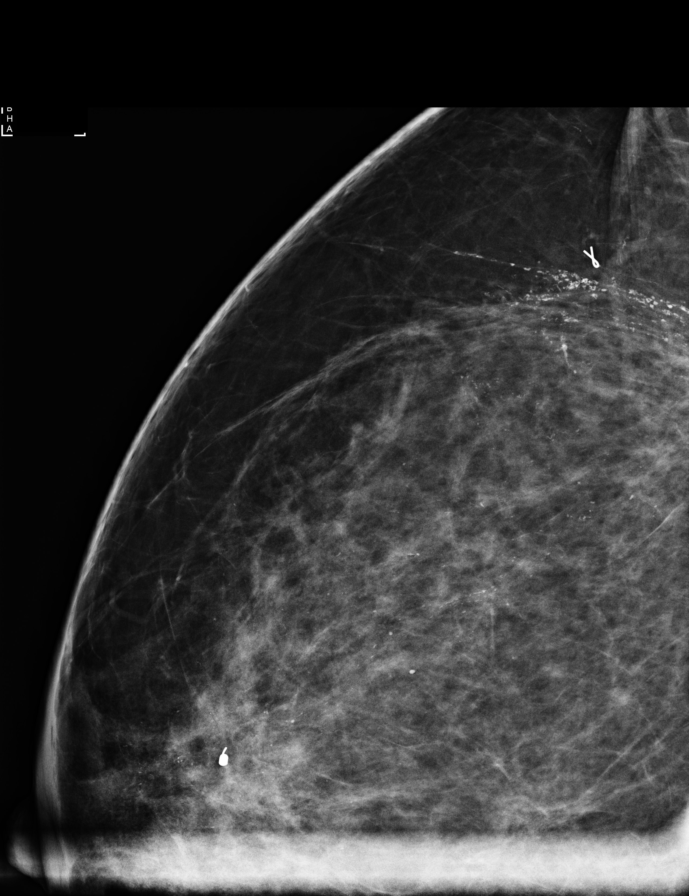

[R CC synth-2D (1 of 2)]
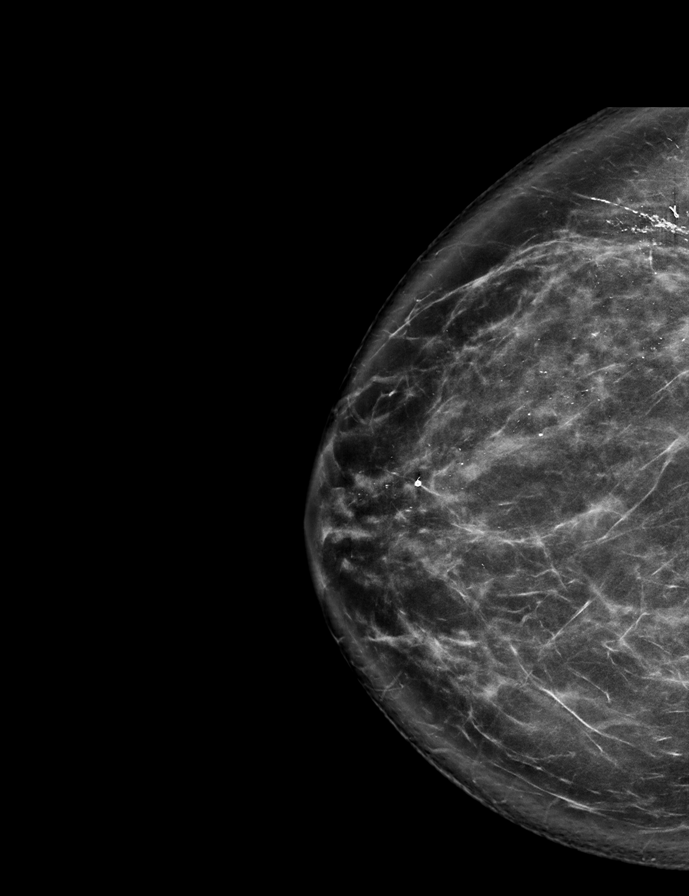

[R CC synth-2D (2 of 2)]
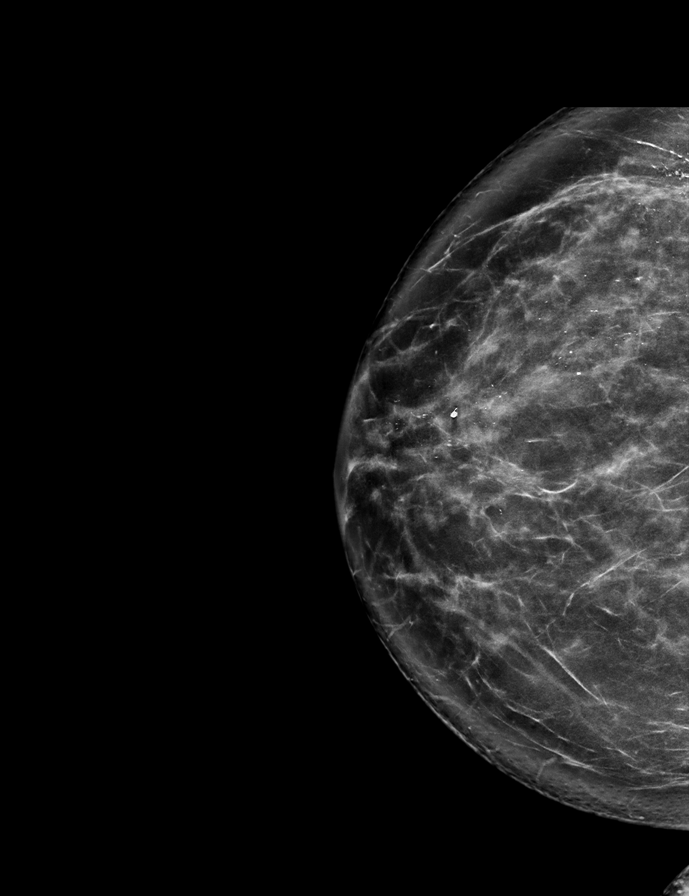

[L CC synth-2D]
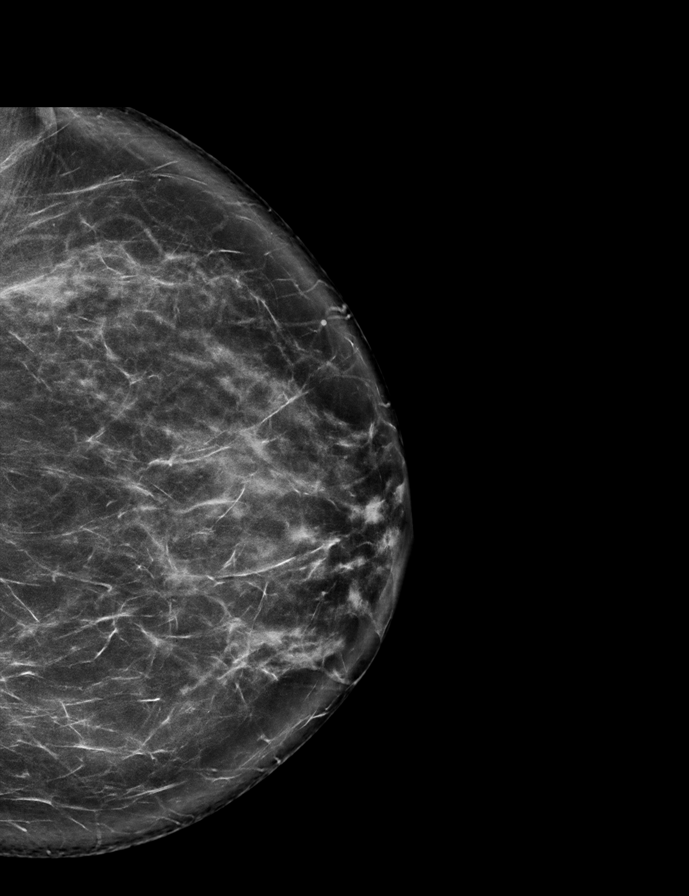

[L MLO synth-2D]
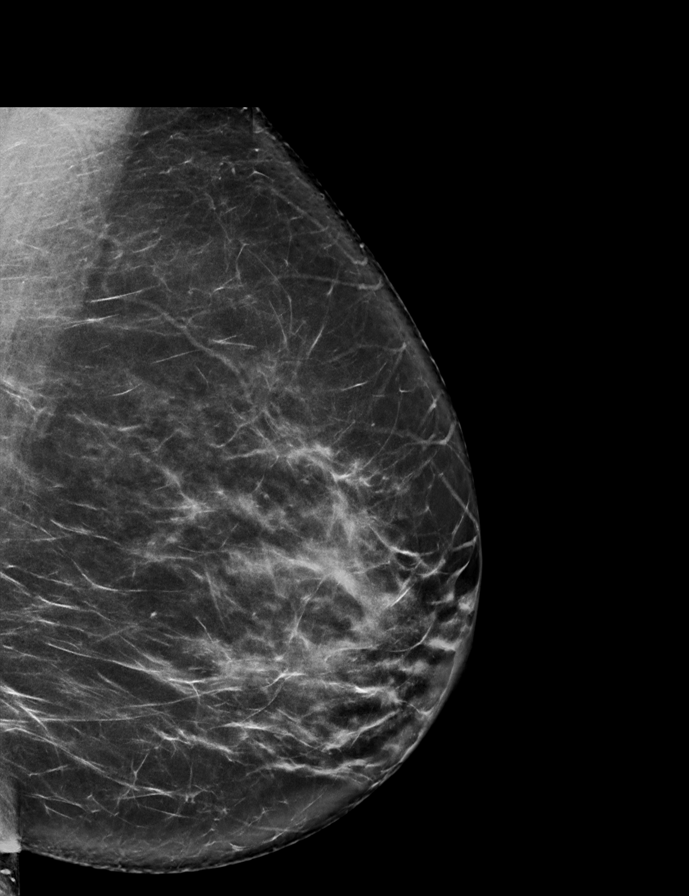

[R MLO synth-2D]
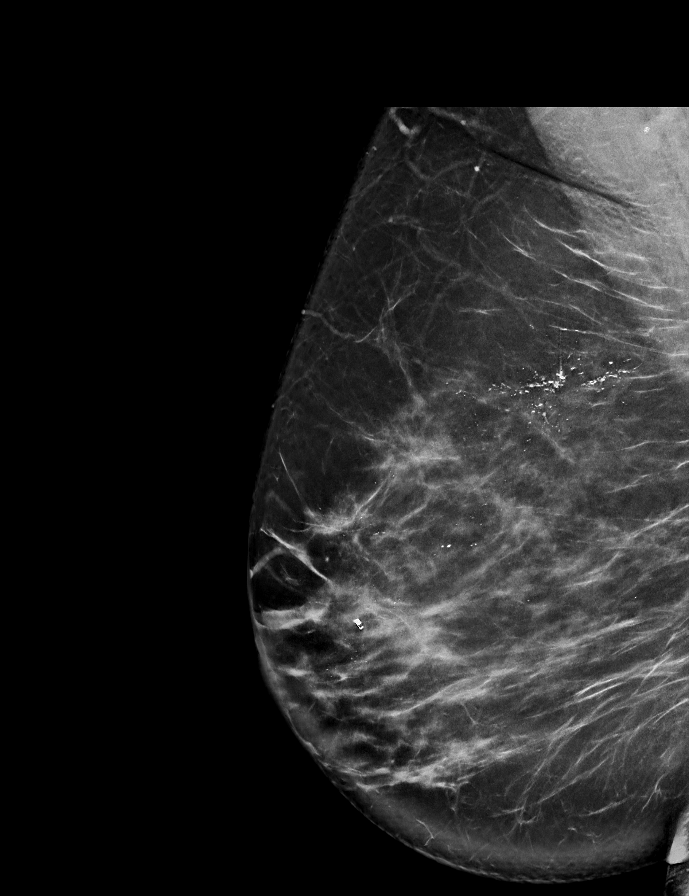

[8 of 33 positions shown; findings below may reference images not displayed]

ACR Breast Density Category c: The breast tissue is heterogeneously
dense, which may obscure small masses.
FINDINGS: Post biopsy changes noted in the upper-outer quadrant of the right
breast at anterior and posterior depth. There is been slight
interval progression of pleomorphic calcifications spanning between
the 2 biopsy sites. No new or suspicious masses or distortions
identified elsewhere within the right breast.

There are no suspicious findings in the left breast.

Targeted ultrasound is performed, showing interval decrease in size
and conspicuity of the irregular mass at the 10 o'clock position 8
cm from the nipple. Exact measurements are difficult due to the
subtle findings in this region.

Evaluation of the right axilla also demonstrates interval
improvement of multiple axillary lymph nodes. There is now diffuse
cortical thickening up to 3 mm (previously 4 mm).
IMPRESSION: 1. Slight interval progression of pleomorphic calcifications in the
upper-outer right breast spanning between the 2 biopsy sites.
2. Interval decrease in size and conspicuity of right breast mass
and right axillary lymph nodes.
3. No mammographic evidence of malignancy on the left.

RECOMMENDATION:
Per clinical treatment plan.

I have discussed the findings and recommendations with the patient.
If applicable, a reminder letter will be sent to the patient
regarding the next appointment.

BI-RADS CATEGORY  6: Known biopsy-proven malignancy.

## 2020-07-03 ENCOUNTER — Inpatient Hospital Stay: Payer: Commercial Managed Care - PPO | Attending: Oncology

## 2020-07-03 ENCOUNTER — Other Ambulatory Visit: Payer: Self-pay

## 2020-07-03 ENCOUNTER — Inpatient Hospital Stay: Payer: Commercial Managed Care - PPO

## 2020-07-03 VITALS — BP 113/81 | HR 72 | Temp 98.4°F | Resp 18

## 2020-07-03 DIAGNOSIS — C50111 Malignant neoplasm of central portion of right female breast: Secondary | ICD-10-CM | POA: Insufficient documentation

## 2020-07-03 DIAGNOSIS — Z170 Estrogen receptor positive status [ER+]: Secondary | ICD-10-CM | POA: Diagnosis not present

## 2020-07-03 DIAGNOSIS — C50411 Malignant neoplasm of upper-outer quadrant of right female breast: Secondary | ICD-10-CM

## 2020-07-03 DIAGNOSIS — C50811 Malignant neoplasm of overlapping sites of right female breast: Secondary | ICD-10-CM

## 2020-07-03 DIAGNOSIS — D5919 Other autoimmune hemolytic anemia: Secondary | ICD-10-CM

## 2020-07-03 DIAGNOSIS — E0500 Thyrotoxicosis with diffuse goiter without thyrotoxic crisis or storm: Secondary | ICD-10-CM

## 2020-07-03 DIAGNOSIS — Z79818 Long term (current) use of other agents affecting estrogen receptors and estrogen levels: Secondary | ICD-10-CM | POA: Diagnosis not present

## 2020-07-03 DIAGNOSIS — C7952 Secondary malignant neoplasm of bone marrow: Secondary | ICD-10-CM

## 2020-07-03 DIAGNOSIS — C7951 Secondary malignant neoplasm of bone: Secondary | ICD-10-CM

## 2020-07-03 LAB — CBC WITH DIFFERENTIAL/PLATELET
Abs Immature Granulocytes: 0.01 10*3/uL (ref 0.00–0.07)
Basophils Absolute: 0.1 10*3/uL (ref 0.0–0.1)
Basophils Relative: 1 %
Eosinophils Absolute: 0.3 10*3/uL (ref 0.0–0.5)
Eosinophils Relative: 8 %
HCT: 35.3 % — ABNORMAL LOW (ref 36.0–46.0)
Hemoglobin: 11.1 g/dL — ABNORMAL LOW (ref 12.0–15.0)
Immature Granulocytes: 0 %
Lymphocytes Relative: 50 %
Lymphs Abs: 2 10*3/uL (ref 0.7–4.0)
MCH: 29.1 pg (ref 26.0–34.0)
MCHC: 31.4 g/dL (ref 30.0–36.0)
MCV: 92.7 fL (ref 80.0–100.0)
Monocytes Absolute: 0.3 10*3/uL (ref 0.1–1.0)
Monocytes Relative: 7 %
Neutro Abs: 1.4 10*3/uL — ABNORMAL LOW (ref 1.7–7.7)
Neutrophils Relative %: 34 %
Platelets: 288 10*3/uL (ref 150–400)
RBC: 3.81 MIL/uL — ABNORMAL LOW (ref 3.87–5.11)
RDW: 13.4 % (ref 11.5–15.5)
WBC: 4 10*3/uL (ref 4.0–10.5)
nRBC: 0 % (ref 0.0–0.2)

## 2020-07-03 LAB — COMPREHENSIVE METABOLIC PANEL
ALT: 17 U/L (ref 0–44)
AST: 15 U/L (ref 15–41)
Albumin: 3.8 g/dL (ref 3.5–5.0)
Alkaline Phosphatase: 70 U/L (ref 38–126)
Anion gap: 9 (ref 5–15)
BUN: 15 mg/dL (ref 6–20)
CO2: 25 mmol/L (ref 22–32)
Calcium: 9.5 mg/dL (ref 8.9–10.3)
Chloride: 108 mmol/L (ref 98–111)
Creatinine, Ser: 0.83 mg/dL (ref 0.44–1.00)
GFR, Estimated: >60 mL/min (ref >60)
Glucose, Bld: 98 mg/dL (ref 70–99)
Potassium: 4.2 mmol/L (ref 3.5–5.1)
Sodium: 142 mmol/L (ref 135–145)
Total Bilirubin: 0.3 mg/dL (ref 0.3–1.2)
Total Protein: 7.4 g/dL (ref 6.5–8.1)

## 2020-07-03 MED ORDER — DENOSUMAB 120 MG/1.7ML ~~LOC~~ SOLN
120.0000 mg | Freq: Once | SUBCUTANEOUS | Status: AC
Start: 2020-07-03 — End: 2020-07-03
  Administered 2020-07-03: 120 mg via SUBCUTANEOUS

## 2020-07-03 MED ORDER — GOSERELIN ACETATE 3.6 MG ~~LOC~~ IMPL
DRUG_IMPLANT | SUBCUTANEOUS | Status: AC
  Filled 2020-07-03: qty 3.6

## 2020-07-03 MED ORDER — DENOSUMAB 120 MG/1.7ML ~~LOC~~ SOLN
SUBCUTANEOUS | Status: AC
  Filled 2020-07-03: qty 1.7

## 2020-07-03 MED ORDER — GOSERELIN ACETATE 3.6 MG ~~LOC~~ IMPL
3.6000 mg | DRUG_IMPLANT | Freq: Once | SUBCUTANEOUS | Status: AC
  Administered 2020-07-03: 3.6 mg via SUBCUTANEOUS

## 2020-07-15 ENCOUNTER — Telehealth: Payer: Self-pay

## 2020-07-15 ENCOUNTER — Other Ambulatory Visit: Payer: Self-pay

## 2020-07-15 MED ORDER — ONDANSETRON HCL 8 MG PO TABS: 8.0000 mg | ORAL_TABLET | Freq: Three times a day (TID) | ORAL | 0 refills | Status: DC | PRN

## 2020-07-15 NOTE — Telephone Encounter (Signed)
Returned call to pt regarding pt's c/o of increased fatigue and nausea. Pt states she started back in [place] on Wednesday and has had fatigue and nausea on and off since. She has not had this issue when taking Ibrance before.  No fever or abdominal pain. Pt asking if nausea prn may help. Will make MD aware.  Per Dr Jana Hakim pt notified: Zofran called in to take prn for nausea and to old ibrance for 2 days, then resume . Pt verbalized understanding.

## 2020-07-31 ENCOUNTER — Other Ambulatory Visit: Payer: Self-pay

## 2020-07-31 ENCOUNTER — Inpatient Hospital Stay: Payer: Commercial Managed Care - PPO | Attending: Oncology

## 2020-07-31 ENCOUNTER — Inpatient Hospital Stay: Payer: Commercial Managed Care - PPO

## 2020-07-31 VITALS — BP 110/94 | HR 69 | Temp 98.2°F | Resp 16

## 2020-07-31 DIAGNOSIS — Z79818 Long term (current) use of other agents affecting estrogen receptors and estrogen levels: Secondary | ICD-10-CM | POA: Insufficient documentation

## 2020-07-31 DIAGNOSIS — C7952 Secondary malignant neoplasm of bone marrow: Secondary | ICD-10-CM

## 2020-07-31 DIAGNOSIS — E0500 Thyrotoxicosis with diffuse goiter without thyrotoxic crisis or storm: Secondary | ICD-10-CM

## 2020-07-31 DIAGNOSIS — Z170 Estrogen receptor positive status [ER+]: Secondary | ICD-10-CM

## 2020-07-31 DIAGNOSIS — C50111 Malignant neoplasm of central portion of right female breast: Secondary | ICD-10-CM | POA: Diagnosis not present

## 2020-07-31 DIAGNOSIS — C7951 Secondary malignant neoplasm of bone: Secondary | ICD-10-CM

## 2020-07-31 DIAGNOSIS — C50811 Malignant neoplasm of overlapping sites of right female breast: Secondary | ICD-10-CM

## 2020-07-31 DIAGNOSIS — C50411 Malignant neoplasm of upper-outer quadrant of right female breast: Secondary | ICD-10-CM

## 2020-07-31 DIAGNOSIS — Z79899 Other long term (current) drug therapy: Secondary | ICD-10-CM | POA: Insufficient documentation

## 2020-07-31 DIAGNOSIS — D5919 Other autoimmune hemolytic anemia: Secondary | ICD-10-CM

## 2020-07-31 LAB — CBC WITH DIFFERENTIAL/PLATELET
Abs Immature Granulocytes: 0.01 10*3/uL (ref 0.00–0.07)
Basophils Absolute: 0 10*3/uL (ref 0.0–0.1)
Basophils Relative: 1 %
Eosinophils Absolute: 0.2 10*3/uL (ref 0.0–0.5)
Eosinophils Relative: 5 %
HCT: 34.7 % — ABNORMAL LOW (ref 36.0–46.0)
Hemoglobin: 11.1 g/dL — ABNORMAL LOW (ref 12.0–15.0)
Immature Granulocytes: 0 %
Lymphocytes Relative: 55 %
Lymphs Abs: 1.9 10*3/uL (ref 0.7–4.0)
MCH: 29.4 pg (ref 26.0–34.0)
MCHC: 32 g/dL (ref 30.0–36.0)
MCV: 91.8 fL (ref 80.0–100.0)
Monocytes Absolute: 0.2 10*3/uL (ref 0.1–1.0)
Monocytes Relative: 5 %
Neutro Abs: 1.2 10*3/uL — ABNORMAL LOW (ref 1.7–7.7)
Neutrophils Relative %: 34 %
Platelets: 291 10*3/uL (ref 150–400)
RBC: 3.78 MIL/uL — ABNORMAL LOW (ref 3.87–5.11)
RDW: 13.5 % (ref 11.5–15.5)
WBC: 3.5 10*3/uL — ABNORMAL LOW (ref 4.0–10.5)
nRBC: 0 % (ref 0.0–0.2)

## 2020-07-31 LAB — COMPREHENSIVE METABOLIC PANEL
ALT: 22 U/L (ref 0–44)
AST: 25 U/L (ref 15–41)
Albumin: 3.7 g/dL (ref 3.5–5.0)
Alkaline Phosphatase: 69 U/L (ref 38–126)
Anion gap: 10 (ref 5–15)
BUN: 12 mg/dL (ref 6–20)
CO2: 22 mmol/L (ref 22–32)
Calcium: 9 mg/dL (ref 8.9–10.3)
Chloride: 108 mmol/L (ref 98–111)
Creatinine, Ser: 0.85 mg/dL (ref 0.44–1.00)
GFR, Estimated: >60 mL/min (ref >60)
Glucose, Bld: 115 mg/dL — ABNORMAL HIGH (ref 70–99)
Potassium: 4.3 mmol/L (ref 3.5–5.1)
Sodium: 140 mmol/L (ref 135–145)
Total Bilirubin: 0.3 mg/dL (ref 0.3–1.2)
Total Protein: 7.3 g/dL (ref 6.5–8.1)

## 2020-07-31 MED ORDER — DENOSUMAB 120 MG/1.7ML ~~LOC~~ SOLN
120.0000 mg | Freq: Once | SUBCUTANEOUS | Status: AC
Start: 2020-07-31 — End: 2020-07-31
  Administered 2020-07-31: 120 mg via SUBCUTANEOUS

## 2020-07-31 MED ORDER — DENOSUMAB 120 MG/1.7ML ~~LOC~~ SOLN
SUBCUTANEOUS | Status: AC
  Filled 2020-07-31: qty 1.7

## 2020-07-31 MED ORDER — GOSERELIN ACETATE 3.6 MG ~~LOC~~ IMPL
3.6000 mg | DRUG_IMPLANT | Freq: Once | SUBCUTANEOUS | Status: AC
  Administered 2020-07-31: 3.6 mg via SUBCUTANEOUS

## 2020-07-31 MED ORDER — GOSERELIN ACETATE 3.6 MG ~~LOC~~ IMPL
DRUG_IMPLANT | SUBCUTANEOUS | Status: AC
  Filled 2020-07-31: qty 3.6

## 2020-07-31 NOTE — Patient Instructions (Signed)
Denosumab injection What is this medicine? DENOSUMAB (den oh sue mab) slows bone breakdown. Prolia is used to treat osteoporosis in women after menopause and in men, and in people who are taking corticosteroids for 6 months or more. Xgeva is used to treat a high calcium level due to cancer and to prevent bone fractures and other bone problems caused by multiple myeloma or cancer bone metastases. Xgeva is also used to treat giant cell tumor of the bone. This medicine may be used for other purposes; ask your health care provider or pharmacist if you have questions. COMMON BRAND NAME(S): Prolia, XGEVA What should I tell my health care provider before I take this medicine? They need to know if you have any of these conditions:  dental disease  having surgery or tooth extraction  infection  kidney disease  low levels of calcium or Vitamin D in the blood  malnutrition  on hemodialysis  skin conditions or sensitivity  thyroid or parathyroid disease  an unusual reaction to denosumab, other medicines, foods, dyes, or preservatives  pregnant or trying to get pregnant  breast-feeding How should I use this medicine? This medicine is for injection under the skin. It is given by a health care professional in a hospital or clinic setting. A special MedGuide will be given to you before each treatment. Be sure to read this information carefully each time. For Prolia, talk to your pediatrician regarding the use of this medicine in children. Special care may be needed. For Xgeva, talk to your pediatrician regarding the use of this medicine in children. While this drug may be prescribed for children as young as 13 years for selected conditions, precautions do apply. Overdosage: If you think you have taken too much of this medicine contact a poison control center or emergency room at once. NOTE: This medicine is only for you. Do not share this medicine with others. What if I miss a dose? It is  important not to miss your dose. Call your doctor or health care professional if you are unable to keep an appointment. What may interact with this medicine? Do not take this medicine with any of the following medications:  other medicines containing denosumab This medicine may also interact with the following medications:  medicines that lower your chance of fighting infection  steroid medicines like prednisone or cortisone This list may not describe all possible interactions. Give your health care provider a list of all the medicines, herbs, non-prescription drugs, or dietary supplements you use. Also tell them if you smoke, drink alcohol, or use illegal drugs. Some items may interact with your medicine. What should I watch for while using this medicine? Visit your doctor or health care professional for regular checks on your progress. Your doctor or health care professional may order blood tests and other tests to see how you are doing. Call your doctor or health care professional for advice if you get a fever, chills or sore throat, or other symptoms of a cold or flu. Do not treat yourself. This drug may decrease your body's ability to fight infection. Try to avoid being around people who are sick. You should make sure you get enough calcium and vitamin D while you are taking this medicine, unless your doctor tells you not to. Discuss the foods you eat and the vitamins you take with your health care professional. See your dentist regularly. Brush and floss your teeth as directed. Before you have any dental work done, tell your dentist you are   receiving this medicine. Do not become pregnant while taking this medicine or for 5 months after stopping it. Talk with your doctor or health care professional about your birth control options while taking this medicine. Women should inform their doctor if they wish to become pregnant or think they might be pregnant. There is a potential for serious side  effects to an unborn child. Talk to your health care professional or pharmacist for more information. What side effects may I notice from receiving this medicine? Side effects that you should report to your doctor or health care professional as soon as possible:  allergic reactions like skin rash, itching or hives, swelling of the face, lips, or tongue  bone pain  breathing problems  dizziness  jaw pain, especially after dental work  redness, blistering, peeling of the skin  signs and symptoms of infection like fever or chills; cough; sore throat; pain or trouble passing urine  signs of low calcium like fast heartbeat, muscle cramps or muscle pain; pain, tingling, numbness in the hands or feet; seizures  unusual bleeding or bruising  unusually weak or tired Side effects that usually do not require medical attention (report to your doctor or health care professional if they continue or are bothersome):  constipation  diarrhea  headache  joint pain  loss of appetite  muscle pain  runny nose  tiredness  upset stomach This list may not describe all possible side effects. Call your doctor for medical advice about side effects. You may report side effects to FDA at 1-800-FDA-1088. Where should I keep my medicine? This medicine is only given in a clinic, doctor's office, or other health care setting and will not be stored at home. NOTE: This sheet is a summary. It may not cover all possible information. If you have questions about this medicine, talk to your doctor, pharmacist, or health care provider.  2021 Elsevier/Gold Standard (2017-09-23 16:10:44)

## 2020-08-07 ENCOUNTER — Telehealth: Payer: Self-pay

## 2020-08-07 NOTE — Telephone Encounter (Signed)
Oral Oncology Patient Advocate Encounter  Met patient in [facility] to complete application for [employer] in an effort to reduce patient's out of pocket expense for Ibrance to $0.    Application completed and faxed to [phone].   [employer] patient assistance phone number for follow up is [phone].   This encounter will be updated until final determination.   Cruzita Lederer CPHT [facility] Patient Advocate [facility] Phone [phone] Fax [phone] 08/07/2020 10:03 AM

## 2020-08-07 NOTE — Telephone Encounter (Signed)
Patient is approved for Ibrance at no cost from [employer] 08/05/20-05/30/21  [facility] [place] Phone [phone] Fax [phone] 08/07/2020 10:04 AM

## 2020-08-27 NOTE — Progress Notes (Signed)
[name] [name]  [phone] [phone]     ID: [name] [name] K Diefenderfer DOB: 08/14/1980  MR#: [id number]  QZR#:[id number]  Patient Care Team: Townsend Roger, MD as PCP - General (Internal Medicine) Derwood Kaplan, MD as PCP - Hematology/Oncology (Oncology) Samy Ryner, Virgie Dad, MD as Consulting Physician (Oncology) Jacelyn Pi, MD as Referring Physician (Endocrinology) Armandina Gemma, MD as Consulting Physician (General Surgery) Servando Salina, MD as Consulting Physician (Obstetrics and Gynecology) Chauncey Cruel, MD OTHER MD:  CHIEF COMPLAINT: metastatic breast cancer, estrogen receptor positive  CURRENT TREATMENT: letrozole, palbociclib, goserelin, denosumab   INTERVAL HISTORY: [name] [name] returns today for follow up of her metastatic breast cancer.  Since her last visit, she underwent bilateral diagnostic mammography with and right breast ultrasonography at [facility] on 06/25/2020 showing: breast density category C; slight interval progression of pleomorphic calcifications in upper-outer right breast spanning between two biopsy sites; interval decrease in size and conspicuity of right breast mass and right axillary lymph nodes; no evidence of malignancy on left.  She takes letrozole daily. She has "hot flashes all the time" though they do not keep her up at night.  She also is having some vaginal dryness issues.  . She takes the palbociclib currently at 100 mg daily, 21 days on 7 days off.  She completed the current cycle yesterday 08/27/2020.  She has a little bit of nausea when she takes this.  She uses Zofran to take care of the problem.  She receives denosumab/Xgeva every 4 weeks. She was initially hypocalcemic but now she takes calcium supplementation and has had no further problems with that.  She has an appointment with her dentist in July.  She has not been able to get an appointment before that.  She has no dental complaints at this  point.  She receives goserelin/Zoladex also every 4 weeks.  Aside from menopausal symptoms she has no problems from this medication.  REVIEW OF SYSTEMS: [name] [name] has been going to the gym about 4 days a week for the last month.  She is enjoying that.  She says her blood sugars are looking better.  She has more energy.  She would like to lose belly fat she says.  She is aware that "fat makes estrogen".  She wonders if she should have mastectomy.  She did discuss this previously with Dr. Laureen Abrahams and she remembers Dr. Remi Deter statements but she wanted to understand them better.  Aside from that a detailed review of systems today was noncontributory   COVID 19 VACCINATION STATUS: Status post Moderna x3, third dose October 2021; booster pending   HISTORY OF CURRENT ILLNESS: From the [facility] note dated 04/10/2020 by Dickey Gave, PA-C:   "[name] [name] K Rayon is a 40 y.o. female African American female with stage IV (T3 N1 M1) hormone receptor positive right breast cancer diagnosed in December 2020. We began seeing in November 2020 for leukocytosis, anemia and thrombocytopenia.  She had bruising and nose bleeds, as well as a 10-15 lb weight loss.  She had been having chronic back pain for several months, for which she was using ibuprofen or Aleve.  LDH was markedly elevated at 2599, reticulocyte count mildly elevated at 3.4% and haptoglobin was less than 10, which was consistent with hemolysis, but Coombs was negative.  CT chest, abdomen and pelvis revealed diffusely sclerotic osseous structures with multiple lytic lesions noted throughout the axial skeleton.  Some lesions demonstrated expansile soft tissue components, and these findings  are highly concerning for multiple myeloma or other osseous metastatic disease of uncertain origin.  A spiculated appearing mass or tissue element was also seen in the lateral right breast, 9 o'clock.  Bone marrow biopsy revealed abundant metastatic  carcinoma, which was estrogen receptor positive, so consistent with breast cancer origin.  With that information, it became clear that she had malignant breast cancer that had metastasized to the bone.  She underwent a digital diagnostic bilateral mammogram with right breast ultrasound in December, which revealed highly suspicious calcifications spanning at least 9.2 cm in the upper outer quadrant of the right breast with distortion associated with the posterior calcifications.  A discrete mass was seen in the right breast with ultrasound at 10 o'clock, 8 cm from the nipple measuring 9 x 6 x 7 mm.  Several mildly abnormal nodes are seen in the right axilla with cortices measuring between 3 and 5 mm.  The lymph nodes were symmetric on mammography and were not definitely different compared to 2015.  Stereotactic needle core biopsy of the right breast and right axilla confirmed grade 2, invasive ductal carcinoma, as well as ductal carcinoma in situ.  The invasive component measured 0.9 cm in greatest linear extent on the core biopsy.  Metastatic carcinoma was involved in one lymph node.  Estrogen receptor positive at 90%, progesterone receptor positive at 1%, and HER2 negative.  Ki67 was 15%.  Nuclear medicine PET scan revealed widespread hypermetabolic bony metastatic disease.  Low level hypermetabolism in bilateral axillary nodes, right greater than left was seen with uptake identified in the ill defined soft tissue lesion in the lateral right breast.  Foci of hypermetabolism identified in the central uterus along the IUD and along the anterior cervix and adjacent small bowel, which were nonspecific.  MRI head did not reveal intracranial metastasis, but diffusely abnormal bone marrow in the skull and cervical spine likely due to metastatic disease was observed.  CA 27-29 was not elevated.   Due to her age of diagnosis, she underwent testing for hereditary breast cancer with the [facility]  myRisk hereditary cancer panel  test.  This did not reveal any clinically significant mutation.  There were variants of uncertain significance of the ATM, AXIN2 and MSH3 genes.  She had severer pain, so was placed on methadone, in addition to oxycodone and the doses were titrated up.  We recommended letrozole/palbociclib, denosumab monthly for the bone metastasis and goserelin monthly to suppress ovarian function due to being premenopausal.  She started letrozole on December 28th and denosumab and goserelin on January 7th.  She started palbociclib for 3 weeks on and one week off on January 9th.   She develops severe hypocalcemia, which required IV calcium replacement. Denosumab had to be held due to the severe hypocalcemia, but was resumed in March.  She was placed on high doses of oral calcium.  She had multiple other issues, such as decreased appetite, nausea and vomiting, as well  As dehydration, managed with medications. MRI thoracic and lumbar spine on January 19th revealed diffusely decreased T1 bone marrow signal with fairly homogeneous enhancement throughout the thoracolumbar spine.  Prior PET-CT demonstrated homogeneous hypermetabolic activity throughout the thoracolumbar spine.  These findings are favored to be related to the patient's anemia rather than diffuse metastatic disease.  There were possible small osseous metastases, notably within the T3 spinous process, left L2 pedicle and upper right sacrum.  There was no significant disc herniation, spinal stenosis or nerve root encroachment.  She was subsequently weaned  off methadone and has not had recurrent pain.  She developed cytopenias due to palbociclib and we occasionally had to delay cycles  CT chest, abdomen and pelvis in May revealed diffuse patchy confluent sclerotic osseous metastatic disease throughout the axial and proximal appendicular skeleton, probably representing treatment effect.  ThereWere healed deformities throughout the bilateral ribs.  A new mild right  axillary lymphadenopathy was also observed, which wasnonspecific.  No additional sites of metastatic disease were seen in the chest, abdomen or pelvis.  She had a  tubal ligation and removal of her Mirena IUD in June.  CT chest, abdomen and pelvis in September revealed again widespread osseous sclerosis, indicative of treated metastatic disease.  The previous borderline enlarged right axillary lymph nodes had resolved.  She had recurrent cytopenias, and since we had to delay multiple cycles of palbociclib, we decreased her palbociclib dose to 100 mg daily for 3 weeks on a week off."  The patient's subsequent history is as detailed below.   PAST MEDICAL HISTORY: Past Medical History:  Diagnosis Date  . Anemia   . History of cardiac murmur as a child   . History of Graves' disease    dx hyperthyroidism during 4th pregnancy;   11-15-2013  s/p  total thyroidectomy  . 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 Pre-diabetes   . Stage IV breast cancer in female [facility]) 05/2019   oncologist-- dr c. Hinton Rao ([facility]) bone marrow bx 05-08-2019 and right breast bx 05-17-2020--- primary breast w/ mets to bones, pt taking oral chemo (ibrance)  Tumor associated hemolysis at presentation December 2020  PAST SURGICAL HISTORY: Past Surgical History:  Procedure Laterality Date  . IUD REMOVAL N/A 11/22/2019   Procedure: INTRAUTERINE DEVICE (IUD) REMOVAL;  Surgeon: Servando Salina, MD;  Location: [facility];  Service: Gynecology;  Laterality: N/A;  . LAPAROSCOPIC TUBAL LIGATION Bilateral 11/22/2019   Procedure: LAPAROSCOPIC TUBAL LIGATION By Cautery;  Surgeon: Servando Salina, MD;  Location: [facility];  Service: Gynecology;  Laterality: Bilateral;  . THYROIDECTOMY Bilateral 11/15/2013   Procedure: TOTAL THYROIDECTOMY;  Surgeon: Earnstine Regal, MD;  Location: [facility];  Service: General;  Laterality: Bilateral;  . WISDOM TOOTH  EXTRACTION  2012    FAMILY HISTORY: Family History  Problem Relation Age of Onset  . Cancer Mother        Peripheral T cell Lymphoma  . Diabetes Father   . Diabetes Maternal Grandmother   The patient's mother died at age 76 shortly after her diagnosis of T-cell non-Hodgkin's lymphoma. The patient's father is 26 years old as of December 2020. He has a history of diabetes, is a bilateral amputee, and has a history of EtOH abuse. The patient has 2 half siblings on her mother side and 3/2 siblings on her father's side. None of them have a history of cancer. She has a maternal cousin with a history of B-cell non-Hodgkin's lymphoma and a maternal grandfather with prostate cancer   GYNECOLOGIC HISTORY:  Patient's last menstrual period was 10/26/2018. Menarche: 40 years old Age at first live birth: 40 years old [place] P 4 LMP December 2020 (when goserelin started Contraceptive: Status post bilateral tubal ligation HRT n/a  Hysterectomy? no BSO? no   SOCIAL HISTORY: (updated 04/2020)  [name] [name] is divorced. Her former husband Nayvie Lips is a Armed forces technical officer for the [employer]. 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 Her daughters were called [name], [place], [place], and [place], age 43-9 as of December  2021. They stay 1 week with the patient in 1 week with her ex-husband. The patient also has a burn doodle called Karlene Lineman. The patient is a Psychologist, forensic    ADVANCED DIRECTIVES: Not in place. At the 05/06/2020 visit the patient was given the appropriate documents to complete and notarized at her discretion.   HEALTH MAINTENANCE: Social History   Tobacco Use  . Smoking status: Never Smoker  . Smokeless tobacco: Never Used  Vaping Use  . Vaping Use: Never used  Substance Use Topics  . Alcohol use: Not Currently    Comment: social  . Drug use: Never     Colonoscopy: n/a (age)  PAP: Up-to-date  Bone density: Remote   No Known Allergies  Current Outpatient Medications  Medication Sig Dispense  Refill  . b complex vitamins capsule Take 1 capsule by mouth daily.    . Calcium Carb-Cholecalciferol (CALCIUM 600 + D PO) Take 4 tablets by mouth daily.    . Cholecalciferol (VITAMIN D3) 25 MCG (1000 UT) CAPS Take 3 capsules by mouth daily.    . Denosumab (XGEVA [place]) Inject into the skin every 30 (thirty) days.    . Goserelin Acetate (ZOLADEX [place]) Inject into the skin every 30 (thirty) days.    Leslee Home 100 MG tablet Take 1 tablet (100 mg total) by mouth as directed. 21 tablet 6  . ibuprofen (ADVIL) 800 MG tablet Take 1 tablet (800 mg total) by mouth every 8 (eight) hours as needed for moderate pain. 30 tablet 5  . letrozole (FEMARA) 2.5 MG tablet Take 1 tablet (2.5 mg total) by mouth daily. 90 tablet 4  . levothyroxine (SYNTHROID) 125 MCG tablet Take 125 mcg by mouth every morning.     . metFORMIN (GLUCOPHAGE) 500 MG tablet Take 1 tablet (500 mg total) by mouth 2 (two) times daily with a meal.    . ondansetron (ZOFRAN) 8 MG tablet Take 1 tablet (8 mg total) by mouth 3 (three) times daily as needed for nausea or vomiting. 20 tablet 0  . valACYclovir (VALTREX) 500 MG tablet Take 1 tablet (500 mg total) by mouth daily. 90 tablet 4   No current facility-administered medications for this visit.    OBJECTIVE: African-American woman who appears well  Vitals:   08/28/20 0921  BP: 119/64  Pulse: 78  Resp: 18  Temp: (!) 97.5 F (36.4 C)  SpO2: 99%     Body mass index is 33.7 kg/m.   Wt Readings from Last 3 Encounters:  08/28/20 202 lb 8 oz (91.9 kg)  06/05/20 199 lb 12.8 oz (90.6 kg)  05/06/20 203 lb 4.8 oz (92.2 kg)      ECOG FS:1 - Symptomatic but completely ambulatory  Sclerae unicteric, EOMs intact Wearing a mask No cervical or supraclavicular adenopathy Lungs no rales or rhonchi Heart regular rate and rhythm Abd soft, nontender, positive bowel sounds MSK no focal spinal tenderness, no upper extremity lymphedema Neuro: nonfocal, well oriented, appropriate affect Breasts: No  palpable masses.  No skin or nipple changes of concern.  Both axillae are benign.  Lesion posterior Right shoulder ("been there all my life")    LAB RESULTS:  CMP     Component Value Date/Time   NA 142 08/28/2020 0901   NA 140 04/16/2020 0000   K 4.0 08/28/2020 0901   CL 110 08/28/2020 0901   CO2 22 08/28/2020 0901   GLUCOSE 137 (H) 08/28/2020 0901   BUN 12 08/28/2020 0901   BUN 18 04/16/2020 0000  CREATININE 0.81 08/28/2020 0901   CREATININE 0.96 05/06/2020 1532   CALCIUM 8.3 (L) 08/28/2020 0901   PROT 6.8 08/28/2020 0901   ALBUMIN 3.4 (L) 08/28/2020 0901   AST 24 08/28/2020 0901   AST 19 05/06/2020 1532   ALT 23 08/28/2020 0901   ALT 19 05/06/2020 1532   ALKPHOS 78 08/28/2020 0901   BILITOT 0.3 08/28/2020 0901   BILITOT 0.3 05/06/2020 1532   GFRNONAA >60 08/28/2020 0901   GFRNONAA >60 05/06/2020 1532   GFRAA >60 04/28/2019 1218    No results found for: TOTALPROTELP, ALBUMINELP, A1GS, A2GS, BETS, BETA2SER, GAMS, MSPIKE, SPEI  Lab Results  Component Value Date   WBC 3.4 (L) 08/28/2020   NEUTROABS 1.2 (L) 08/28/2020   HGB 9.6 (L) 08/28/2020   HCT 30.2 (L) 08/28/2020   MCV 92.4 08/28/2020   PLT 226 08/28/2020    No results found for: LABCA2  No components found for: [id number]  No results for input(s): INR in the last 168 hours.  No results found for: LABCA2  No results found for: RFX588  No results found for: TGP498  No results found for: YME158  Lab Results  Component Value Date   CA2729 10.7 05/06/2020    No components found for: HGQUANT  No results found for: CEA1 / No results found for: CEA1   No results found for: AFPTUMOR  No results found for: CHROMOGRNA  No results found for: KPAFRELGTCHN, LAMBDASER, KAPLAMBRATIO (kappa/lambda light chains)  No results found for: HGBA, HGBA2QUANT, HGBFQUANT, HGBSQUAN (Hemoglobinopathy evaluation)   Lab Results  Component Value Date   LDH 317 (H) 05/06/2020    No results found for: IRON,  TIBC, IRONPCTSAT (Iron and TIBC)  Lab Results  Component Value Date   FERRITIN 99 05/06/2020    Urinalysis    Component Value Date/Time   COLORURINE YELLOW 04/28/2019 1223   APPEARANCEUR HAZY (A) 04/28/2019 1223   LABSPEC 1.024 04/28/2019 1223   PHURINE 5.0 04/28/2019 1223   GLUCOSEU NEGATIVE 04/28/2019 1223   HGBUR NEGATIVE 04/28/2019 [place] 04/28/2019 [place] 04/28/2019 [place] 04/28/2019 1223   NITRITE NEGATIVE 04/28/2019 [place] 04/28/2019 1223    STUDIES: No results found.   ELIGIBLE FOR AVAILABLE RESEARCH PROTOCOL: no  ASSESSMENT: 40 y.o. [place] woman presenting NOV 2020 with stage IV breast cancer as follows:  (a) workup of initial pancytopenia and hemolysis showed a leukoerythroblastic blood picture with bone marrow biopsy 05/07/2020 confirming metastatic carcinoma, estrogen receptor positive; cytogenetics were normal  (b) CT scans at baseline showing multiple bone lesions (both sclerotic and lytic) and a possible mass in the right breast; no definitive lung or liver involvement  (c) right breast upper outer quadrant biopsy x2 and right axillary lymph node biopsy on 05/18/2019 confirmed a clinical 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, grade 1-2, estrogen receptor strongly positive, progesterone receptor moderately positive (1%), HER-2 negative, with an MIB-1 of 2-15%  (d) CA 27-29 was not informative (repeat 05/06/2020 WNL)  (1) letrozole started 05/28/2019  (a) goserelin started 06/07/2019, repeated every 4 weeks  (b) palbociclib added 06/09/2019  (c) palbociclib dose decreased to 100 mg daily, 21/7, with September 2021 cycle  (2) denosumab/Xgeva started 06/07/2019, repeated every 4 weeks  (3) genetics testing through the [facility] MyRisk panel dated 09/22/2019 found no deleterious mutations in the genes tested which included BRCA 1 and 2, CHEK2, PALB 2 and TP53 among  others.  (a) variants  of uncertain significance were found in ATM, AXN 2, and [place] 3.  (4) restaging studies:  (a) chest CT and bone scan 05/21/2020 shows no visceral disease, stable bone lesions  (b) bilateral mammography and right breast ultrasonography 06/25/2020 shows the measurable disease in the right breast to have decreased.  There is some progressive calcifications that require monitoring   PLAN: [name] [name] is now close to a year and a half out from initial diagnosis of metastatic breast cancer.  Her disease is well controlled and she has no symptoms currently related to her cancer.  She is also tolerating treatment well.  The plan then is to continue monthly goserelin which allows her to take letrozole; monthly denosumab, and she understands that she does need to get dental review every 6 months (she is scheduled for July), and palbociclib currently at 100 mg daily 3 weeks on and 1 week off.  Her counts today are fine so we do not need to make any change in the palbociclib dose.  We reviewed the fact that having a mastectomy which she expressed some interest in would not be helpful in her case.  The cancer is already in other parts of her body.  Also the only place where we actually have measurable disease is the breast so the plan is to continue to observe and not proceed to mastectomy.  We discussed exercise and weight loss issues and how that affects estrogen production in the body and she understands that she is on letrozole which blocks estrogen production and that is the point of that medication  In brief we are going to continue as we have been doing.  She will receive her injections today and every 28 days.  I am going to see her with the June treatments and before that she will have a repeat bone scan and repeat mammogram and ultrasound on the right.  She knows to call for any other issue that may develop before then  Total encounter time 35 minutes.Sarajane Jews C. Smaran Gaus, MD  08/28/2020 9:56 AM Medical Oncology and Hematology [facility] [place], [place] [address] Tel. [phone]    Fax. [phone]   This document serves as a record of services personally performed by Lurline Del, MD. It was created on his behalf by Wilburn Mylar, a trained medical scribe. The creation of this record is based on the scribe's personal observations and the provider's statements to them.   I, Lurline Del MD, have reviewed the above documentation for accuracy and completeness, and I agree with the above.   *Total Encounter Time as defined by the Centers for Medicare and Medicaid Services includes, in addition to the face-to-face time of a patient visit (documented in the note above) non-face-to-face time: obtaining and reviewing outside history, ordering and reviewing medications, tests or procedures, care coordination (communications with other health care professionals or caregivers) and documentation in the medical record.

## 2020-08-28 ENCOUNTER — Inpatient Hospital Stay: Payer: Commercial Managed Care - PPO

## 2020-08-28 ENCOUNTER — Inpatient Hospital Stay (HOSPITAL_BASED_OUTPATIENT_CLINIC_OR_DEPARTMENT_OTHER): Payer: Commercial Managed Care - PPO | Admitting: Oncology

## 2020-08-28 ENCOUNTER — Other Ambulatory Visit: Payer: Self-pay

## 2020-08-28 VITALS — BP 119/64 | HR 78 | Temp 97.5°F | Resp 18 | Ht 65.0 in | Wt 202.5 lb

## 2020-08-28 DIAGNOSIS — Z170 Estrogen receptor positive status [ER+]: Secondary | ICD-10-CM

## 2020-08-28 DIAGNOSIS — C50811 Malignant neoplasm of overlapping sites of right female breast: Secondary | ICD-10-CM

## 2020-08-28 DIAGNOSIS — C7951 Secondary malignant neoplasm of bone: Secondary | ICD-10-CM

## 2020-08-28 DIAGNOSIS — C50111 Malignant neoplasm of central portion of right female breast: Secondary | ICD-10-CM | POA: Diagnosis not present

## 2020-08-28 DIAGNOSIS — C50411 Malignant neoplasm of upper-outer quadrant of right female breast: Secondary | ICD-10-CM

## 2020-08-28 DIAGNOSIS — E0500 Thyrotoxicosis with diffuse goiter without thyrotoxic crisis or storm: Secondary | ICD-10-CM

## 2020-08-28 DIAGNOSIS — D5919 Other autoimmune hemolytic anemia: Secondary | ICD-10-CM

## 2020-08-28 DIAGNOSIS — C7952 Secondary malignant neoplasm of bone marrow: Secondary | ICD-10-CM

## 2020-08-28 LAB — COMPREHENSIVE METABOLIC PANEL
ALT: 23 U/L (ref 0–44)
AST: 24 U/L (ref 15–41)
Albumin: 3.4 g/dL — ABNORMAL LOW (ref 3.5–5.0)
Alkaline Phosphatase: 78 U/L (ref 38–126)
Anion gap: 10 (ref 5–15)
BUN: 12 mg/dL (ref 6–20)
CO2: 22 mmol/L (ref 22–32)
Calcium: 8.3 mg/dL — ABNORMAL LOW (ref 8.9–10.3)
Chloride: 110 mmol/L (ref 98–111)
Creatinine, Ser: 0.81 mg/dL (ref 0.44–1.00)
GFR, Estimated: >60 mL/min (ref >60)
Glucose, Bld: 137 mg/dL — ABNORMAL HIGH (ref 70–99)
Potassium: 4 mmol/L (ref 3.5–5.1)
Sodium: 142 mmol/L (ref 135–145)
Total Bilirubin: 0.3 mg/dL (ref 0.3–1.2)
Total Protein: 6.8 g/dL (ref 6.5–8.1)

## 2020-08-28 LAB — CBC WITH DIFFERENTIAL/PLATELET
Abs Immature Granulocytes: 0.01 10*3/uL (ref 0.00–0.07)
Basophils Absolute: 0 10*3/uL (ref 0.0–0.1)
Basophils Relative: 1 %
Eosinophils Absolute: 0 10*3/uL (ref 0.0–0.5)
Eosinophils Relative: 1 %
HCT: 30.2 % — ABNORMAL LOW (ref 36.0–46.0)
Hemoglobin: 9.6 g/dL — ABNORMAL LOW (ref 12.0–15.0)
Immature Granulocytes: 0 %
Lymphocytes Relative: 55 %
Lymphs Abs: 1.9 10*3/uL (ref 0.7–4.0)
MCH: 29.4 pg (ref 26.0–34.0)
MCHC: 31.8 g/dL (ref 30.0–36.0)
MCV: 92.4 fL (ref 80.0–100.0)
Monocytes Absolute: 0.3 10*3/uL (ref 0.1–1.0)
Monocytes Relative: 7 %
Neutro Abs: 1.2 10*3/uL — ABNORMAL LOW (ref 1.7–7.7)
Neutrophils Relative %: 36 %
Platelets: 226 10*3/uL (ref 150–400)
RBC: 3.27 MIL/uL — ABNORMAL LOW (ref 3.87–5.11)
RDW: 14.1 % (ref 11.5–15.5)
WBC: 3.4 10*3/uL — ABNORMAL LOW (ref 4.0–10.5)
nRBC: 0 % (ref 0.0–0.2)

## 2020-08-28 MED ORDER — GOSERELIN ACETATE 3.6 MG ~~LOC~~ IMPL
DRUG_IMPLANT | SUBCUTANEOUS | Status: AC
  Filled 2020-08-28: qty 3.6

## 2020-08-28 MED ORDER — GOSERELIN ACETATE 3.6 MG ~~LOC~~ IMPL
3.6000 mg | DRUG_IMPLANT | Freq: Once | SUBCUTANEOUS | Status: AC
  Administered 2020-08-28: 3.6 mg via SUBCUTANEOUS

## 2020-08-28 MED ORDER — DENOSUMAB 120 MG/1.7ML ~~LOC~~ SOLN
120.0000 mg | Freq: Once | SUBCUTANEOUS | Status: AC
  Administered 2020-08-28: 120 mg via SUBCUTANEOUS

## 2020-08-28 MED ORDER — DENOSUMAB 120 MG/1.7ML ~~LOC~~ SOLN
SUBCUTANEOUS | Status: AC
  Filled 2020-08-28: qty 1.7

## 2020-08-28 NOTE — Patient Instructions (Signed)
Goserelin injection What is this medicine? GOSERELIN (GOE se rel in) is similar to a hormone found in the body. It lowers the amount of sex hormones that the body makes. Men will have lower testosterone levels and women will have lower estrogen levels while taking this medicine. In men, this medicine is used to treat prostate cancer; the injection is either given once per month or once every 12 weeks. A once per month injection (only) is used to treat women with endometriosis, dysfunctional uterine bleeding, or advanced breast cancer. This medicine may be used for other purposes; ask your health care provider or pharmacist if you have questions. COMMON BRAND NAME(S): Zoladex What should I tell my health care provider before I take this medicine? They need to know if you have any of these conditions:  bone problems  diabetes  heart disease  history of irregular heartbeat  an unusual or allergic reaction to goserelin, other medicines, foods, dyes, or preservatives  pregnant or trying to get pregnant  breast-feeding How should I use this medicine? This medicine is for injection under the skin. It is given by a health care professional in a hospital or clinic setting. Talk to your pediatrician regarding the use of this medicine in children. Special care may be needed. Overdosage: If you think you have taken too much of this medicine contact a poison control center or emergency room at once. NOTE: This medicine is only for you. Do not share this medicine with others. What if I miss a dose? It is important not to miss your dose. Call your doctor or health care professional if you are unable to keep an appointment. What may interact with this medicine? Do not take this medicine with any of the following medications:  cisapride  dronedarone  pimozide  thioridazine This medicine may also interact with the following medications:  other medicines that prolong the QT interval (an abnormal  heart rhythm) This list may not describe all possible interactions. Give your health care provider a list of all the medicines, herbs, non-prescription drugs, or dietary supplements you use. Also tell them if you smoke, drink alcohol, or use illegal drugs. Some items may interact with your medicine. What should I watch for while using this medicine? Visit your doctor or health care provider for regular checks on your progress. Your symptoms may appear to get worse during the first weeks of this therapy. Tell your doctor or healthcare provider if your symptoms do not start to get better or if they get worse after this time. Your bones may get weaker if you take this medicine for a long time. If you smoke or frequently drink alcohol you may increase your risk of bone loss. A family history of osteoporosis, chronic use of drugs for seizures (convulsions), or corticosteroids can also increase your risk of bone loss. Talk to your doctor about how to keep your bones strong. This medicine should stop regular monthly menstruation in women. Tell your doctor if you continue to menstruate. Women should not become pregnant while taking this medicine or for 12 weeks after stopping this medicine. Women should inform their doctor if they wish to become pregnant or think they might be pregnant. There is a potential for serious side effects to an unborn child. Talk to your health care professional or pharmacist for more information. Do not breast-feed an infant while taking this medicine. Men should inform their doctors if they wish to father a child. This medicine may lower sperm counts. Talk  to your health care professional or pharmacist for more information. This medicine may increase blood sugar. Ask your healthcare provider if changes in diet or medicines are needed if you have diabetes. What side effects may I notice from receiving this medicine? Side effects that you should report to your doctor or health care  professional as soon as possible:  allergic reactions like skin rash, itching or hives, swelling of the face, lips, or tongue  bone pain  breathing problems  changes in vision  chest pain  feeling faint or lightheaded, falls  fever, chills  pain, swelling, warmth in the leg  pain, tingling, numbness in the hands or feet  signs and symptoms of high blood sugar such as being more thirsty or hungry or having to urinate more than normal. You may also feel very tired or have blurry vision  signs and symptoms of low blood pressure like dizziness; feeling faint or lightheaded, falls; unusually weak or tired  stomach pain  swelling of the ankles, feet, hands  trouble passing urine or change in the amount of urine  unusually high or low blood pressure  unusually weak or tired Side effects that usually do not require medical attention (report to your doctor or health care professional if they continue or are bothersome):  change in sex drive or performance  changes in breast size in both males and females  changes in emotions or moods  headache  hot flashes  irritation at site where injected  loss of appetite  skin problems like acne, dry skin  vaginal dryness This list may not describe all possible side effects. Call your doctor for medical advice about side effects. You may report side effects to FDA at 1-800-FDA-1088. Where should I keep my medicine? This drug is given in a hospital or clinic and will not be stored at home. NOTE: This sheet is a summary. It may not cover all possible information. If you have questions about this medicine, talk to your doctor, pharmacist, or health care provider.  2021 Elsevier/Gold Standard (2018-09-04 14:05:56) Denosumab injection What is this medicine? DENOSUMAB (den oh sue mab) slows bone breakdown. Prolia is used to treat osteoporosis in women after menopause and in men, and in people who are taking corticosteroids for 6  months or more. Delton See is used to treat a high calcium level due to cancer and to prevent bone fractures and other bone problems caused by multiple myeloma or cancer bone metastases. Delton See is also used to treat giant cell tumor of the bone. This medicine may be used for other purposes; ask your health care provider or pharmacist if you have questions. COMMON BRAND NAME(S): Prolia, XGEVA What should I tell my health care provider before I take this medicine? They need to know if you have any of these conditions:  dental disease  having surgery or tooth extraction  infection  kidney disease  low levels of calcium or Vitamin D in the blood  malnutrition  on hemodialysis  skin conditions or sensitivity  thyroid or parathyroid disease  an unusual reaction to denosumab, other medicines, foods, dyes, or preservatives  pregnant or trying to get pregnant  breast-feeding How should I use this medicine? This medicine is for injection under the skin. It is given by a health care professional in a hospital or clinic setting. A special MedGuide will be given to you before each treatment. Be sure to read this information carefully each time. For Prolia, talk to your pediatrician regarding the use  of this medicine in children. Special care may be needed. For Delton See, talk to your pediatrician regarding the use of this medicine in children. While this drug may be prescribed for children as young as 13 years for selected conditions, precautions do apply. Overdosage: If you think you have taken too much of this medicine contact a poison control center or emergency room at once. NOTE: This medicine is only for you. Do not share this medicine with others. What if I miss a dose? It is important not to miss your dose. Call your doctor or health care professional if you are unable to keep an appointment. What may interact with this medicine? Do not take this medicine with any of the following  medications:  other medicines containing denosumab This medicine may also interact with the following medications:  medicines that lower your chance of fighting infection  steroid medicines like prednisone or cortisone This list may not describe all possible interactions. Give your health care provider a list of all the medicines, herbs, non-prescription drugs, or dietary supplements you use. Also tell them if you smoke, drink alcohol, or use illegal drugs. Some items may interact with your medicine. What should I watch for while using this medicine? Visit your doctor or health care professional for regular checks on your progress. Your doctor or health care professional may order blood tests and other tests to see how you are doing. Call your doctor or health care professional for advice if you get a fever, chills or sore throat, or other symptoms of a cold or flu. Do not treat yourself. This drug may decrease your body's ability to fight infection. Try to avoid being around people who are sick. You should make sure you get enough calcium and vitamin D while you are taking this medicine, unless your doctor tells you not to. Discuss the foods you eat and the vitamins you take with your health care professional. See your dentist regularly. Brush and floss your teeth as directed. Before you have any dental work done, tell your dentist you are receiving this medicine. Do not become pregnant while taking this medicine or for 5 months after stopping it. Talk with your doctor or health care professional about your birth control options while taking this medicine. Women should inform their doctor if they wish to become pregnant or think they might be pregnant. There is a potential for serious side effects to an unborn child. Talk to your health care professional or pharmacist for more information. What side effects may I notice from receiving this medicine? Side effects that you should report to your doctor  or health care professional as soon as possible:  allergic reactions like skin rash, itching or hives, swelling of the face, lips, or tongue  bone pain  breathing problems  dizziness  jaw pain, especially after dental work  redness, blistering, peeling of the skin  signs and symptoms of infection like fever or chills; cough; sore throat; pain or trouble passing urine  signs of low calcium like fast heartbeat, muscle cramps or muscle pain; pain, tingling, numbness in the hands or feet; seizures  unusual bleeding or bruising  unusually weak or tired Side effects that usually do not require medical attention (report to your doctor or health care professional if they continue or are bothersome):  constipation  diarrhea  headache  joint pain  loss of appetite  muscle pain  runny nose  tiredness  upset stomach This list may not describe all possible side  effects. Call your doctor for medical advice about side effects. You may report side effects to FDA at 1-800-FDA-1088. Where should I keep my medicine? This medicine is only given in a clinic, doctor's office, or other health care setting and will not be stored at home. NOTE: This sheet is a summary. It may not cover all possible information. If you have questions about this medicine, talk to your doctor, pharmacist, or health care provider.  2021 Elsevier/Gold Standard (2017-09-23 16:10:44)

## 2020-08-28 NOTE — Progress Notes (Signed)
OK to give Xgeva with calcium 8.3 per Dr Jana Hakim

## 2020-08-29 ENCOUNTER — Telehealth: Payer: Self-pay | Admitting: Oncology

## 2020-08-29 NOTE — Telephone Encounter (Signed)
Scheduled per 3/31 los. Pt will receive an updated appt calendar per next appt notes

## 2020-09-25 ENCOUNTER — Inpatient Hospital Stay: Payer: Commercial Managed Care - PPO | Attending: Oncology

## 2020-09-25 ENCOUNTER — Inpatient Hospital Stay: Payer: Commercial Managed Care - PPO

## 2020-09-25 ENCOUNTER — Other Ambulatory Visit: Payer: Self-pay

## 2020-09-25 VITALS — BP 120/93 | HR 65 | Temp 97.9°F

## 2020-09-25 DIAGNOSIS — D5919 Other autoimmune hemolytic anemia: Secondary | ICD-10-CM

## 2020-09-25 DIAGNOSIS — C7951 Secondary malignant neoplasm of bone: Secondary | ICD-10-CM | POA: Insufficient documentation

## 2020-09-25 DIAGNOSIS — C7952 Secondary malignant neoplasm of bone marrow: Secondary | ICD-10-CM

## 2020-09-25 DIAGNOSIS — Z5111 Encounter for antineoplastic chemotherapy: Secondary | ICD-10-CM | POA: Diagnosis present

## 2020-09-25 DIAGNOSIS — C50411 Malignant neoplasm of upper-outer quadrant of right female breast: Secondary | ICD-10-CM | POA: Diagnosis not present

## 2020-09-25 DIAGNOSIS — E0500 Thyrotoxicosis with diffuse goiter without thyrotoxic crisis or storm: Secondary | ICD-10-CM

## 2020-09-25 DIAGNOSIS — Z170 Estrogen receptor positive status [ER+]: Secondary | ICD-10-CM | POA: Insufficient documentation

## 2020-09-25 DIAGNOSIS — C50811 Malignant neoplasm of overlapping sites of right female breast: Secondary | ICD-10-CM

## 2020-09-25 LAB — COMPREHENSIVE METABOLIC PANEL
ALT: 21 U/L (ref 0–44)
AST: 27 U/L (ref 15–41)
Albumin: 3.5 g/dL (ref 3.5–5.0)
Alkaline Phosphatase: 82 U/L (ref 38–126)
Anion gap: 10 (ref 5–15)
BUN: 12 mg/dL (ref 6–20)
CO2: 23 mmol/L (ref 22–32)
Calcium: 8.7 mg/dL — ABNORMAL LOW (ref 8.9–10.3)
Chloride: 107 mmol/L (ref 98–111)
Creatinine, Ser: 0.78 mg/dL (ref 0.44–1.00)
GFR, Estimated: >60 mL/min (ref >60)
Glucose, Bld: 109 mg/dL — ABNORMAL HIGH (ref 70–99)
Potassium: 4.1 mmol/L (ref 3.5–5.1)
Sodium: 140 mmol/L (ref 135–145)
Total Bilirubin: 0.3 mg/dL (ref 0.3–1.2)
Total Protein: 6.9 g/dL (ref 6.5–8.1)

## 2020-09-25 LAB — CBC WITH DIFFERENTIAL/PLATELET
Abs Immature Granulocytes: 0.01 10*3/uL (ref 0.00–0.07)
Basophils Absolute: 0 10*3/uL (ref 0.0–0.1)
Basophils Relative: 1 %
Eosinophils Absolute: 0.1 10*3/uL (ref 0.0–0.5)
Eosinophils Relative: 4 %
HCT: 30.6 % — ABNORMAL LOW (ref 36.0–46.0)
Hemoglobin: 9.7 g/dL — ABNORMAL LOW (ref 12.0–15.0)
Immature Granulocytes: 0 %
Lymphocytes Relative: 57 %
Lymphs Abs: 1.8 10*3/uL (ref 0.7–4.0)
MCH: 29.1 pg (ref 26.0–34.0)
MCHC: 31.7 g/dL (ref 30.0–36.0)
MCV: 91.9 fL (ref 80.0–100.0)
Monocytes Absolute: 0.2 10*3/uL (ref 0.1–1.0)
Monocytes Relative: 7 %
Neutro Abs: 1 10*3/uL — ABNORMAL LOW (ref 1.7–7.7)
Neutrophils Relative %: 31 %
Platelets: 213 10*3/uL (ref 150–400)
RBC: 3.33 MIL/uL — ABNORMAL LOW (ref 3.87–5.11)
RDW: 14.5 % (ref 11.5–15.5)
WBC: 3.2 10*3/uL — ABNORMAL LOW (ref 4.0–10.5)
nRBC: 0 % (ref 0.0–0.2)

## 2020-09-25 MED ORDER — GOSERELIN ACETATE 3.6 MG ~~LOC~~ IMPL
DRUG_IMPLANT | SUBCUTANEOUS | Status: AC
  Filled 2020-09-25: qty 3.6

## 2020-09-25 MED ORDER — DENOSUMAB 120 MG/1.7ML ~~LOC~~ SOLN
120.0000 mg | Freq: Once | SUBCUTANEOUS | Status: AC
  Administered 2020-09-25: 120 mg via SUBCUTANEOUS

## 2020-09-25 MED ORDER — DENOSUMAB 120 MG/1.7ML ~~LOC~~ SOLN
SUBCUTANEOUS | Status: AC
  Filled 2020-09-25: qty 1.7

## 2020-09-25 MED ORDER — GOSERELIN ACETATE 3.6 MG ~~LOC~~ IMPL
3.6000 mg | DRUG_IMPLANT | Freq: Once | SUBCUTANEOUS | Status: AC
  Administered 2020-09-25: 3.6 mg via SUBCUTANEOUS

## 2020-09-25 NOTE — Progress Notes (Signed)
Per desk nurse, Shawn Stall, ok to give xgeva today with calcium level of 8.7

## 2020-09-25 NOTE — Progress Notes (Unsigned)
Per Dr Jana Hakim ok to give xgeva with CA of 8.7. MD also wants pt to take 3 tums today. Information relayed to pt via L. Cunningham LPN.

## 2020-10-07 ENCOUNTER — Telehealth: Payer: Self-pay | Admitting: Oncology

## 2020-10-07 NOTE — Telephone Encounter (Signed)
R/s appts per 5/10 sch msg. Pt aware.  

## 2020-10-23 ENCOUNTER — Ambulatory Visit: Payer: Commercial Managed Care - PPO

## 2020-10-23 ENCOUNTER — Other Ambulatory Visit: Payer: Commercial Managed Care - PPO

## 2020-10-28 ENCOUNTER — Other Ambulatory Visit: Payer: Commercial Managed Care - PPO

## 2020-10-29 ENCOUNTER — Other Ambulatory Visit: Payer: Self-pay

## 2020-10-29 ENCOUNTER — Inpatient Hospital Stay: Payer: Commercial Managed Care - PPO | Attending: Oncology

## 2020-10-29 ENCOUNTER — Inpatient Hospital Stay: Payer: Commercial Managed Care - PPO

## 2020-10-29 VITALS — BP 120/84 | HR 74

## 2020-10-29 DIAGNOSIS — E0500 Thyrotoxicosis with diffuse goiter without thyrotoxic crisis or storm: Secondary | ICD-10-CM

## 2020-10-29 DIAGNOSIS — Z5111 Encounter for antineoplastic chemotherapy: Secondary | ICD-10-CM | POA: Diagnosis present

## 2020-10-29 DIAGNOSIS — C7951 Secondary malignant neoplasm of bone: Secondary | ICD-10-CM

## 2020-10-29 DIAGNOSIS — C50811 Malignant neoplasm of overlapping sites of right female breast: Secondary | ICD-10-CM

## 2020-10-29 DIAGNOSIS — Z79811 Long term (current) use of aromatase inhibitors: Secondary | ICD-10-CM | POA: Diagnosis not present

## 2020-10-29 DIAGNOSIS — Z170 Estrogen receptor positive status [ER+]: Secondary | ICD-10-CM | POA: Insufficient documentation

## 2020-10-29 DIAGNOSIS — D5919 Other autoimmune hemolytic anemia: Secondary | ICD-10-CM

## 2020-10-29 DIAGNOSIS — C50411 Malignant neoplasm of upper-outer quadrant of right female breast: Secondary | ICD-10-CM | POA: Diagnosis present

## 2020-10-29 DIAGNOSIS — C773 Secondary and unspecified malignant neoplasm of axilla and upper limb lymph nodes: Secondary | ICD-10-CM | POA: Diagnosis not present

## 2020-10-29 DIAGNOSIS — D6481 Anemia due to antineoplastic chemotherapy: Secondary | ICD-10-CM | POA: Insufficient documentation

## 2020-10-29 DIAGNOSIS — T451X5A Adverse effect of antineoplastic and immunosuppressive drugs, initial encounter: Secondary | ICD-10-CM | POA: Insufficient documentation

## 2020-10-29 LAB — CBC WITH DIFFERENTIAL/PLATELET
Abs Immature Granulocytes: 0.01 10*3/uL (ref 0.00–0.07)
Basophils Absolute: 0 10*3/uL (ref 0.0–0.1)
Basophils Relative: 0 %
Eosinophils Absolute: 0 10*3/uL (ref 0.0–0.5)
Eosinophils Relative: 1 %
HCT: 26.1 % — ABNORMAL LOW (ref 36.0–46.0)
Hemoglobin: 8.3 g/dL — ABNORMAL LOW (ref 12.0–15.0)
Immature Granulocytes: 0 %
Lymphocytes Relative: 56 %
Lymphs Abs: 1.5 10*3/uL (ref 0.7–4.0)
MCH: 28.8 pg (ref 26.0–34.0)
MCHC: 31.8 g/dL (ref 30.0–36.0)
MCV: 90.6 fL (ref 80.0–100.0)
Monocytes Absolute: 0.3 10*3/uL (ref 0.1–1.0)
Monocytes Relative: 9 %
Neutro Abs: 0.9 10*3/uL — ABNORMAL LOW (ref 1.7–7.7)
Neutrophils Relative %: 34 %
Platelets: 139 10*3/uL — ABNORMAL LOW (ref 150–400)
RBC: 2.88 MIL/uL — ABNORMAL LOW (ref 3.87–5.11)
RDW: 15.3 % (ref 11.5–15.5)
WBC: 2.8 10*3/uL — ABNORMAL LOW (ref 4.0–10.5)
nRBC: 0.7 % — ABNORMAL HIGH (ref 0.0–0.2)

## 2020-10-29 LAB — COMPREHENSIVE METABOLIC PANEL
ALT: 29 U/L (ref 0–44)
AST: 33 U/L (ref 15–41)
Albumin: 3.5 g/dL (ref 3.5–5.0)
Alkaline Phosphatase: 99 U/L (ref 38–126)
Anion gap: 11 (ref 5–15)
BUN: 14 mg/dL (ref 6–20)
CO2: 24 mmol/L (ref 22–32)
Calcium: 9.3 mg/dL (ref 8.9–10.3)
Chloride: 106 mmol/L (ref 98–111)
Creatinine, Ser: 0.74 mg/dL (ref 0.44–1.00)
GFR, Estimated: >60 mL/min (ref >60)
Glucose, Bld: 107 mg/dL — ABNORMAL HIGH (ref 70–99)
Potassium: 3.8 mmol/L (ref 3.5–5.1)
Sodium: 141 mmol/L (ref 135–145)
Total Bilirubin: 0.3 mg/dL (ref 0.3–1.2)
Total Protein: 7.4 g/dL (ref 6.5–8.1)

## 2020-10-29 MED ORDER — GOSERELIN ACETATE 3.6 MG ~~LOC~~ IMPL
3.6000 mg | DRUG_IMPLANT | Freq: Once | SUBCUTANEOUS | Status: AC
  Administered 2020-10-29: 3.6 mg via SUBCUTANEOUS

## 2020-10-29 MED ORDER — DENOSUMAB 120 MG/1.7ML ~~LOC~~ SOLN
SUBCUTANEOUS | Status: AC
  Filled 2020-10-29: qty 1.7

## 2020-10-29 MED ORDER — DENOSUMAB 120 MG/1.7ML ~~LOC~~ SOLN
120.0000 mg | Freq: Once | SUBCUTANEOUS | Status: AC
  Administered 2020-10-29: 120 mg via SUBCUTANEOUS

## 2020-10-29 MED ORDER — GOSERELIN ACETATE 3.6 MG ~~LOC~~ IMPL
DRUG_IMPLANT | SUBCUTANEOUS | Status: AC
  Filled 2020-10-29: qty 3.6

## 2020-11-11 ENCOUNTER — Inpatient Hospital Stay: Payer: Commercial Managed Care - PPO

## 2020-11-11 ENCOUNTER — Ambulatory Visit (HOSPITAL_COMMUNITY): Payer: Commercial Managed Care - PPO

## 2020-11-11 ENCOUNTER — Other Ambulatory Visit: Payer: Self-pay | Admitting: *Deleted

## 2020-11-11 ENCOUNTER — Other Ambulatory Visit (HOSPITAL_COMMUNITY): Payer: Commercial Managed Care - PPO

## 2020-11-11 ENCOUNTER — Other Ambulatory Visit: Payer: Self-pay

## 2020-11-11 DIAGNOSIS — C50811 Malignant neoplasm of overlapping sites of right female breast: Secondary | ICD-10-CM

## 2020-11-11 DIAGNOSIS — Z5111 Encounter for antineoplastic chemotherapy: Secondary | ICD-10-CM | POA: Diagnosis not present

## 2020-11-11 LAB — CBC WITH DIFFERENTIAL (CANCER CENTER ONLY)
Abs Immature Granulocytes: 0.02 10*3/uL (ref 0.00–0.07)
Basophils Absolute: 0 10*3/uL (ref 0.0–0.1)
Basophils Relative: 0 %
Eosinophils Absolute: 0.1 10*3/uL (ref 0.0–0.5)
Eosinophils Relative: 4 %
HCT: 24.6 % — ABNORMAL LOW (ref 36.0–46.0)
Hemoglobin: 7.8 g/dL — ABNORMAL LOW (ref 12.0–15.0)
Immature Granulocytes: 1 %
Lymphocytes Relative: 54 %
Lymphs Abs: 1.7 10*3/uL (ref 0.7–4.0)
MCH: 28.8 pg (ref 26.0–34.0)
MCHC: 31.7 g/dL (ref 30.0–36.0)
MCV: 90.8 fL (ref 80.0–100.0)
Monocytes Absolute: 0.1 10*3/uL (ref 0.1–1.0)
Monocytes Relative: 5 %
Neutro Abs: 1.1 10*3/uL — ABNORMAL LOW (ref 1.7–7.7)
Neutrophils Relative %: 36 %
Platelet Count: 190 10*3/uL (ref 150–400)
RBC: 2.71 MIL/uL — ABNORMAL LOW (ref 3.87–5.11)
RDW: 15.9 % — ABNORMAL HIGH (ref 11.5–15.5)
WBC Count: 3.1 10*3/uL — ABNORMAL LOW (ref 4.0–10.5)
nRBC: 0.6 % — ABNORMAL HIGH (ref 0.0–0.2)

## 2020-11-11 LAB — CMP (CANCER CENTER ONLY)
ALT: 33 U/L (ref 0–44)
AST: 38 U/L (ref 15–41)
Albumin: 3.6 g/dL (ref 3.5–5.0)
Alkaline Phosphatase: 106 U/L (ref 38–126)
Anion gap: 10 (ref 5–15)
BUN: 12 mg/dL (ref 6–20)
CO2: 22 mmol/L (ref 22–32)
Calcium: 8.8 mg/dL — ABNORMAL LOW (ref 8.9–10.3)
Chloride: 107 mmol/L (ref 98–111)
Creatinine: 0.93 mg/dL (ref 0.44–1.00)
GFR, Estimated: >60 mL/min (ref >60)
Glucose, Bld: 86 mg/dL (ref 70–99)
Potassium: 4 mmol/L (ref 3.5–5.1)
Sodium: 139 mmol/L (ref 135–145)
Total Bilirubin: 0.3 mg/dL (ref 0.3–1.2)
Total Protein: 7.6 g/dL (ref 6.5–8.1)

## 2020-11-11 LAB — SAMPLE TO BLOOD BANK

## 2020-11-12 ENCOUNTER — Encounter: Payer: Self-pay | Admitting: Oncology

## 2020-11-12 ENCOUNTER — Ambulatory Visit
Admission: RE | Admit: 2020-11-12 | Discharge: 2020-11-12 | Disposition: A | Discharge status: Home / Self Care | Payer: Commercial Managed Care - PPO | Source: Ambulatory Visit | Attending: Oncology | Admitting: Oncology

## 2020-11-12 ENCOUNTER — Other Ambulatory Visit: Payer: Self-pay | Admitting: Oncology

## 2020-11-12 DIAGNOSIS — C50411 Malignant neoplasm of upper-outer quadrant of right female breast: Secondary | ICD-10-CM

## 2020-11-12 DIAGNOSIS — Z170 Estrogen receptor positive status [ER+]: Secondary | ICD-10-CM

## 2020-11-12 DIAGNOSIS — C7951 Secondary malignant neoplasm of bone: Secondary | ICD-10-CM

## 2020-11-12 IMAGING — US US BREAST*R* LIMITED INC AXILLA
1 series · 11 of 11 positions shown · non-contrast
Comparison: Previous exam(s).

CLINICAL DATA: Stage IV breast cancer in the right breast, treated
with oral chemotherapy. Follow-up calcifications and mass.

EXAM:
DIGITAL DIAGNOSTIC UNILATERAL RIGHT MAMMOGRAM WITH TOMOSYNTHESIS AND
CAD; ULTRASOUND RIGHT BREAST LIMITED
TECHNIQUE: Right digital diagnostic mammography and breast tomosynthesis was
performed. The images were evaluated with computer-aided detection.;
Targeted ultrasound examination of the right breast was performed

[Series 1: us breast*right* limited inc axilla · 0.07mm/px · 11 of 11 slices shown]
[im 1/11]
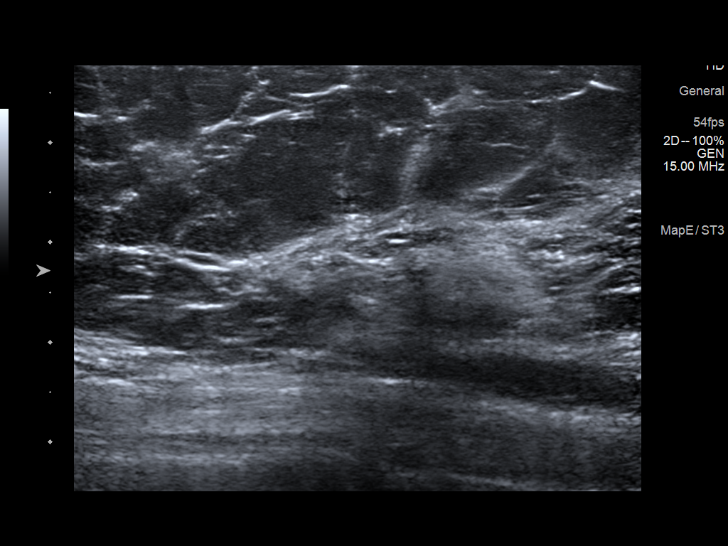
[im 2/11]
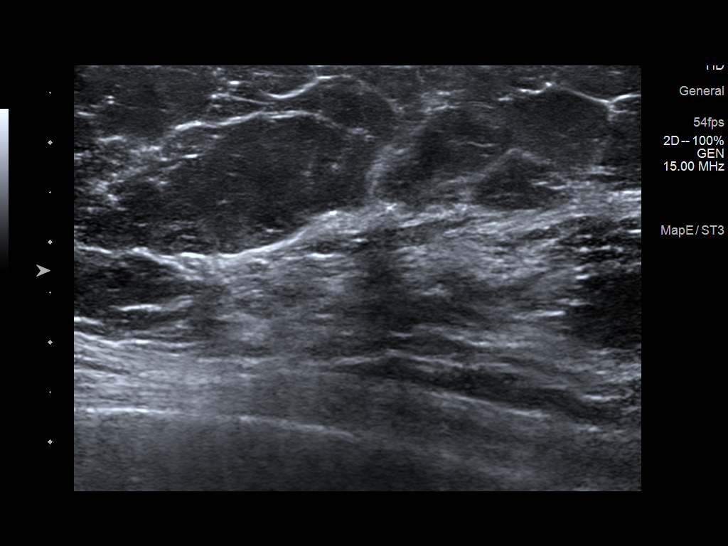
[im 3/11]
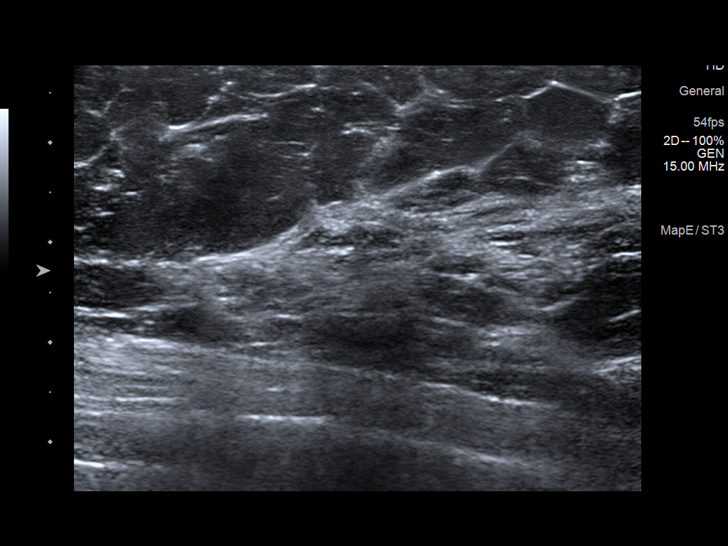
[im 4/11]
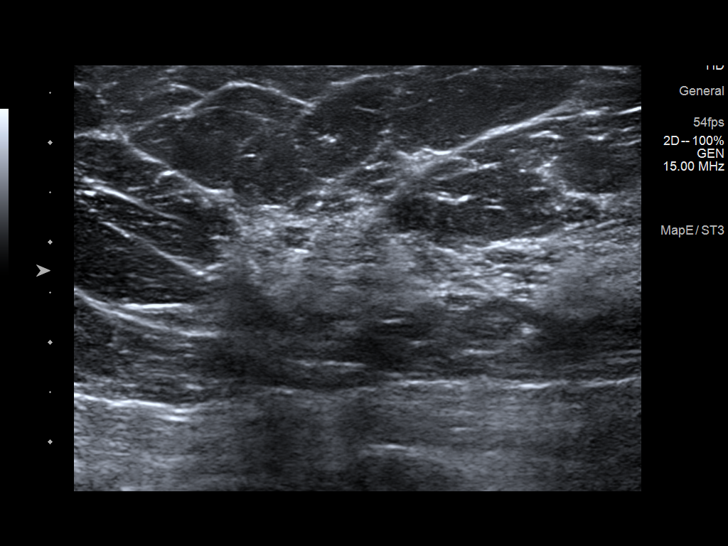
[im 5/11]
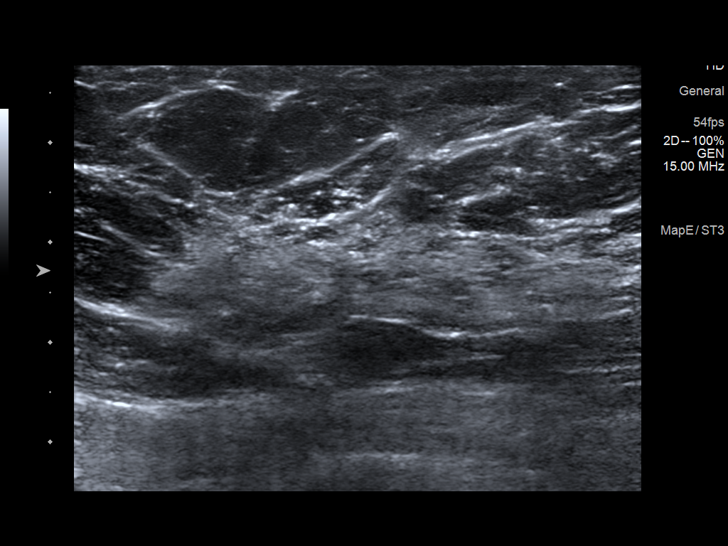
[im 6/11]
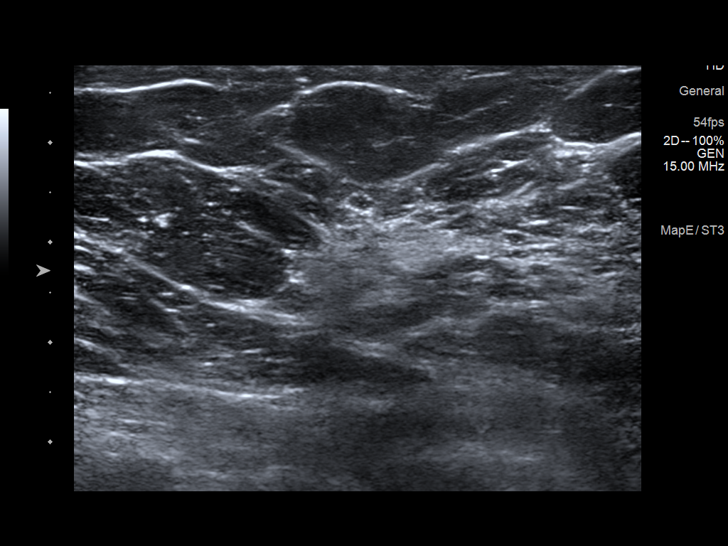
[im 7/11]
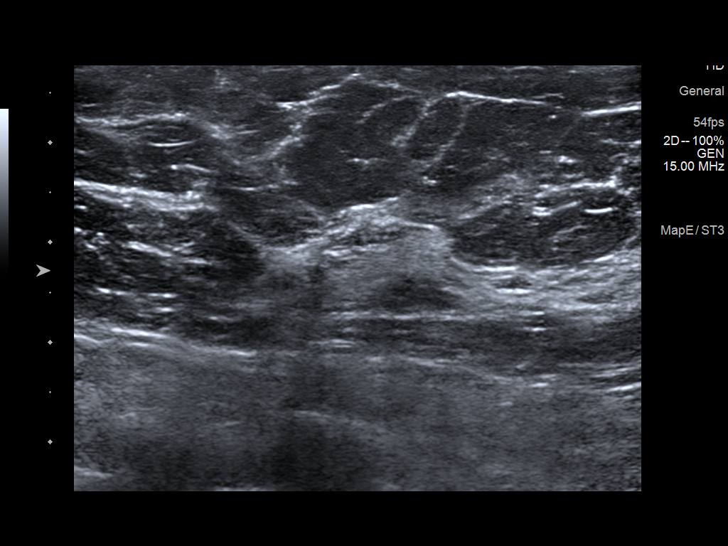
[im 8/11]
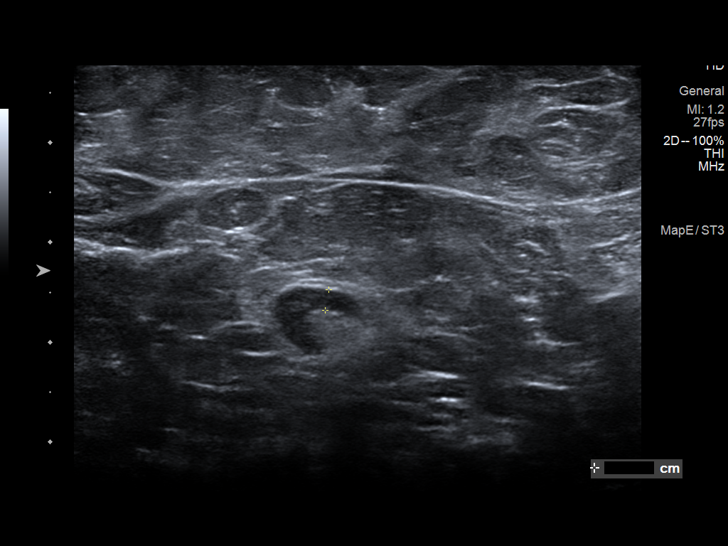
[im 9/11]
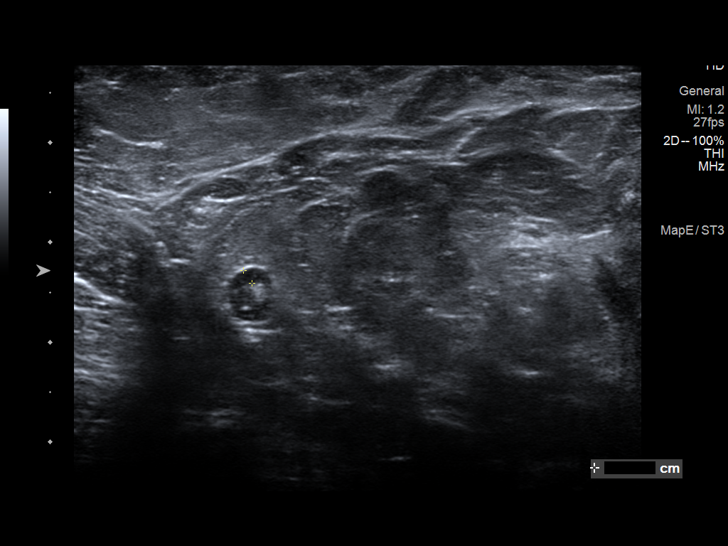
[im 10/11]
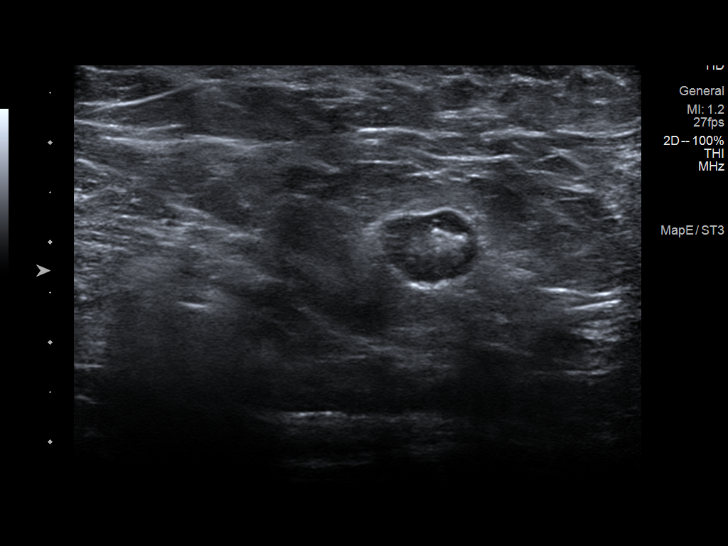
[im 11/11]
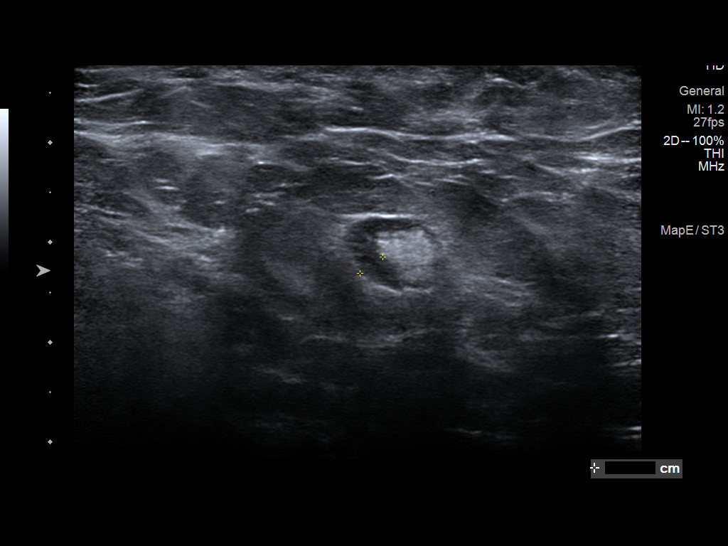

[11 of 11 positions shown; findings below may reference images not displayed]

ACR Breast Density Category b: There are scattered areas of
fibroglandular density.
FINDINGS: There has been a mild increase in number of pleomorphic
calcifications in the upper outer right breast without significant
change in size or distribution of the area of calcifications since
[DATE]. No interval findings elsewhere in the breast suspicious
for malignancy.

Targeted ultrasound is performed, showing no residual mass at the
location of the previously demonstrated small residual mass in the
10 o'clock position of the right breast, 8 cm from the nipple.

The right axillary lymph nodes currently have a normal appearance
with a maximum cortical thickness of 2.8 mm.
IMPRESSION: 1. Sonographically resolved mass in the 10 o'clock position of the
right breast.
2. Sonographically resolved right axillary adenopathy.
3. Slight increase in number of pleomorphic calcifications in the
upper-outer right breast without significant change in size or
distribution of the area of calcifications.

RECOMMENDATION:
1. Treatment plan.
2. Bilateral diagnostic mammogram in 6 months. That will be 1 year
since mammographic evaluation of the left breast.

I have discussed the findings and recommendations with the patient.
If applicable, a reminder letter will be sent to the patient
regarding the next appointment.

BI-RADS CATEGORY  6: Known biopsy-proven malignancy.

## 2020-11-12 IMAGING — MG MM DIGITAL DIAGNOSTIC UNILAT*R* W/ TOMO W/ CAD
6 series · 6 of 14 positions shown · non-contrast
Comparison: Previous exam(s).

CLINICAL DATA: Stage IV breast cancer in the right breast, treated
with oral chemotherapy. Follow-up calcifications and mass.

EXAM:
DIGITAL DIAGNOSTIC UNILATERAL RIGHT MAMMOGRAM WITH TOMOSYNTHESIS AND
CAD; ULTRASOUND RIGHT BREAST LIMITED
TECHNIQUE: Right digital diagnostic mammography and breast tomosynthesis was
performed. The images were evaluated with computer-aided detection.;
Targeted ultrasound examination of the right breast was performed

[R CC]
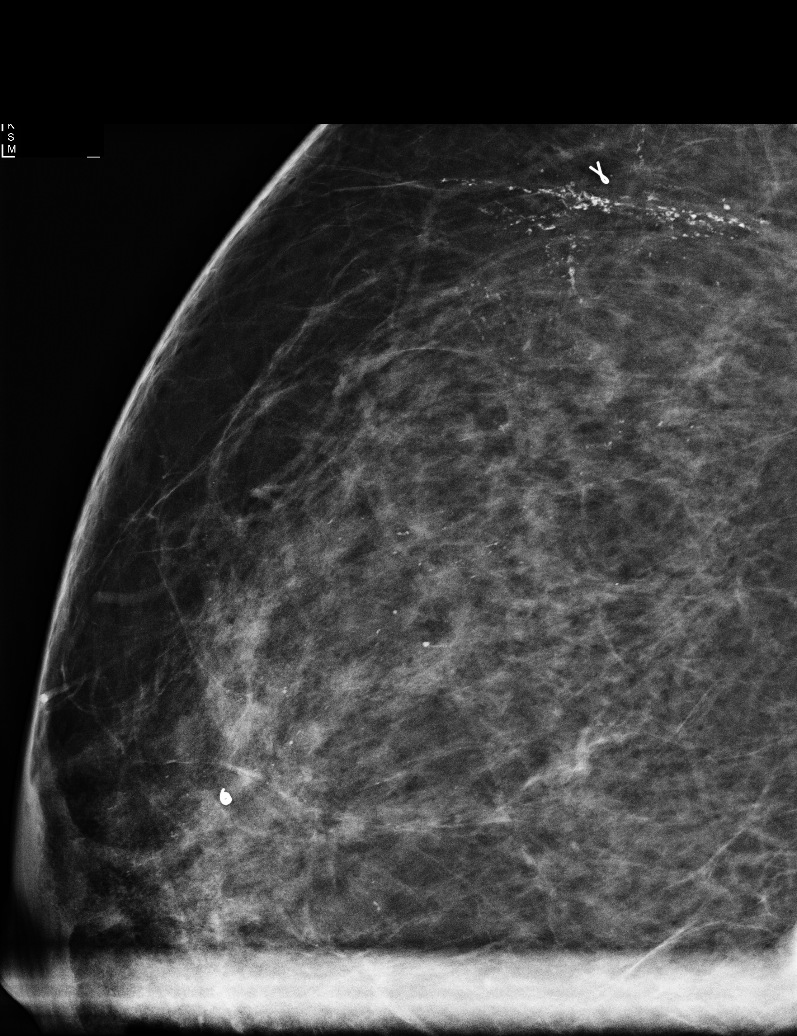

[R ML]
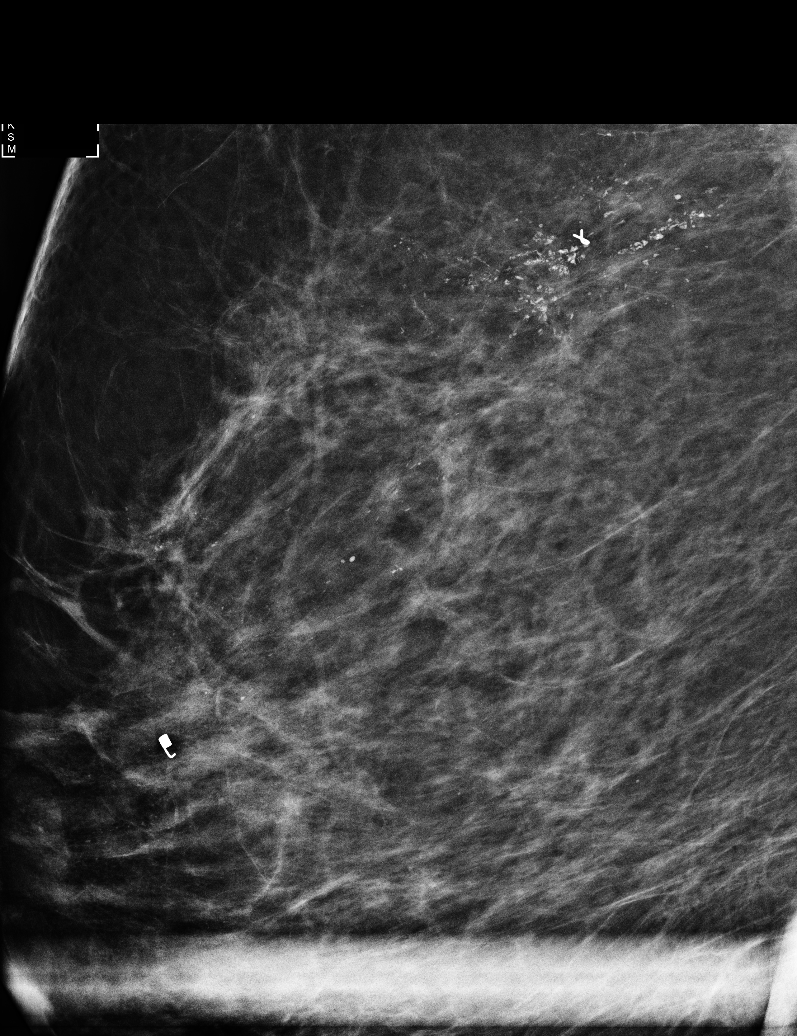

[R MLO synth-2D]
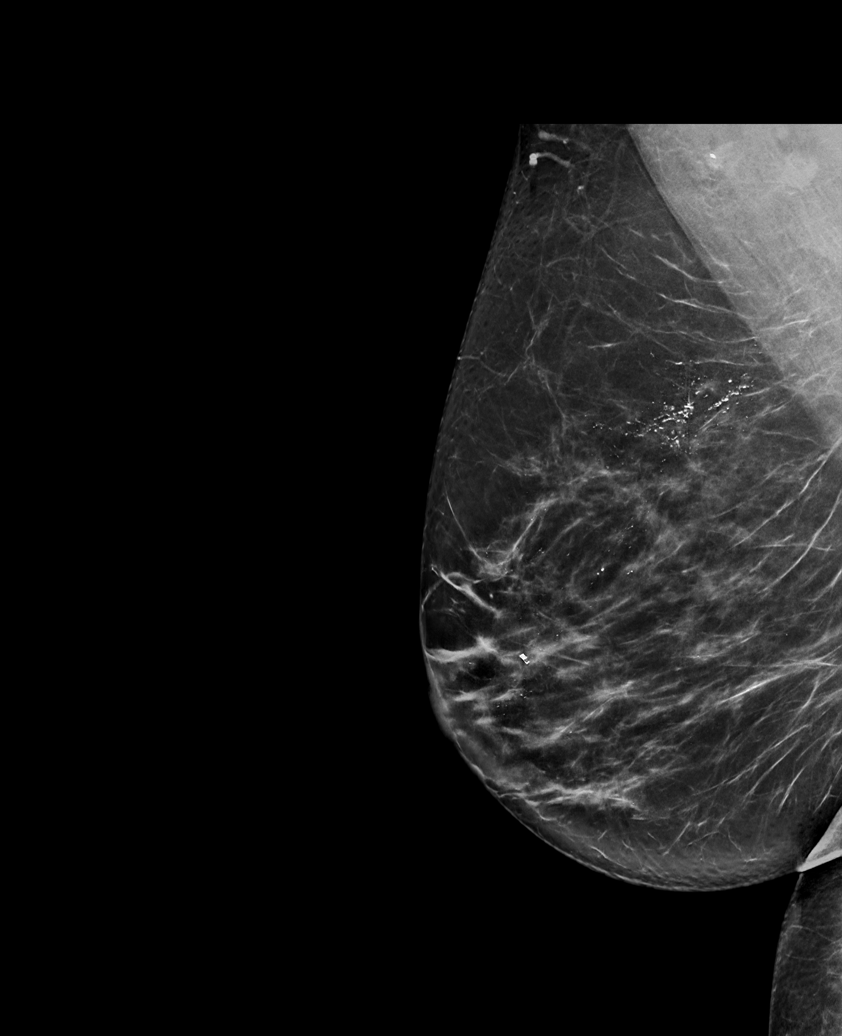

[R CC synth-2D]
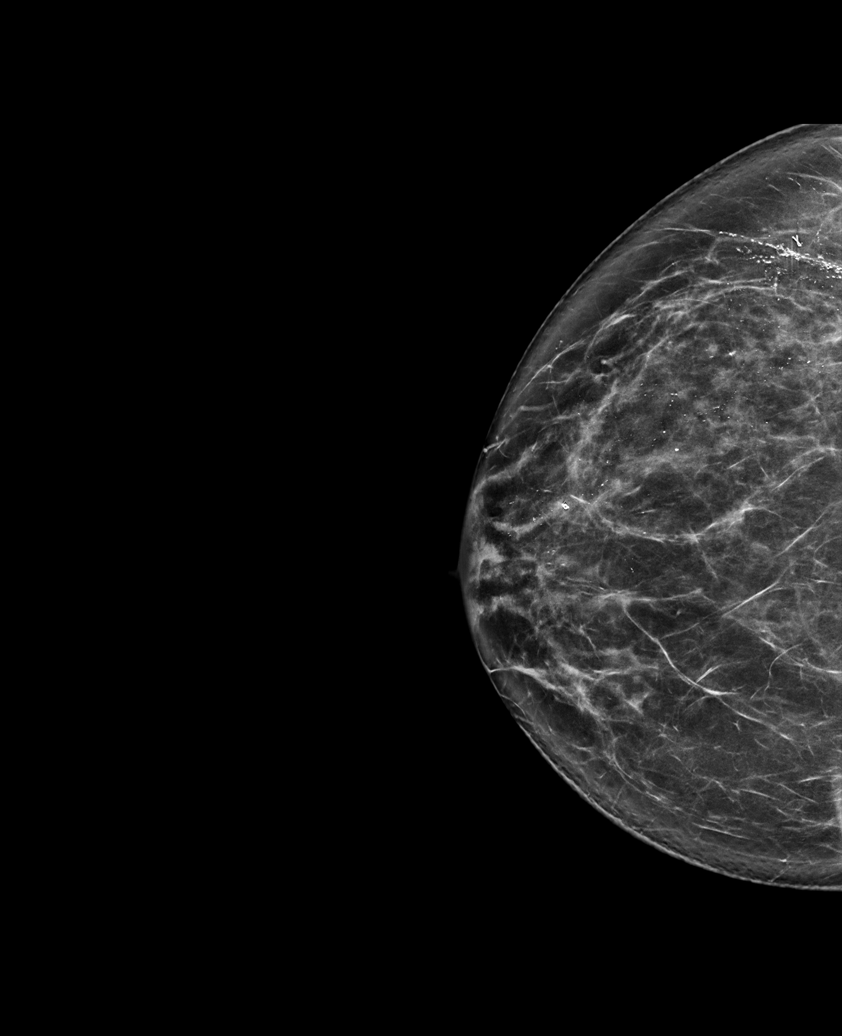

[R MLO tomo · tomo slice 44/87.0]
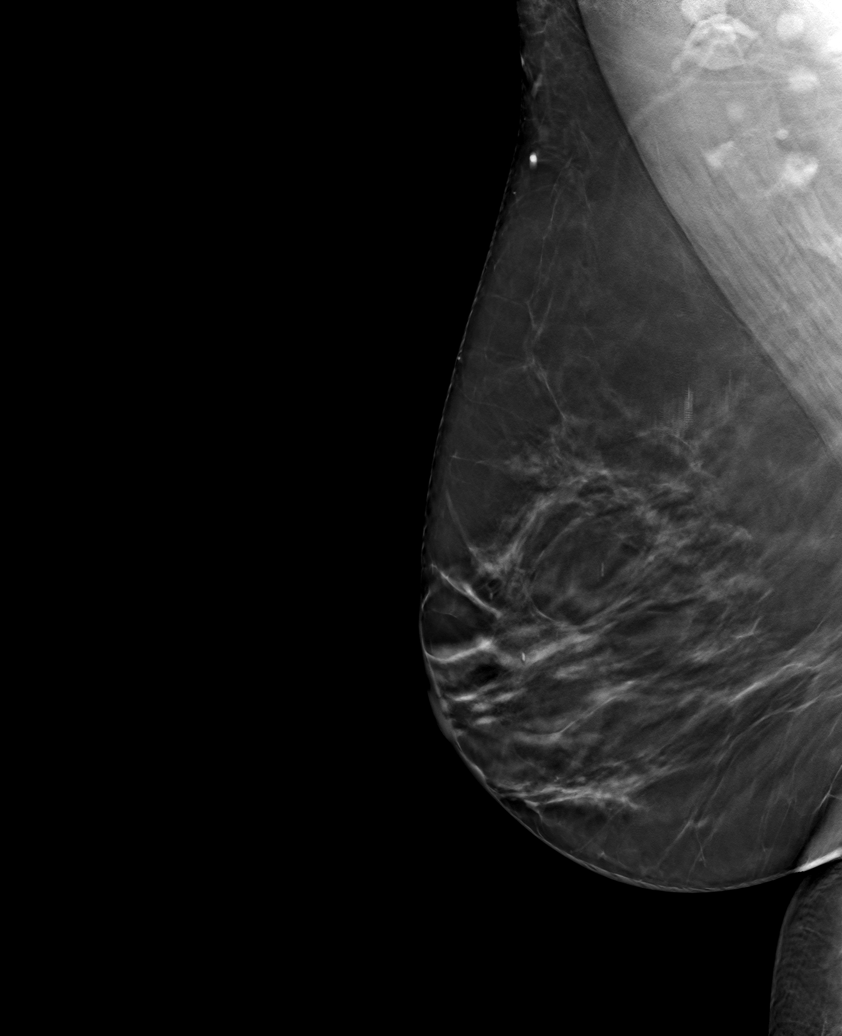

[R CC tomo · tomo slice 41/82.0]
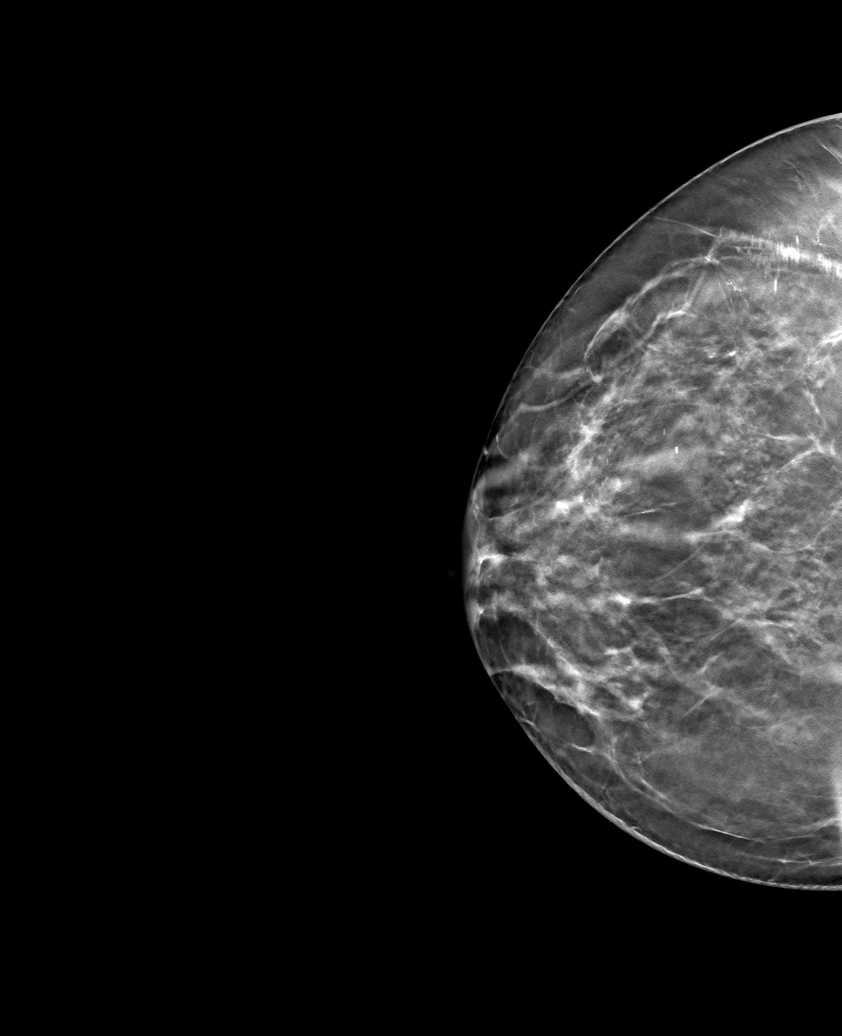

[6 of 14 positions shown; findings below may reference images not displayed]

ACR Breast Density Category b: There are scattered areas of
fibroglandular density.
FINDINGS: There has been a mild increase in number of pleomorphic
calcifications in the upper outer right breast without significant
change in size or distribution of the area of calcifications since
[DATE]. No interval findings elsewhere in the breast suspicious
for malignancy.

Targeted ultrasound is performed, showing no residual mass at the
location of the previously demonstrated small residual mass in the
10 o'clock position of the right breast, 8 cm from the nipple.

The right axillary lymph nodes currently have a normal appearance
with a maximum cortical thickness of 2.8 mm.
IMPRESSION: 1. Sonographically resolved mass in the 10 o'clock position of the
right breast.
2. Sonographically resolved right axillary adenopathy.
3. Slight increase in number of pleomorphic calcifications in the
upper-outer right breast without significant change in size or
distribution of the area of calcifications.

RECOMMENDATION:
1. Treatment plan.
2. Bilateral diagnostic mammogram in 6 months. That will be 1 year
since mammographic evaluation of the left breast.

I have discussed the findings and recommendations with the patient.
If applicable, a reminder letter will be sent to the patient
regarding the next appointment.

BI-RADS CATEGORY  6: Known biopsy-proven malignancy.

## 2020-11-17 ENCOUNTER — Other Ambulatory Visit: Payer: Self-pay | Admitting: Oncology

## 2020-11-19 ENCOUNTER — Telehealth: Payer: Self-pay | Admitting: *Deleted

## 2020-11-19 ENCOUNTER — Other Ambulatory Visit: Payer: Self-pay | Admitting: *Deleted

## 2020-11-19 ENCOUNTER — Encounter: Payer: Self-pay | Admitting: *Deleted

## 2020-11-19 NOTE — Telephone Encounter (Signed)
This RN spoke with pt post her call stating increased fatigue- with lab draw showing hemoglobin of 7.3.  Pt states she would prefer not to get blood transfusions if avoidable  [place] verified she has not had a menstrual cycle since January 2020 when goserelin was initiated.  Per review of labs and that patient's symptoms of increased fatigue interfering with her activities of daily living recommendation and order placed for aranesp for anemia secondary to chemotherapy.  Pt is a stage 4 breast cancer with current therapy :letrozole, palbociclib, goserelin, denosumab.  Request for authorization is being processed.  Diagnosis codes chemo induced anemia  D64.81 Fatigue R53.0

## 2020-11-20 ENCOUNTER — Other Ambulatory Visit: Payer: Self-pay | Admitting: Oncology

## 2020-11-20 ENCOUNTER — Other Ambulatory Visit: Payer: Self-pay | Admitting: *Deleted

## 2020-11-20 ENCOUNTER — Inpatient Hospital Stay: Payer: Commercial Managed Care - PPO

## 2020-11-20 ENCOUNTER — Ambulatory Visit: Payer: Commercial Managed Care - PPO | Admitting: Oncology

## 2020-11-20 ENCOUNTER — Ambulatory Visit (HOSPITAL_COMMUNITY)
Admission: RE | Admit: 2020-11-20 | Discharge: 2020-11-20 | Disposition: A | Discharge status: Home / Self Care | Payer: Commercial Managed Care - PPO | Source: Ambulatory Visit | Attending: Oncology | Admitting: Oncology

## 2020-11-20 ENCOUNTER — Ambulatory Visit: Payer: Commercial Managed Care - PPO

## 2020-11-20 ENCOUNTER — Other Ambulatory Visit: Payer: Self-pay

## 2020-11-20 ENCOUNTER — Other Ambulatory Visit: Payer: Commercial Managed Care - PPO

## 2020-11-20 DIAGNOSIS — D582 Other hemoglobinopathies: Secondary | ICD-10-CM

## 2020-11-20 DIAGNOSIS — C50411 Malignant neoplasm of upper-outer quadrant of right female breast: Secondary | ICD-10-CM

## 2020-11-20 DIAGNOSIS — Z170 Estrogen receptor positive status [ER+]: Secondary | ICD-10-CM

## 2020-11-20 DIAGNOSIS — C7951 Secondary malignant neoplasm of bone: Secondary | ICD-10-CM

## 2020-11-20 DIAGNOSIS — Z5111 Encounter for antineoplastic chemotherapy: Secondary | ICD-10-CM | POA: Diagnosis not present

## 2020-11-20 DIAGNOSIS — D649 Anemia, unspecified: Secondary | ICD-10-CM

## 2020-11-20 DIAGNOSIS — C7952 Secondary malignant neoplasm of bone marrow: Secondary | ICD-10-CM | POA: Insufficient documentation

## 2020-11-20 LAB — CBC WITH DIFFERENTIAL (CANCER CENTER ONLY)
Abs Immature Granulocytes: 0.01 10*3/uL (ref 0.00–0.07)
Basophils Absolute: 0 10*3/uL (ref 0.0–0.1)
Basophils Relative: 0 %
Eosinophils Absolute: 0.1 10*3/uL (ref 0.0–0.5)
Eosinophils Relative: 3 %
HCT: 21.7 % — ABNORMAL LOW (ref 36.0–46.0)
Hemoglobin: 7 g/dL — ABNORMAL LOW (ref 12.0–15.0)
Immature Granulocytes: 0 %
Lymphocytes Relative: 60 %
Lymphs Abs: 1.7 10*3/uL (ref 0.7–4.0)
MCH: 28.6 pg (ref 26.0–34.0)
MCHC: 32.3 g/dL (ref 30.0–36.0)
MCV: 88.6 fL (ref 80.0–100.0)
Monocytes Absolute: 0.2 10*3/uL (ref 0.1–1.0)
Monocytes Relative: 8 %
Neutro Abs: 0.8 10*3/uL — ABNORMAL LOW (ref 1.7–7.7)
Neutrophils Relative %: 29 %
Platelet Count: 123 10*3/uL — ABNORMAL LOW (ref 150–400)
RBC: 2.45 MIL/uL — ABNORMAL LOW (ref 3.87–5.11)
RDW: 16.2 % — ABNORMAL HIGH (ref 11.5–15.5)
WBC Count: 2.8 10*3/uL — ABNORMAL LOW (ref 4.0–10.5)
nRBC: 0.7 % — ABNORMAL HIGH (ref 0.0–0.2)

## 2020-11-20 LAB — ABO/RH: ABO/RH(D): A POS

## 2020-11-20 IMAGING — NM NM BONE WHOLE BODY
2 series · 2 of 2 positions shown · non-contrast
Comparison: Bone scan [DATE], CT chest [DATE]

CLINICAL DATA: Breast cancer.  Prior osseous metastasis.

EXAM:
NUCLEAR MEDICINE WHOLE BODY BONE SCAN
TECHNIQUE: Whole body anterior and posterior images were obtained approximately
3 hours after intravenous injection of radiopharmaceutical.
RADIOPHARMACEUTICALS:  20.2 mCi [XO] MDP IV

[Series 1: wbr_bone_40 whole body · 2.66mm/px · 1 of 1 slices shown (1 of 2)]
[im 1/1]
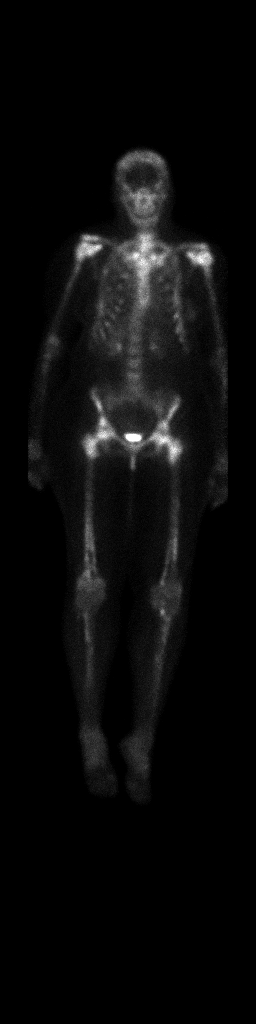

[Series 1: wbr_bone_40 whole body · 2.66mm/px · 1 of 1 slices shown (2 of 2)]
[im 1/1]
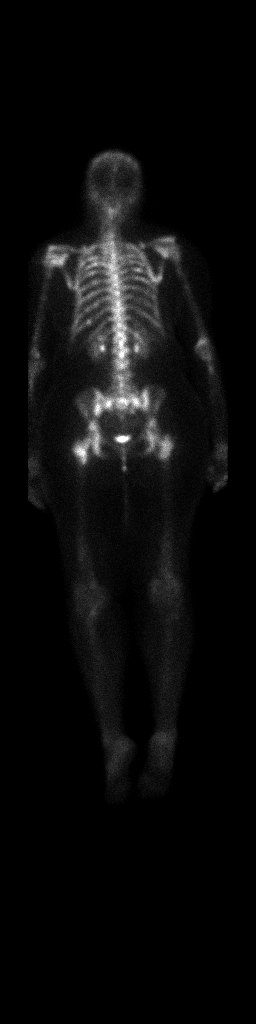

[2 of 2 positions shown; findings below may reference images not displayed]

FINDINGS: Again demonstrated multifocal skeletal metastasis. Lesionin the
posterior ribs, posterior sacrum and medial iliac bones as well as
proximal femoral heads are more distinct than comparison exam
however this may relate to technique. No clear evidence of
progression. No evidence of pathologic fracture.

Activity in the long bones of the lower extremity appear
increased/more well-defined.
IMPRESSION: 1. No evidence of new metastatic skeletal lesions.
2. Previously noted metastatic skeletal lesions in the posterior
ribs, bony pelvis, proximal femurs are similar but more defined
which may relate to technique.
3. Uptake within the femurs and tibia are more defined than prior
which again may relate to technique.

## 2020-11-20 MED ORDER — TECHNETIUM TC 99M MEDRONATE IV KIT
20.0000 | PACK | Freq: Once | INTRAVENOUS | Status: AC | PRN
  Administered 2020-11-20: 20.2 via INTRAVENOUS

## 2020-11-20 MED ORDER — PALBOCICLIB 75 MG PO CAPS
75.0000 mg | ORAL_CAPSULE | Freq: Every day | ORAL | 6 refills | Status: DC
  Filled 2020-11-20: qty 21, 21d supply, fill #0

## 2020-11-20 NOTE — Progress Notes (Signed)
I called [name] [name] given her neutropenia I am dropping the dose of Ibrance to 75 mg beginning when she starts her next cycle.  She will be coming in a couple of days for blood transfusion.  She knows to call us if she develops a fever.  She is to starting her "off" week at this point

## 2020-11-21 ENCOUNTER — Other Ambulatory Visit (HOSPITAL_COMMUNITY): Payer: Self-pay

## 2020-11-21 ENCOUNTER — Other Ambulatory Visit: Payer: Self-pay | Admitting: Pharmacist

## 2020-11-21 DIAGNOSIS — Z170 Estrogen receptor positive status [ER+]: Secondary | ICD-10-CM

## 2020-11-21 DIAGNOSIS — C50411 Malignant neoplasm of upper-outer quadrant of right female breast: Secondary | ICD-10-CM

## 2020-11-21 MED ORDER — PALBOCICLIB 75 MG PO CAPS: 75.0000 mg | ORAL_CAPSULE | Freq: Every day | ORAL | 6 refills | Status: DC

## 2020-11-21 NOTE — Progress Notes (Signed)
Oral Oncology Pharmacist Encounter  Prescription refill for Ibrance (palbociclib) sent to [facility] in error. Patient enrolled in manufacturer assistance and receives medication through [employer]. Prescription redirected to [facility] (contracted with [employer]).  Leron Croak, PharmD, BCPS Hematology/Oncology Clinical Pharmacist [facility] [phone] 11/21/2020 8:20 AM

## 2020-11-22 ENCOUNTER — Other Ambulatory Visit: Payer: Self-pay

## 2020-11-22 ENCOUNTER — Inpatient Hospital Stay: Payer: Commercial Managed Care - PPO

## 2020-11-22 DIAGNOSIS — Z5111 Encounter for antineoplastic chemotherapy: Secondary | ICD-10-CM | POA: Diagnosis not present

## 2020-11-22 DIAGNOSIS — D649 Anemia, unspecified: Secondary | ICD-10-CM

## 2020-11-22 LAB — PREPARE RBC (CROSSMATCH)

## 2020-11-22 MED ORDER — ACETAMINOPHEN 325 MG PO TABS
650.0000 mg | ORAL_TABLET | Freq: Once | ORAL | Status: AC
  Administered 2020-11-22: 650 mg via ORAL

## 2020-11-22 MED ORDER — ACETAMINOPHEN 325 MG PO TABS
ORAL_TABLET | ORAL | Status: AC
  Filled 2020-11-22: qty 2

## 2020-11-22 MED ORDER — DIPHENHYDRAMINE HCL 25 MG PO CAPS
ORAL_CAPSULE | ORAL | Status: AC
  Filled 2020-11-22: qty 1

## 2020-11-22 MED ORDER — DIPHENHYDRAMINE HCL 25 MG PO CAPS
25.0000 mg | ORAL_CAPSULE | Freq: Once | ORAL | Status: AC
  Administered 2020-11-22: 25 mg via ORAL

## 2020-11-22 MED ORDER — SODIUM CHLORIDE 0.9% IV SOLUTION
250.0000 mL | Freq: Once | INTRAVENOUS | Status: AC
  Administered 2020-11-22: 250 mL via INTRAVENOUS
  Filled 2020-11-22: qty 250

## 2020-11-22 NOTE — Progress Notes (Signed)
Today patient received a total of 2 units of PRBC as ordered verbally by Dr. Julien Nordmann (on call that day). I placed orders for 2 units but was only able to release 1 in the flowsheets so I placed an additional order for 1 unit so I could release and chart on it. Again, 2 units received from blood bank and 2 units transfused.

## 2020-11-22 NOTE — Patient Instructions (Signed)
https://www.[facility].org/donate-blood/blood-donation-process/what-happens-to-donated-blood/blood-transfusions/types-of-blood-transfusions.html"> https://www.[facility].org/donate-blood/blood-donation-process/what-happens-to-donated-blood/blood-transfusions/risks-complications.html">  Blood Transfusion, Adult, Care After This sheet gives you information about how to care for yourself after your procedure. Your health care provider may also give you more specific instructions. If you have problems or questions, contact your health careprovider. What can I expect after the procedure? After the procedure, it is common to have: Bruising and soreness where the IV was inserted. A fever or chills on the day of the procedure. This may be your body's response to the new blood cells received. A headache. Follow these instructions at home: IV insertion site care     Follow instructions from your health care provider about how to take care of your IV insertion site. Make sure you: Wash your hands with soap and water before and after you change your bandage (dressing). If soap and water are not available, use hand sanitizer. Change your dressing as told by your health care provider. Check your IV insertion site every day for signs of infection. Check for: Redness, swelling, or pain. Bleeding from the site. Warmth. Pus or a bad smell. General instructions Take over-the-counter and prescription medicines only as told by your health care provider. Rest as told by your health care provider. Return to your normal activities as told by your health care provider. Keep all follow-up visits as told by your health care provider. This is important. Contact a health care provider if: You have itching or red, swollen areas of skin (hives). You feel anxious. You feel weak after doing your normal activities. You have redness, swelling, warmth, or pain around the IV insertion site. You have blood coming  from the IV insertion site that does not stop with pressure. You have pus or a bad smell coming from your IV insertion site. Get help right away if: You have symptoms of a serious allergic or immune system reaction, including: Trouble breathing or shortness of breath. Swelling of the face or feeling flushed. Fever or chills. Pain in the head, back, or chest. Dark urine or blood in the urine. Widespread rash. Fast heartbeat. Feeling dizzy or light-headed. If you receive your blood transfusion in an outpatient setting, you will betold whom to contact to report any reactions. These symptoms may represent a serious problem that is an emergency. Do not wait to see if the symptoms will go away. Get medical help right away. Call your local emergency services (911 in the [place] Do not drive yourself to the hospital. Summary Bruising and tenderness around the IV insertion site are common. Check your IV insertion site every day for signs of infection. Rest as told by your health care provider. Return to your normal activities as told by your health care provider. Get help right away for symptoms of a serious allergic or immune system reaction to blood transfusion. This information is not intended to replace advice given to you by your health care provider. Make sure you discuss any questions you have with your healthcare provider. Document Revised: 11/09/2018 Document Reviewed: 11/09/2018 Elsevier Patient Education  2022 [employer] Inc[employer]  

## 2020-11-23 LAB — BPAM RBC
Blood Product Expiration Date: 202207122359
Blood Product Expiration Date: 202207122359
ISSUE DATE / TIME: 202206250836
ISSUE DATE / TIME: 202206250836
Unit Type and Rh: 6200
Unit Type and Rh: 6200

## 2020-11-23 LAB — TYPE AND SCREEN
ABO/RH(D): A POS
Antibody Screen: NEGATIVE
Unit division: 0
Unit division: 0

## 2020-11-24 ENCOUNTER — Other Ambulatory Visit: Payer: Self-pay | Admitting: *Deleted

## 2020-11-25 ENCOUNTER — Other Ambulatory Visit: Payer: Self-pay | Admitting: Oncology

## 2020-11-26 NOTE — Progress Notes (Signed)
[facility]  Telephone:([phone]) [phone] Fax:(336) [phone]     ID: [name] [name] K Ritzel DOB: 25-Oct-1980  MR#: [id number]  AQT#:[id number]  Patient Care Team: Townsend Roger, MD as PCP - General (Internal Medicine) Derwood Kaplan, MD as PCP - Hematology/Oncology (Oncology) Ladora Osterberg, Virgie Dad, MD as Consulting Physician (Oncology) Jacelyn Pi, MD as Referring Physician (Endocrinology) Armandina Gemma, MD as Consulting Physician (General Surgery) Servando Salina, MD as Consulting Physician (Obstetrics and Gynecology) Chauncey Cruel, MD OTHER MD:  CHIEF COMPLAINT: metastatic breast cancer, estrogen receptor positive  CURRENT TREATMENT: letrozole, palbociclib, goserelin, denosumab   INTERVAL HISTORY: [name] [name] returns today for follow up of her metastatic breast cancer.  Since her last visit, she underwent short-term follow up right diagnostic mammography and right breast ultrasonography at [facility] on 11/12/2020 showing: breast density category C; sonographically resolved right breast mass and right axillary adenopathy; slight increase in number of pleomorphic calcifications in upper-outer right breast without significant change in size or distribution of area of calcifications.  She also underwent bone scan on 11/20/2020 showing: no evidence of new metastatic skeletal lesions.  She takes letrozole daily. She has "hot flashes all the time" and they tend to wake her up at night.  She also is having some vaginal dryness issues.  She continues on palbociclib. Given her neutropenia, we have dropped the dose to 75 mg starting this cycle  She receives denosumab/Xgeva every 4 weeks. She was initially hypocalcemic but now she takes calcium supplementation and has had no further problems with that.  She has an appointment with her dentist in July.  She has not been able to get an appointment before that.  She has no dental complaints at this point.  She receives  goserelin/Zoladex also every 4 weeks.  Aside from menopausal symptoms she has no problems from this medication.  Most recent dose was 10/29/2020   REVIEW OF SYSTEMS: [name] [name] feels much better after her transfusion.  She now has energy she says.  She has some soreness in her mouth although no actual source.  She uses Magic mouthwash for that.  She has a little bit of nausea in the morning but no vomiting.  Is more like a queasiness.  She continues to work 3 days a week (Friday Saturdays and Sundays).  A detailed review of systems today was otherwise stable.   COVID 19 VACCINATION STATUS: Status post Moderna x3, third dose October 2021; booster pending   HISTORY OF CURRENT ILLNESS: From the [facility] note dated 04/10/2020 by Dickey Gave, PA-C:   "[name] [name] K Ambrosini is a 40 y.o. female African American female with stage IV (T3 N1 M1) hormone receptor positive right breast cancer diagnosed in December 2020. We began seeing in November 2020 for leukocytosis, anemia and thrombocytopenia.  She had bruising and nose bleeds, as well as a 10-15 lb weight loss.  She had been having chronic back pain for several months, for which she was using ibuprofen or Aleve.  LDH was markedly elevated at 2599, reticulocyte count mildly elevated at 3.4% and haptoglobin was less than 10, which was consistent with hemolysis, but Coombs was negative.  CT chest, abdomen and pelvis revealed diffusely sclerotic osseous structures with multiple lytic lesions noted throughout the axial skeleton.  Some lesions demonstrated expansile soft tissue components, and these findings are highly concerning for multiple myeloma or other osseous metastatic disease of uncertain origin.  A spiculated appearing mass or tissue element was also seen in the  lateral right breast, 9 o'clock.  Bone marrow biopsy revealed abundant metastatic carcinoma, which was estrogen receptor positive, so consistent with breast cancer origin.  With that  information, it became clear that she had malignant breast cancer that had metastasized to the bone.  She underwent a digital diagnostic bilateral mammogram with right breast ultrasound in December, which revealed highly suspicious calcifications spanning at least 9.2 cm in the upper outer quadrant of the right breast with distortion associated with the posterior calcifications.  A discrete mass was seen in the right breast with ultrasound at 10 o'clock, 8 cm from the nipple measuring 9 x 6 x 7 mm.  Several mildly abnormal nodes are seen in the right axilla with cortices measuring between 3 and 5 mm.  The lymph nodes were symmetric on mammography and were not definitely different compared to 2015.  Stereotactic needle core biopsy of the right breast and right axilla confirmed grade 2, invasive ductal carcinoma, as well as ductal carcinoma in situ.  The invasive component measured 0.9 cm in greatest linear extent on the core biopsy.  Metastatic carcinoma was involved in one lymph node.  Estrogen receptor positive at 90%, progesterone receptor positive at 1%, and HER2 negative.  Ki67 was 15%.  Nuclear medicine PET scan revealed widespread hypermetabolic bony metastatic disease.  Low level hypermetabolism in bilateral axillary nodes, right greater than left was seen with uptake identified in the ill defined soft tissue lesion in the lateral right breast.  Foci of hypermetabolism identified in the central uterus along the IUD and along the anterior cervix and adjacent small bowel, which were nonspecific.  MRI head did not reveal intracranial metastasis, but diffusely abnormal bone marrow in the skull and cervical spine likely due to metastatic disease was observed.  CA 27-29 was not elevated.   Due to her age of diagnosis, she underwent testing for hereditary breast cancer with the [employer]  myRisk hereditary cancer panel test.  This did not reveal any clinically significant mutation.  There were variants of uncertain  significance of the ATM, AXIN2 and MSH3 genes.   She had severer pain, so was placed on methadone, in addition to oxycodone and the doses were titrated up.  We recommended letrozole/palbociclib, denosumab monthly for the bone metastasis and goserelin monthly to suppress ovarian function due to being premenopausal.  She started letrozole on December 28th and denosumab and goserelin on January 7th.  She started palbociclib for 3 weeks on and one week off on January 9th.   She develops severe hypocalcemia, which required IV calcium replacement. Denosumab had to be held due to the severe hypocalcemia, but was resumed in March.  She was placed on high doses of oral calcium.  She had multiple other issues, such as decreased appetite, nausea and vomiting, as well  As dehydration, managed with medications. MRI thoracic and lumbar spine on January 19th revealed diffusely decreased T1 bone marrow signal with fairly homogeneous enhancement throughout the thoracolumbar spine.  Prior PET-CT demonstrated homogeneous hypermetabolic activity throughout the thoracolumbar spine.  These findings are favored to be related to the patient's anemia rather than diffuse metastatic disease.  There were possible small osseous metastases, notably within the T3 spinous process, left L2 pedicle and upper right sacrum.  There was no significant disc herniation, spinal stenosis or nerve root encroachment.  She was subsequently weaned off methadone and has not had recurrent pain.  She developed cytopenias due to palbociclib and we occasionally had to delay cycles   CT chest,  abdomen and pelvis in May revealed diffuse patchy confluent sclerotic osseous metastatic disease throughout the axial and proximal appendicular skeleton, probably representing treatment effect.  ThereWere healed deformities throughout the bilateral ribs.  A new mild right axillary lymphadenopathy was also observed, which wasnonspecific.  No additional sites of metastatic  disease were seen in the chest, abdomen or pelvis.  She had a  tubal ligation and removal of her Mirena IUD in June.  CT chest, abdomen and pelvis in September revealed again widespread osseous sclerosis, indicative of treated metastatic disease.  The previous borderline enlarged right axillary lymph nodes had resolved.  She had recurrent cytopenias, and since we had to delay multiple cycles of palbociclib, we decreased her palbociclib dose to 100 mg daily for 3 weeks on a week off."  The patient's subsequent history is as detailed below.   PAST MEDICAL HISTORY: Past Medical History:  Diagnosis Date   Anemia    History of cardiac murmur as a child    History of Graves' disease    dx hyperthyroidism during 4th pregnancy;   11-15-2013  s/p  total thyroidectomy   Hypothyroidism, postsurgical 11/15/2013   endocrinologist--- dr balan   Pre-diabetes    Stage IV breast cancer in female [facility]) 05/2019   oncologist-- dr c. Hinton Rao ([facility]) bone marrow bx 05-08-2019 and right breast bx 05-17-2020--- primary breast w/ mets to bones, pt taking oral chemo (ibrance)  Tumor associated hemolysis at presentation December 2020   PAST SURGICAL HISTORY: Past Surgical History:  Procedure Laterality Date   IUD REMOVAL N/A 11/22/2019   Procedure: INTRAUTERINE DEVICE (IUD) REMOVAL;  Surgeon: Servando Salina, MD;  Location: [facility];  Service: Gynecology;  Laterality: N/A;   LAPAROSCOPIC TUBAL LIGATION Bilateral 11/22/2019   Procedure: LAPAROSCOPIC TUBAL LIGATION By Cautery;  Surgeon: Servando Salina, MD;  Location: [facility];  Service: Gynecology;  Laterality: Bilateral;   THYROIDECTOMY Bilateral 11/15/2013   Procedure: TOTAL THYROIDECTOMY;  Surgeon: Earnstine Regal, MD;  Location: [facility];  Service: General;  Laterality: Bilateral;   WISDOM TOOTH EXTRACTION  2012    FAMILY HISTORY: Family History  Problem Relation Age of Onset   Cancer Mother         Peripheral T cell Lymphoma   Diabetes Father    Diabetes Maternal Grandmother   The patient's mother died at age 39 shortly after her diagnosis of T-cell non-Hodgkin's lymphoma. The patient's father is 61 years old as of December 2020. He has a history of diabetes, is a bilateral amputee, and has a history of EtOH abuse. The patient has 2 half siblings on her mother side and 3/2 siblings on her father's side. None of them have a history of cancer. She has a maternal cousin with a history of B-cell non-Hodgkin's lymphoma and a maternal grandfather with prostate cancer   GYNECOLOGIC HISTORY:  Patient's last menstrual period was 10/26/2018. Menarche: 40 years old Age at first live birth: 40 years old [place] P 4 LMP December 2020 (when goserelin started Contraceptive: Status post bilateral tubal ligation HRT n/a  Hysterectomy? no BSO? no   SOCIAL HISTORY: (updated 04/2020)  [name] [name] is divorced. Her former husband Minie Roadcap is a Armed forces technical officer for the [employer]. 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  She works on Friday Saturdays and Sundays only.  Her daughters were called [name], Donald Pore, and [name], age 23-9 as of December 2021. They stay 1 week with the patient in 1 week with her ex-husband.  The patient also has a burn doodle called Karlene Lineman. The patient is a Psychologist, forensic    ADVANCED DIRECTIVES: Not in place. At the 05/06/2020 visit the patient was given the appropriate documents to complete and notarized at her discretion.   HEALTH MAINTENANCE: Social History   Tobacco Use   Smoking status: Never   Smokeless tobacco: Never  Vaping Use   Vaping Use: Never used  Substance Use Topics   Alcohol use: Not Currently    Comment: social   Drug use: Never     Colonoscopy: n/a (age)  PAP: Up-to-date  Bone density: Remote   No Known Allergies  Current Outpatient Medications  Medication Sig Dispense Refill   gabapentin (NEURONTIN) 100 MG capsule Take 1 capsule (100 mg total) by  mouth at bedtime. 90 capsule 4   b complex vitamins capsule Take 1 capsule by mouth daily.     Calcium Carb-Cholecalciferol (CALCIUM 600 + D PO) Take 4 tablets by mouth daily.     Cholecalciferol (VITAMIN D3) 25 MCG (1000 UT) CAPS Take 3 capsules by mouth daily.     Denosumab (XGEVA [place]) Inject into the skin every 30 (thirty) days.     Goserelin Acetate (ZOLADEX ) Inject into the skin every 30 (thirty) days.     ibuprofen (ADVIL) 800 MG tablet Take 1 tablet (800 mg total) by mouth every 8 (eight) hours as needed for moderate pain. 30 tablet 5   letrozole (FEMARA) 2.5 MG tablet Take 1 tablet (2.5 mg total) by mouth daily. 90 tablet 4   levothyroxine (SYNTHROID) 125 MCG tablet Take 125 mcg by mouth every morning.      metFORMIN (GLUCOPHAGE) 500 MG tablet Take 1 tablet (500 mg total) by mouth 2 (two) times daily with a meal.     ondansetron (ZOFRAN) 8 MG tablet Take 1 tablet (8 mg total) by mouth 3 (three) times daily as needed for nausea or vomiting. 20 tablet 0   palbociclib (IBRANCE) 75 MG capsule Take 1 capsule (75 mg total) by mouth daily with breakfast. Take whole with food. Take for 21 days on, 7 days off, repeat every 28 days. 21 capsule 6   valACYclovir (VALTREX) 500 MG tablet Take 1 tablet (500 mg total) by mouth daily. 90 tablet 4   No current facility-administered medications for this visit.    OBJECTIVE: African-American woman who appears well  Vitals:   11/27/20 1147  BP: 113/74  Pulse: 73  Resp: 18  Temp: (!) 97.5 F (36.4 C)  SpO2: 100%     Body mass index is 32.75 kg/m.   Wt Readings from Last 3 Encounters:  11/27/20 196 lb 12.8 oz (89.3 kg)  08/28/20 202 lb 8 oz (91.9 kg)  06/05/20 199 lb 12.8 oz (90.6 kg)      ECOG FS:1 - Symptomatic but completely ambulatory  Sclerae unicteric, EOMs intact Wearing a mask No cervical or supraclavicular adenopathy Lungs no rales or rhonchi Heart regular rate and rhythm Abd soft, nontender, positive bowel sounds MSK no focal  spinal tenderness, no upper extremity lymphedema Neuro: nonfocal, well oriented, appropriate affect Breasts: I do not palpate a mass in the right breast and there are no skin or nipple changes of concern.  The left breast and both axillae are benign   Lesion posterior Right shoulder ("been there all my life")    LAB RESULTS:  CMP     Component Value Date/Time   NA 137 11/27/2020 1140   NA 140 04/16/2020 0000  K 3.8 11/27/2020 1140   CL 106 11/27/2020 1140   CO2 23 11/27/2020 1140   GLUCOSE 83 11/27/2020 1140   BUN 12 11/27/2020 1140   BUN 18 04/16/2020 0000   CREATININE 0.72 11/27/2020 1140   CREATININE 0.93 11/11/2020 1034   CALCIUM 9.6 11/27/2020 1140   PROT 7.5 11/27/2020 1140   ALBUMIN 3.5 11/27/2020 1140   AST 88 (H) 11/27/2020 1140   AST 38 11/11/2020 1034   ALT 99 (H) 11/27/2020 1140   ALT 33 11/11/2020 1034   ALKPHOS 122 11/27/2020 1140   BILITOT 0.3 11/27/2020 1140   BILITOT 0.3 11/11/2020 1034   GFRNONAA >60 11/27/2020 1140   GFRNONAA >60 11/11/2020 1034   GFRAA >60 04/28/2019 1218    No results found for: TOTALPROTELP, ALBUMINELP, A1GS, A2GS, BETS, BETA2SER, GAMS, MSPIKE, SPEI  Lab Results  Component Value Date   WBC 3.4 (L) 11/27/2020   NEUTROABS 1.1 (L) 11/27/2020   HGB 9.8 (L) 11/27/2020   HCT 29.5 (L) 11/27/2020   MCV 86.5 11/27/2020   PLT 99 (L) 11/27/2020    No results found for: LABCA2  No components found for: [id number]  No results for input(s): INR in the last 168 hours.  No results found for: LABCA2  No results found for: NKN397  No results found for: QBH419  No results found for: FXT024  Lab Results  Component Value Date   CA2729 10.7 05/06/2020    No components found for: HGQUANT  No results found for: CEA1 / No results found for: CEA1   No results found for: AFPTUMOR  No results found for: CHROMOGRNA  No results found for: KPAFRELGTCHN, LAMBDASER, KAPLAMBRATIO (kappa/lambda light chains)  No results found for:  HGBA, HGBA2QUANT, HGBFQUANT, HGBSQUAN (Hemoglobinopathy evaluation)   Lab Results  Component Value Date   LDH 317 (H) 05/06/2020    No results found for: IRON, TIBC, IRONPCTSAT (Iron and TIBC)  Lab Results  Component Value Date   FERRITIN 99 05/06/2020    Urinalysis    Component Value Date/Time   COLORURINE YELLOW 04/28/2019 1223   APPEARANCEUR HAZY (A) 04/28/2019 1223   LABSPEC 1.024 04/28/2019 1223   PHURINE 5.0 04/28/2019 [place] 04/28/2019 1223   HGBUR NEGATIVE 04/28/2019 [place] 04/28/2019 [place] 04/28/2019 [place] 04/28/2019 1223   NITRITE NEGATIVE 04/28/2019 [place] 04/28/2019 1223    STUDIES: NM Bone Scan Whole Body  Result Date: 11/21/2020 CLINICAL DATA:  Breast cancer.  Prior osseous metastasis. EXAM: NUCLEAR MEDICINE WHOLE BODY BONE SCAN TECHNIQUE: Whole body anterior and posterior images were obtained approximately 3 hours after intravenous injection of radiopharmaceutical. RADIOPHARMACEUTICALS:  20.2 mCi Technetium-65mMDP IV COMPARISON:  Bone scan 05/21/2020, CT chest 05/19/2020 FINDINGS: Again demonstrated multifocal skeletal metastasis. Lesionin the posterior ribs, posterior sacrum and medial iliac bones as well as proximal femoral heads are more distinct than comparison exam however this may relate to technique. No clear evidence of progression. No evidence of pathologic fracture. Activity in the long bones of the lower extremity appear increased/more well-defined. IMPRESSION: 1. No evidence of new metastatic skeletal lesions. 2. Previously noted metastatic skeletal lesions in the posterior ribs, bony pelvis, proximal femurs are similar but more defined which may relate to technique. 3. Uptake within the femurs and tibia are more defined than prior which again may relate to technique. Electronically Signed   By: SSuzy BouchardM.D.   On: 11/21/2020 14:07  US BREAST  LTD UNI RIGHT INC AXILLA  Result Date: 11/12/2020 CLINICAL DATA:  Stage IV breast cancer in the right breast, treated with oral chemotherapy. Follow-up calcifications and mass. EXAM: DIGITAL DIAGNOSTIC UNILATERAL RIGHT MAMMOGRAM WITH TOMOSYNTHESIS AND CAD; ULTRASOUND RIGHT BREAST LIMITED TECHNIQUE: Right digital diagnostic mammography and breast tomosynthesis was performed. The images were evaluated with computer-aided detection.; Targeted ultrasound examination of the right breast was performed COMPARISON:  Previous exam(s). ACR Breast Density Category b: There are scattered areas of fibroglandular density. FINDINGS: There has been a mild increase in number of pleomorphic calcifications in the upper outer right breast without significant change in size or distribution of the area of calcifications since 06/25/2020. No interval findings elsewhere in the breast suspicious for malignancy. Targeted ultrasound is performed, showing no residual mass at the location of the previously demonstrated small residual mass in the 10 o'clock position of the right breast, 8 cm from the nipple. The right axillary lymph nodes currently have a normal appearance with a maximum cortical thickness of 2.8 mm. IMPRESSION: 1. Sonographically resolved mass in the 10 o'clock position of the right breast. 2. Sonographically resolved right axillary adenopathy. 3. Slight increase in number of pleomorphic calcifications in the upper-outer right breast without significant change in size or distribution of the area of calcifications. RECOMMENDATION: 1. Treatment plan. 2. Bilateral diagnostic mammogram in 6 months. That will be 1 year since mammographic evaluation of the left breast. I have discussed the findings and recommendations with the patient. If applicable, a reminder letter will be sent to the patient regarding the next appointment. BI-RADS CATEGORY  6: Known biopsy-proven malignancy. Electronically Signed   By: Claudie Revering M.D.   On:  11/12/2020 09:58  MM DIAG BREAST TOMO UNI RIGHT  Result Date: 11/12/2020 CLINICAL DATA:  Stage IV breast cancer in the right breast, treated with oral chemotherapy. Follow-up calcifications and mass. EXAM: DIGITAL DIAGNOSTIC UNILATERAL RIGHT MAMMOGRAM WITH TOMOSYNTHESIS AND CAD; ULTRASOUND RIGHT BREAST LIMITED TECHNIQUE: Right digital diagnostic mammography and breast tomosynthesis was performed. The images were evaluated with computer-aided detection.; Targeted ultrasound examination of the right breast was performed COMPARISON:  Previous exam(s). ACR Breast Density Category b: There are scattered areas of fibroglandular density. FINDINGS: There has been a mild increase in number of pleomorphic calcifications in the upper outer right breast without significant change in size or distribution of the area of calcifications since 06/25/2020. No interval findings elsewhere in the breast suspicious for malignancy. Targeted ultrasound is performed, showing no residual mass at the location of the previously demonstrated small residual mass in the 10 o'clock position of the right breast, 8 cm from the nipple. The right axillary lymph nodes currently have a normal appearance with a maximum cortical thickness of 2.8 mm. IMPRESSION: 1. Sonographically resolved mass in the 10 o'clock position of the right breast. 2. Sonographically resolved right axillary adenopathy. 3. Slight increase in number of pleomorphic calcifications in the upper-outer right breast without significant change in size or distribution of the area of calcifications. RECOMMENDATION: 1. Treatment plan. 2. Bilateral diagnostic mammogram in 6 months. That will be 1 year since mammographic evaluation of the left breast. I have discussed the findings and recommendations with the patient. If applicable, a reminder letter will be sent to the patient regarding the next appointment. BI-RADS CATEGORY  6: Known biopsy-proven malignancy. Electronically Signed   By:  Claudie Revering M.D.   On: 11/12/2020 09:58    ELIGIBLE FOR AVAILABLE RESEARCH PROTOCOL: no  ASSESSMENT: 40 y.o.   woman presenting NOV 2020 with stage IV breast cancer as follows:  (a) workup of initial pancytopenia and hemolysis showed a leukoerythroblastic blood picture with bone marrow biopsy 05/07/2020 confirming metastatic carcinoma, estrogen receptor positive; cytogenetics were normal  (b) CT scans at baseline showing multiple bone lesions (both sclerotic and lytic) and a possible mass in the right breast; no definitive lung or liver involvement  (c) right breast upper outer quadrant biopsy x2 and right axillary lymph node biopsy on 05/18/2019 confirmed a clinical 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, grade 1-2, estrogen receptor strongly positive, progesterone receptor moderately positive (1%), HER-2 negative, with an MIB-1 of 2-15%  (d) CA 27-29 was not informative (repeat 05/06/2020 WNL)  (1) letrozole started 05/28/2019  (a) goserelin started 06/07/2019, repeated every 4 weeks  (b) palbociclib added 06/09/2019  (c) palbociclib dose decreased to 100 mg daily, 21/7, with September 2021 cycle  (2) denosumab/Xgeva started 06/07/2019, repeated every 4 weeks  (3) genetics testing through the [employer] MyRisk panel dated 09/22/2019 found no deleterious mutations in the genes tested which included BRCA 1 and 2, CHEK2, PALB 2 and TP53 among others.  (a) variants of uncertain significance were found in ATM, AXN 2, and [place] 3.  (4) restaging studies:  (a) chest CT and bone scan 05/21/2020 shows no visceral disease, stable bone lesions  (b) bilateral mammography and right breast ultrasonography 06/25/2020 shows the measurable disease in the right breast to have decreased.  There is some progressive calcifications that require monitoring  (c) bone scan 11/21/2020 shows no new bone lesions  (d) breast ultrasound 11/12/2020 shows the previously noted 10:00 mass to have resolved.  The  right axillary adenopathy also has resolved.  There is some increase in pleomorphic calcifications as previously noted  (5) chemotherapy associated anemia, requiring transfusion:  (a) EPO started 11/27/2020   PLAN: [name] [name] is now a little more than a year and a half out from initial presentation with metastatic breast cancer.  She currently has no symptoms related to her tumor.  She is having an excellent response as just documented by her breast ultrasonography.  She generally tolerates her treatment well except for cytopenias.  We are starting her on EPO today.  We can do this every 4 weeks at the same time as she receives her denosumab/Xgeva and her goserelin.  Hopefully we can maintain her hemoglobin above 10 and keep her from having a repeated transfusion or or excessive fatigue as she experienced recently.  We are adding gabapentin which I hope will allow her to sleep a little bit better.  Otherwise she knows to call for any other issue that may develop before the next visit.  Total encounter time 25 minutes.Sarajane Jews C. Tanay Misuraca, MD 11/27/2020 7:03 PM Medical Oncology and Hematology [facility] [place], [place] [address] Tel. [phone]    Fax. [phone]   This document serves as a record of services personally performed by Lurline Del, MD. It was created on his behalf by Wilburn Mylar, a trained medical scribe. The creation of this record is based on the scribe's personal observations and the provider's statements to them.   I, Lurline Del MD, have reviewed the above documentation for accuracy and completeness, and I agree with the above.   *Total Encounter Time as defined by the Centers for Medicare and Medicaid Services includes, in addition to the face-to-face time of a patient visit (documented in the note above) non-face-to-face time: obtaining and  reviewing outside history, ordering and reviewing medications, tests or procedures,  care coordination (communications with other health care professionals or caregivers) and documentation in the medical record.

## 2020-11-27 ENCOUNTER — Ambulatory Visit: Payer: Commercial Managed Care - PPO

## 2020-11-27 ENCOUNTER — Other Ambulatory Visit: Payer: Self-pay

## 2020-11-27 ENCOUNTER — Inpatient Hospital Stay: Payer: Commercial Managed Care - PPO

## 2020-11-27 ENCOUNTER — Inpatient Hospital Stay (HOSPITAL_BASED_OUTPATIENT_CLINIC_OR_DEPARTMENT_OTHER): Payer: Commercial Managed Care - PPO | Admitting: Oncology

## 2020-11-27 VITALS — BP 113/74 | HR 73 | Temp 97.5°F | Resp 18 | Ht 65.0 in | Wt 196.8 lb

## 2020-11-27 DIAGNOSIS — E0500 Thyrotoxicosis with diffuse goiter without thyrotoxic crisis or storm: Secondary | ICD-10-CM

## 2020-11-27 DIAGNOSIS — D649 Anemia, unspecified: Secondary | ICD-10-CM

## 2020-11-27 DIAGNOSIS — Z5111 Encounter for antineoplastic chemotherapy: Secondary | ICD-10-CM | POA: Diagnosis not present

## 2020-11-27 DIAGNOSIS — C50411 Malignant neoplasm of upper-outer quadrant of right female breast: Secondary | ICD-10-CM | POA: Diagnosis not present

## 2020-11-27 DIAGNOSIS — Z170 Estrogen receptor positive status [ER+]: Secondary | ICD-10-CM

## 2020-11-27 DIAGNOSIS — C50811 Malignant neoplasm of overlapping sites of right female breast: Secondary | ICD-10-CM

## 2020-11-27 DIAGNOSIS — D5919 Other autoimmune hemolytic anemia: Secondary | ICD-10-CM

## 2020-11-27 DIAGNOSIS — C7951 Secondary malignant neoplasm of bone: Secondary | ICD-10-CM

## 2020-11-27 LAB — COMPREHENSIVE METABOLIC PANEL
ALT: 99 U/L — ABNORMAL HIGH (ref 0–44)
AST: 88 U/L — ABNORMAL HIGH (ref 15–41)
Albumin: 3.5 g/dL (ref 3.5–5.0)
Alkaline Phosphatase: 122 U/L (ref 38–126)
Anion gap: 8 (ref 5–15)
BUN: 12 mg/dL (ref 6–20)
CO2: 23 mmol/L (ref 22–32)
Calcium: 9.6 mg/dL (ref 8.9–10.3)
Chloride: 106 mmol/L (ref 98–111)
Creatinine, Ser: 0.72 mg/dL (ref 0.44–1.00)
GFR, Estimated: >60 mL/min (ref >60)
Glucose, Bld: 83 mg/dL (ref 70–99)
Potassium: 3.8 mmol/L (ref 3.5–5.1)
Sodium: 137 mmol/L (ref 135–145)
Total Bilirubin: 0.3 mg/dL (ref 0.3–1.2)
Total Protein: 7.5 g/dL (ref 6.5–8.1)

## 2020-11-27 LAB — CBC WITH DIFFERENTIAL/PLATELET
Abs Immature Granulocytes: 0.03 10*3/uL (ref 0.00–0.07)
Basophils Absolute: 0 10*3/uL (ref 0.0–0.1)
Basophils Relative: 1 %
Eosinophils Absolute: 0.1 10*3/uL (ref 0.0–0.5)
Eosinophils Relative: 2 %
HCT: 29.5 % — ABNORMAL LOW (ref 36.0–46.0)
Hemoglobin: 9.8 g/dL — ABNORMAL LOW (ref 12.0–15.0)
Immature Granulocytes: 1 %
Lymphocytes Relative: 49 %
Lymphs Abs: 1.7 10*3/uL (ref 0.7–4.0)
MCH: 28.7 pg (ref 26.0–34.0)
MCHC: 33.2 g/dL (ref 30.0–36.0)
MCV: 86.5 fL (ref 80.0–100.0)
Monocytes Absolute: 0.5 10*3/uL (ref 0.1–1.0)
Monocytes Relative: 14 %
Neutro Abs: 1.1 10*3/uL — ABNORMAL LOW (ref 1.7–7.7)
Neutrophils Relative %: 33 %
Platelets: 99 10*3/uL — ABNORMAL LOW (ref 150–400)
RBC: 3.41 MIL/uL — ABNORMAL LOW (ref 3.87–5.11)
RDW: 16.2 % — ABNORMAL HIGH (ref 11.5–15.5)
WBC: 3.4 10*3/uL — ABNORMAL LOW (ref 4.0–10.5)
nRBC: 0.6 % — ABNORMAL HIGH (ref 0.0–0.2)

## 2020-11-27 MED ORDER — DENOSUMAB 120 MG/1.7ML ~~LOC~~ SOLN
SUBCUTANEOUS | Status: AC
  Filled 2020-11-27: qty 1.7

## 2020-11-27 MED ORDER — GABAPENTIN 100 MG PO CAPS: 100.0000 mg | ORAL_CAPSULE | Freq: Every day | ORAL | 4 refills | Status: DC

## 2020-11-27 MED ORDER — DENOSUMAB 120 MG/1.7ML ~~LOC~~ SOLN
120.0000 mg | Freq: Once | SUBCUTANEOUS | Status: AC
  Administered 2020-11-27: 120 mg via SUBCUTANEOUS

## 2020-11-27 MED ORDER — DARBEPOETIN ALFA 500 MCG/ML IJ SOSY
PREFILLED_SYRINGE | INTRAMUSCULAR | Status: AC
  Filled 2020-11-27: qty 1

## 2020-11-27 MED ORDER — DARBEPOETIN ALFA 500 MCG/ML IJ SOSY
500.0000 ug | PREFILLED_SYRINGE | Freq: Once | INTRAMUSCULAR | Status: AC
  Administered 2020-11-27: 500 ug via SUBCUTANEOUS

## 2020-11-27 MED ORDER — GOSERELIN ACETATE 3.6 MG ~~LOC~~ IMPL
DRUG_IMPLANT | SUBCUTANEOUS | Status: AC
  Filled 2020-11-27: qty 3.6

## 2020-11-27 MED ORDER — GOSERELIN ACETATE 3.6 MG ~~LOC~~ IMPL
3.6000 mg | DRUG_IMPLANT | Freq: Once | SUBCUTANEOUS | Status: AC
  Administered 2020-11-27: 3.6 mg via SUBCUTANEOUS

## 2020-11-27 NOTE — Patient Instructions (Signed)
Darbepoetin Alfa injection What is this medication? DARBEPOETIN ALFA (dar be POE e tin AL fa) helps your body make more red blood cells. It is used to treat anemia caused by chronic kidney failure and chemotherapy. This medicine may be used for other purposes; ask your health care provider or pharmacist if you have questions. COMMON BRAND NAME(S): Aranesp What should I tell my care team before I take this medication? They need to know if you have any of these conditions: blood clotting disorders or history of blood clots cancer patient not on chemotherapy cystic fibrosis heart disease, such as angina, heart failure, or a history of a heart attack hemoglobin level of 12 g/dL or greater high blood pressure low levels of folate, iron, or vitamin B12 seizures an unusual or allergic reaction to darbepoetin, erythropoietin, albumin, hamster proteins, latex, other medicines, foods, dyes, or preservatives pregnant or trying to get pregnant breast-feeding How should I use this medication? This medicine is for injection into a vein or under the skin. It is usually given by a health care professional in a hospital or clinic setting. If you get this medicine at home, you will be taught how to prepare and give this medicine. Use exactly as directed. Take your medicine at regular intervals. Do not take your medicine more often than directed. It is important that you put your used needles and syringes in a special sharps container. Do not put them in a trash can. If you do not have a sharps container, call your pharmacist or healthcare provider to get one. A special MedGuide will be given to you by the pharmacist with each prescription and refill. Be sure to read this information carefully each time. Talk to your pediatrician regarding the use of this medicine in children. While this medicine may be used in children as young as 1 month of age for selected conditions, precautions do apply. Overdosage: If you  think you have taken too much of this medicine contact a poison control center or emergency room at once. NOTE: This medicine is only for you. Do not share this medicine with others. What if I miss a dose? If you miss a dose, take it as soon as you can. If it is almost time for your next dose, take only that dose. Do not take double or extra doses. What may interact with this medication? Do not take this medicine with any of the following medications: epoetin alfa This list may not describe all possible interactions. Give your health care provider a list of all the medicines, herbs, non-prescription drugs, or dietary supplements you use. Also tell them if you smoke, drink alcohol, or use illegal drugs. Some items may interact with your medicine. What should I watch for while using this medication? Your condition will be monitored carefully while you are receiving this medicine. You may need blood work done while you are taking this medicine. This medicine may cause a decrease in vitamin B6. You should make sure that you get enough vitamin B6 while you are taking this medicine. Discuss the foods you eat and the vitamins you take with your health care professional. What side effects may I notice from receiving this medication? Side effects that you should report to your doctor or health care professional as soon as possible: allergic reactions like skin rash, itching or hives, swelling of the face, lips, or tongue breathing problems changes in vision chest pain confusion, trouble speaking or understanding feeling faint or lightheaded, falls high blood pressure   severe headaches sudden numbness or weakness of the face, arm or leg trouble walking, dizziness, loss of balance or coordination seizures (convulsions) swelling of the ankles, feet, hands unusually weak or tired Side effects that usually do not require medical attention  (report to yourdoctor or health care professional if they continue or are bothersome): diarrhea fever, chills (flu-like symptoms) headaches nausea, vomiting redness, stinging, or swelling at site where injected This list may not describe all possible side effects. Call your doctor for medical advice about side effects. You may report side effects to FDA at1-800-FDA-1088. Where should I keep my medication? Keep out of the reach of children. Store in a refrigerator between 2 and 8 degrees C (36 and 46 degrees F). Do not freeze. Do not shake. Throw away any unused portion if using a single-dosevial. Throw away any unused medicine after the expiration date. NOTE: This sheet is a summary. It may not cover all possible information. If you have questions about this medicine, talk to your doctor, pharmacist, orhealth care provider.  Goserelin injection What is this medication? GOSERELIN (GOE se rel in) is similar to a hormone found in the body. It lowers the amount of sex hormones that the body makes. Men will have lower testosterone levels and women will have lower estrogen levels while taking this medicine. In men, this medicine is used to treat prostate cancer; the injection is either given once per month or once every 12 weeks. A once per month injection (only) is used to treat women with endometriosis, dysfunctionaluterine bleeding, or advanced breast cancer. This medicine may be used for other purposes; ask your health care provider orpharmacist if you have questions. COMMON BRAND NAME(S): Zoladex What should I tell my care team before I take this medication? They need to know if you have any of these conditions: bone problems diabetes heart disease history of irregular heartbeat an unusual or allergic reaction to goserelin, other medicines, foods, dyes, or preservatives pregnant or trying to get pregnant breast-feeding How should I use this medication? This medicine is for injection under  the skin. It is given by a health careprofessional in a hospital or clinic setting. Talk to your pediatrician regarding the use of this medicine in children.Special care may be needed. Overdosage: If you think you have taken too much of this medicine contact apoison control center or emergency room at once. NOTE: This medicine is only for you. Do not share this medicine with others. What if I miss a dose? It is important not to miss your dose. Call your doctor or health careprofessional if you are unable to keep an appointment. What may interact with this medication? Do not take this medicine with any of the following medications: cisapride dronedarone pimozide thioridazine This medicine may also interact with the following medications: other medicines that prolong the QT interval (an abnormal heart rhythm) This list may not describe all possible interactions. Give your health care provider a list of all the medicines, herbs, non-prescription drugs, or dietary supplements you use. Also tell them if you smoke, drink alcohol, or use illegaldrugs. Some items may interact with your medicine. What should I watch for while using this medication? Visit your doctor or health care provider for regular checks on your progress. Your symptoms may appear to get worse during the first weeks of this therapy. Tell your doctor or healthcare provider if your symptoms do not start to getbetter or if they get worse after this time. Your bones may get weaker if you  take this medicine for a long time. If you smoke or frequently drink alcohol you may increase your risk of bone loss. A family history of osteoporosis, chronic use of drugs for seizures (convulsions), or corticosteroids can also increase your risk of bone loss.Talk to your doctor about how to keep your bones strong. This medicine should stop regular monthly menstruation in women. Tell yourdoctor if you continue to menstruate. Women should not become  pregnant while taking this medicine or for 12 weeks after stopping this medicine. Women should inform their doctor if they wish to become pregnant or think they might be pregnant. There is a potential for serious side effects to an unborn child. Talk to your health care professional or pharmacist for more information. Do not breast-feed an infant while takingthis medicine. Men should inform their doctors if they wish to father a child. This medicine may lower sperm counts. Talk to your health care professional or pharmacist formore information. This medicine may increase blood sugar. Ask your healthcare provider if changesin diet or medicines are needed if you have diabetes. What side effects may I notice from receiving this medication? Side effects that you should report to your doctor or health care professionalas soon as possible: allergic reactions like skin rash, itching or hives, swelling of the face, lips, or tongue bone pain breathing problems changes in vision chest pain feeling faint or lightheaded, falls fever, chills pain, swelling, warmth in the leg pain, tingling, numbness in the hands or feet signs and symptoms of high blood sugar such as being more thirsty or hungry or having to urinate more than normal. You may also feel very tired or have blurry vision signs and symptoms of low blood pressure like dizziness; feeling faint or lightheaded, falls; unusually weak or tired stomach pain swelling of the ankles, feet, hands trouble passing urine or change in the amount of urine unusually high or low blood pressure unusually weak or tired Side effects that usually do not require medical attention (report to yourdoctor or health care professional if they continue or are bothersome): change in sex drive or performance changes in breast size in both males and females changes in emotions or moods headache hot flashes irritation at site where injected loss of appetite skin problems  like acne, dry skin vaginal dryness This list may not describe all possible side effects. Call your doctor for medical advice about side effects. You may report side effects to FDA at1-800-FDA-1088. Where should I keep my medication? This drug is given in a hospital or clinic and will not be stored at home. NOTE: This sheet is a summary. It may not cover all possible information. If you have questions about this medicine, talk to your doctor, pharmacist, orhealth care provider.  2022 Elsevier/Gold Standard (2018-09-04 14:05:56)  Denosumab injection What is this medication? DENOSUMAB (den oh sue mab) slows bone breakdown. Prolia is used to treat osteoporosis in women after menopause and in men, and in people who are taking corticosteroids for 6 months or more. Delton See is used to treat a high calcium level due to cancer and to prevent bone fractures and other bone problems caused by multiple myeloma or cancer bone metastases. Delton See is also used totreat giant cell tumor of the bone. This medicine may be used for other purposes; ask your health care provider orpharmacist if you have questions. COMMON BRAND NAME(S): Prolia, XGEVA What should I tell my care team before I take this medication? They need to know if you have any  of these conditions: dental disease having surgery or tooth extraction infection kidney disease low levels of calcium or Vitamin D in the blood malnutrition on hemodialysis skin conditions or sensitivity thyroid or parathyroid disease an unusual reaction to denosumab, other medicines, foods, dyes, or preservatives pregnant or trying to get pregnant breast-feeding How should I use this medication? This medicine is for injection under the skin. It is given by a health careprofessional in a hospital or clinic setting. A special MedGuide will be given to you before each treatment. Be sure to readthis information carefully each time. For Prolia, talk to your pediatrician  regarding the use of this medicine in children. Special care may be needed. For Delton See, talk to your pediatrician regarding the use of this medicine in children. While this drug may be prescribed for children as young as 13 years for selected conditions,precautions do apply. Overdosage: If you think you have taken too much of this medicine contact apoison control center or emergency room at once. NOTE: This medicine is only for you. Do not share this medicine with others. What if I miss a dose? It is important not to miss your dose. Call your doctor or health careprofessional if you are unable to keep an appointment. What may interact with this medication? Do not take this medicine with any of the following medications: other medicines containing denosumab This medicine may also interact with the following medications: medicines that lower your chance of fighting infection steroid medicines like prednisone or cortisone This list may not describe all possible interactions. Give your health care provider a list of all the medicines, herbs, non-prescription drugs, or dietary supplements you use. Also tell them if you smoke, drink alcohol, or use illegaldrugs. Some items may interact with your medicine. What should I watch for while using this medication? Visit your doctor or health care professional for regular checks on your progress. Your doctor or health care professional may order blood tests andother tests to see how you are doing. Call your doctor or health care professional for advice if you get a fever, chills or sore throat, or other symptoms of a cold or flu. Do not treat yourself. This drug may decrease your body's ability to fight infection. Try toavoid being around people who are sick. You should make sure you get enough calcium and vitamin D while you are taking this medicine, unless your doctor tells you not to. Discuss the foods you eatand the vitamins you take with your health care  professional. See your dentist regularly. Brush and floss your teeth as directed. Before youhave any dental work done, tell your dentist you are receiving this medicine. Do not become pregnant while taking this medicine or for 5 months after stopping it. Talk with your doctor or health care professional about your birth control options while taking this medicine. Women should inform their doctor if they wish to become pregnant or think they might be pregnant. There is a potential for serious side effects to an unborn child. Talk to your health careprofessional or pharmacist for more information. What side effects may I notice from receiving this medication? Side effects that you should report to your doctor or health care professionalas soon as possible: allergic reactions like skin rash, itching or hives, swelling of the face, lips, or tongue bone pain breathing problems dizziness jaw pain, especially after dental work redness, blistering, peeling of the skin signs and symptoms of infection like fever or chills; cough; sore throat; pain or trouble passing urine  signs of low calcium like fast heartbeat, muscle cramps or muscle pain; pain, tingling, numbness in the hands or feet; seizures unusual bleeding or bruising unusually weak or tired Side effects that usually do not require medical attention (report to yourdoctor or health care professional if they continue or are bothersome): constipation diarrhea headache joint pain loss of appetite muscle pain runny nose tiredness upset stomach This list may not describe all possible side effects. Call your doctor for medical advice about side effects. You may report side effects to FDA at1-800-FDA-1088. Where should I keep my medication? This medicine is only given in a clinic, doctor's office, or other health caresetting and will not be stored at home. NOTE: This sheet is a summary. It may not cover all possible information. If you have  questions about this medicine, talk to your doctor, pharmacist, orhealth care provider.  2022 Elsevier/Gold Standard (2017-09-23 16:10:44)    2022 Elsevier/Gold Standard (2017-06-01 16:44:20)

## 2020-11-28 LAB — CANCER ANTIGEN 27.29: CA 27.29: 16.2 U/mL (ref 0.0–38.6)

## 2020-12-03 ENCOUNTER — Telehealth: Payer: Self-pay | Admitting: Oncology

## 2020-12-03 NOTE — Telephone Encounter (Signed)
Scheduled appointment per 06/30 los. Patient is aware.

## 2020-12-16 ENCOUNTER — Other Ambulatory Visit: Payer: Self-pay | Admitting: Oncology

## 2020-12-18 ENCOUNTER — Telehealth: Payer: Self-pay | Admitting: *Deleted

## 2020-12-18 ENCOUNTER — Other Ambulatory Visit: Payer: Self-pay | Admitting: *Deleted

## 2020-12-18 NOTE — Telephone Encounter (Signed)
This RN spoke with pt per her call stating ongoing nausea with vomiting over the past week.  She states she is using the zofran with minimal benefit - today she has vomited twice.  Note she is working - so nausea is present but not overwhelming - just ongoing and unrelieved.  Pt was seen 11/27/2020 by MD and received zoladex, xgeva and aranesp on same day.  She is on Ibrance therapy as well.  She denies possible pregnancy concern ( has had tubal ligation ).  She noted on her labs slightly elevated liver enzymes.  This RN informed her above would be reviewed with [facility]/NP per MD out of the office.

## 2020-12-26 ENCOUNTER — Other Ambulatory Visit: Payer: Self-pay

## 2020-12-26 ENCOUNTER — Encounter: Payer: Self-pay | Admitting: Adult Health

## 2020-12-26 ENCOUNTER — Inpatient Hospital Stay: Payer: Commercial Managed Care - PPO

## 2020-12-26 ENCOUNTER — Inpatient Hospital Stay (HOSPITAL_BASED_OUTPATIENT_CLINIC_OR_DEPARTMENT_OTHER): Payer: Commercial Managed Care - PPO | Admitting: Adult Health

## 2020-12-26 ENCOUNTER — Inpatient Hospital Stay: Payer: Commercial Managed Care - PPO | Attending: Oncology

## 2020-12-26 VITALS — BP 128/64 | HR 97 | Temp 97.5°F | Resp 19 | Ht 65.0 in | Wt 189.6 lb

## 2020-12-26 DIAGNOSIS — E039 Hypothyroidism, unspecified: Secondary | ICD-10-CM | POA: Insufficient documentation

## 2020-12-26 DIAGNOSIS — Z79899 Other long term (current) drug therapy: Secondary | ICD-10-CM | POA: Insufficient documentation

## 2020-12-26 DIAGNOSIS — Z170 Estrogen receptor positive status [ER+]: Secondary | ICD-10-CM

## 2020-12-26 DIAGNOSIS — D649 Anemia, unspecified: Secondary | ICD-10-CM

## 2020-12-26 DIAGNOSIS — C50411 Malignant neoplasm of upper-outer quadrant of right female breast: Secondary | ICD-10-CM

## 2020-12-26 DIAGNOSIS — C7951 Secondary malignant neoplasm of bone: Secondary | ICD-10-CM

## 2020-12-26 DIAGNOSIS — T451X5A Adverse effect of antineoplastic and immunosuppressive drugs, initial encounter: Secondary | ICD-10-CM | POA: Diagnosis not present

## 2020-12-26 DIAGNOSIS — C50811 Malignant neoplasm of overlapping sites of right female breast: Secondary | ICD-10-CM

## 2020-12-26 DIAGNOSIS — E0500 Thyrotoxicosis with diffuse goiter without thyrotoxic crisis or storm: Secondary | ICD-10-CM

## 2020-12-26 DIAGNOSIS — Z79811 Long term (current) use of aromatase inhibitors: Secondary | ICD-10-CM | POA: Diagnosis not present

## 2020-12-26 DIAGNOSIS — Z7189 Other specified counseling: Secondary | ICD-10-CM | POA: Diagnosis not present

## 2020-12-26 DIAGNOSIS — D5919 Other autoimmune hemolytic anemia: Secondary | ICD-10-CM

## 2020-12-26 DIAGNOSIS — D6481 Anemia due to antineoplastic chemotherapy: Secondary | ICD-10-CM | POA: Insufficient documentation

## 2020-12-26 DIAGNOSIS — Z5111 Encounter for antineoplastic chemotherapy: Secondary | ICD-10-CM | POA: Insufficient documentation

## 2020-12-26 LAB — CBC WITH DIFFERENTIAL/PLATELET
Abs Immature Granulocytes: 0.04 10*3/uL (ref 0.00–0.07)
Basophils Absolute: 0 10*3/uL (ref 0.0–0.1)
Basophils Relative: 1 %
Eosinophils Absolute: 0 10*3/uL (ref 0.0–0.5)
Eosinophils Relative: 1 %
HCT: 20.9 % — ABNORMAL LOW (ref 36.0–46.0)
Hemoglobin: 6.8 g/dL — CL (ref 12.0–15.0)
Immature Granulocytes: 1 %
Lymphocytes Relative: 49 %
Lymphs Abs: 1.7 10*3/uL (ref 0.7–4.0)
MCH: 28.2 pg (ref 26.0–34.0)
MCHC: 32.5 g/dL (ref 30.0–36.0)
MCV: 86.7 fL (ref 80.0–100.0)
Monocytes Absolute: 0.4 10*3/uL (ref 0.1–1.0)
Monocytes Relative: 12 %
Neutro Abs: 1.2 10*3/uL — ABNORMAL LOW (ref 1.7–7.7)
Neutrophils Relative %: 36 %
Platelets: 84 10*3/uL — ABNORMAL LOW (ref 150–400)
RBC: 2.41 MIL/uL — ABNORMAL LOW (ref 3.87–5.11)
RDW: 18 % — ABNORMAL HIGH (ref 11.5–15.5)
WBC: 3.4 10*3/uL — ABNORMAL LOW (ref 4.0–10.5)
nRBC: 2.1 % — ABNORMAL HIGH (ref 0.0–0.2)

## 2020-12-26 LAB — COMPREHENSIVE METABOLIC PANEL
ALT: 139 U/L — ABNORMAL HIGH (ref 0–44)
AST: 139 U/L — ABNORMAL HIGH (ref 15–41)
Albumin: 3.2 g/dL — ABNORMAL LOW (ref 3.5–5.0)
Alkaline Phosphatase: 171 U/L — ABNORMAL HIGH (ref 38–126)
Anion gap: 8 (ref 5–15)
BUN: 14 mg/dL (ref 6–20)
CO2: 25 mmol/L (ref 22–32)
Calcium: 8.8 mg/dL — ABNORMAL LOW (ref 8.9–10.3)
Chloride: 107 mmol/L (ref 98–111)
Creatinine, Ser: 0.99 mg/dL (ref 0.44–1.00)
GFR, Estimated: >60 mL/min (ref >60)
Glucose, Bld: 139 mg/dL — ABNORMAL HIGH (ref 70–99)
Potassium: 3.9 mmol/L (ref 3.5–5.1)
Sodium: 140 mmol/L (ref 135–145)
Total Bilirubin: 0.6 mg/dL (ref 0.3–1.2)
Total Protein: 6.8 g/dL (ref 6.5–8.1)

## 2020-12-26 LAB — SAMPLE TO BLOOD BANK

## 2020-12-26 LAB — PREPARE RBC (CROSSMATCH)

## 2020-12-26 MED ORDER — DENOSUMAB 120 MG/1.7ML ~~LOC~~ SOLN
120.0000 mg | Freq: Once | SUBCUTANEOUS | Status: AC
  Administered 2020-12-26: 120 mg via SUBCUTANEOUS

## 2020-12-26 MED ORDER — DENOSUMAB 120 MG/1.7ML ~~LOC~~ SOLN
SUBCUTANEOUS | Status: AC
  Filled 2020-12-26: qty 1.7

## 2020-12-26 MED ORDER — GOSERELIN ACETATE 3.6 MG ~~LOC~~ IMPL
3.6000 mg | DRUG_IMPLANT | Freq: Once | SUBCUTANEOUS | Status: AC
  Administered 2020-12-26: 3.6 mg via SUBCUTANEOUS

## 2020-12-26 MED ORDER — GOSERELIN ACETATE 3.6 MG ~~LOC~~ IMPL
DRUG_IMPLANT | SUBCUTANEOUS | Status: AC
  Filled 2020-12-26: qty 3.6

## 2020-12-26 MED ORDER — DARBEPOETIN ALFA 500 MCG/ML IJ SOSY
500.0000 ug | PREFILLED_SYRINGE | Freq: Once | INTRAMUSCULAR | Status: AC
  Administered 2020-12-26: 500 ug via SUBCUTANEOUS

## 2020-12-26 MED ORDER — DARBEPOETIN ALFA 500 MCG/ML IJ SOSY
PREFILLED_SYRINGE | INTRAMUSCULAR | Status: AC
  Filled 2020-12-26: qty 1

## 2020-12-26 NOTE — Progress Notes (Signed)
[place]  [phone] [phone]     ID: [name] [name] [name] DOB: 1980/06/29  MR#: [id number]  KGU#:[id number]  Patient Care Team: Townsend Roger, MD as PCP - General (Internal Medicine) Derwood Kaplan, MD as PCP - Hematology/Oncology (Oncology) Magrinat, Virgie Dad, MD as Consulting Physician (Oncology) Jacelyn Pi, MD as Referring Physician (Endocrinology) Armandina Gemma, MD as Consulting Physician (General Surgery) Servando Salina, MD as Consulting Physician (Obstetrics and Gynecology) Scot Dock, NP OTHER MD:  CHIEF COMPLAINT: metastatic breast cancer, estrogen receptor positive  CURRENT TREATMENT: letrozole, palbociclib, goserelin, denosumab   INTERVAL HISTORY: [name] [name] returns today for follow up of her metastatic breast cancer.    She has had worsening anemia.  Last week she developed increased nausea.  She went to [facility] ER and underwent CT scan of the abdomen and pelvis that showed 9m left hepatic lobe hypodensity indeterminate. No other acute process was identified. She is taking Zofran which helps some for the nausea.  She denies any blood in her stool or black/tarry stool.  She has not vomited.  She had been off of Ibrance for the past week and restarted at 75 mg yesterday.  Today her hemoglobin is 6.8.  She is fatigued.  She received Aranesp about 4 weeks ago at 5039m and tolerated that well.  She is due for another dose again today.    [place] continued on letrozole daily, and receives the xgeva and zoladex with good tolerance.  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     REVIEW OF SYSTEMS: Review of Systems  Constitutional:  Positive for fatigue. Negative for appetite change, chills, fever and unexpected weight change.  HENT:   Negative for hearing loss, lump/mass, mouth sores and trouble swallowing.   Eyes:  Negative for eye problems and icterus.  Respiratory:  Negative for chest tightness,  cough and shortness of breath.   Cardiovascular:  Negative for chest pain, leg swelling and palpitations.  Gastrointestinal:  Positive for nausea. Negative for abdominal distention, abdominal pain, constipation, diarrhea and vomiting.  Endocrine: Negative for hot flashes.  Genitourinary:  Negative for difficulty urinating.   Musculoskeletal:  Negative for arthralgias.  Skin:  Negative for itching and rash.  Neurological:  Negative for dizziness, extremity weakness, headaches and numbness.  Hematological:  Negative for adenopathy. Does not bruise/bleed easily.  Psychiatric/Behavioral:  Negative for depression. The patient is not nervous/anxious.       COVID 19 VACCINATION STATUS: Status post Moderna x3, third dose October 2021; booster pending   HISTORY OF CURRENT ILLNESS: From the [place] dated 04/10/2020 by KeDickey GavePA-C:   "[name] [name] [name] is a [age].o. female African American female with stage IV (T3 N1 M1) hormone receptor positive right breast cancer diagnosed in December 2020. We began seeing in November 2020 for leukocytosis, anemia and thrombocytopenia.  She had bruising and nose bleeds, as well as a 10-15 lb weight loss.  She had been having chronic back pain for several months, for which she was using ibuprofen or Aleve.  LDH was markedly elevated at 2599, reticulocyte count mildly elevated at 3.4% and haptoglobin was less than 10, which was consistent with hemolysis, but Coombs was negative.  CT chest, abdomen and pelvis revealed diffusely sclerotic osseous structures with multiple lytic lesions noted throughout the axial skeleton.  Some lesions demonstrated expansile soft tissue components, and these findings are highly concerning for multiple myeloma or other osseous  metastatic disease of uncertain origin.  A spiculated appearing mass or tissue element was also seen in the lateral right breast, 9 o'clock.  Bone marrow biopsy revealed abundant  metastatic carcinoma, which was estrogen receptor positive, so consistent with breast cancer origin.  With that information, it became clear that she had malignant breast cancer that had metastasized to the bone.  She underwent a digital diagnostic bilateral mammogram with right breast ultrasound in December, which revealed highly suspicious calcifications spanning at least 9.2 cm in the upper outer quadrant of the right breast with distortion associated with the posterior calcifications.  A discrete mass was seen in the right breast with ultrasound at 10 o'clock, 8 cm from the nipple measuring 9 x 6 x 7 mm.  Several mildly abnormal nodes are seen in the right axilla with cortices measuring between 3 and 5 mm.  The lymph nodes were symmetric on mammography and were not definitely different compared to 2015.  Stereotactic needle core biopsy of the right breast and right axilla confirmed grade 2, invasive ductal carcinoma, as well as ductal carcinoma in situ.  The invasive component measured 0.9 cm in greatest linear extent on the core biopsy.  Metastatic carcinoma was involved in one lymph node.  Estrogen receptor positive at 90%, progesterone receptor positive at 1%, and HER2 negative.  Ki67 was 15%.  Nuclear medicine PET scan revealed widespread hypermetabolic bony metastatic disease.  Low level hypermetabolism in bilateral axillary nodes, right greater than left was seen with uptake identified in the ill defined soft tissue lesion in the lateral right breast.  Foci of hypermetabolism identified in the central uterus along the IUD and along the anterior cervix and adjacent small bowel, which were nonspecific.  MRI head did not reveal intracranial metastasis, but diffusely abnormal bone marrow in the skull and cervical spine likely due to metastatic disease was observed.  CA 27-29 was not elevated.   Due to her age of diagnosis, she underwent testing for hereditary breast cancer with the [employer]  myRisk hereditary  cancer panel test.  This did not reveal any clinically significant mutation.  There were variants of uncertain significance of the ATM, AXIN2 and MSH3 genes.   She had severer pain, so was placed on methadone, in addition to oxycodone and the doses were titrated up.  We recommended letrozole/palbociclib, denosumab monthly for the bone metastasis and goserelin monthly to suppress ovarian function due to being premenopausal.  She started letrozole on December 28th and denosumab and goserelin on January 7th.  She started palbociclib for 3 weeks on and one week off on January 9th.   She develops severe hypocalcemia, which required IV calcium replacement. Denosumab had to be held due to the severe hypocalcemia, but was resumed in March.  She was placed on high doses of oral calcium.  She had multiple other issues, such as decreased appetite, nausea and vomiting, as well  As dehydration, managed with medications. MRI thoracic and lumbar spine on January 19th revealed diffusely decreased T1 bone marrow signal with fairly homogeneous enhancement throughout the thoracolumbar spine.  Prior PET-CT demonstrated homogeneous hypermetabolic activity throughout the thoracolumbar spine.  These findings are favored to be related to the patient's anemia rather than diffuse metastatic disease.  There were possible small osseous metastases, notably within the T3 spinous process, left L2 pedicle and upper right sacrum.  There was no significant disc herniation, spinal stenosis or nerve root encroachment.  She was subsequently weaned off methadone and has not had recurrent pain.  She developed cytopenias due to palbociclib and we occasionally had to delay cycles   CT chest, abdomen and pelvis in May revealed diffuse patchy confluent sclerotic osseous metastatic disease throughout the axial and proximal appendicular skeleton, probably representing treatment effect.  ThereWere healed deformities throughout the bilateral ribs.  A new  mild right axillary lymphadenopathy was also observed, which wasnonspecific.  No additional sites of metastatic disease were seen in the chest, abdomen or pelvis.  She had a  tubal ligation and removal of her Mirena IUD in June.  CT chest, abdomen and pelvis in September revealed again widespread osseous sclerosis, indicative of treated metastatic disease.  The previous borderline enlarged right axillary lymph nodes had resolved.  She had recurrent cytopenias, and since we had to delay multiple cycles of palbociclib, we decreased her palbociclib dose to 100 mg daily for 3 weeks on a week off."  The patient's subsequent history is as detailed below.   PAST MEDICAL HISTORY: Past Medical History:  Diagnosis Date   Anemia    History of cardiac murmur as a child    History of Graves' disease    dx hyperthyroidism during 4th pregnancy;   11-15-2013  s/p  total thyroidectomy   Hypothyroidism, postsurgical 11/15/2013   endocrinologist--- dr balan   Pre-diabetes    Stage IV breast cancer in female [facility]) 05/2019   oncologist-- dr c. Hinton Rao ( cancer center) bone marrow bx 05-08-2019 and right breast bx 05-17-2020--- primary breast w/ mets to bones, pt taking oral chemo (ibrance)  Tumor associated hemolysis at presentation December 2020   PAST SURGICAL HISTORY: Past Surgical History:  Procedure Laterality Date   IUD REMOVAL N/A 11/22/2019   Procedure: INTRAUTERINE DEVICE (IUD) REMOVAL;  Surgeon: Servando Salina, MD;  Location: [place];  Service: Gynecology;  Laterality: N/A;   LAPAROSCOPIC TUBAL LIGATION Bilateral 11/22/2019   Procedure: LAPAROSCOPIC TUBAL LIGATION By Cautery;  Surgeon: Servando Salina, MD;  Location: [place];  Service: Gynecology;  Laterality: Bilateral;   THYROIDECTOMY Bilateral 11/15/2013   Procedure: TOTAL THYROIDECTOMY;  Surgeon: Earnstine Regal, MD;  Location: [facility];  Service: General;  Laterality: Bilateral;   WISDOM TOOTH  EXTRACTION  2012    FAMILY HISTORY: Family History  Problem Relation Age of Onset   Cancer Mother        Peripheral T cell Lymphoma   Diabetes Father    Diabetes Maternal Grandmother   The patient's mother died at age 75 shortly after her diagnosis of T-cell non-Hodgkin's lymphoma. The patient's father is 70 years old as of December 2020. He has a history of diabetes, is a bilateral amputee, and has a history of EtOH abuse. The patient has 2 half siblings on her mother side and 3/2 siblings on her father's side. None of them have a history of cancer. She has a maternal cousin with a history of B-cell non-Hodgkin's lymphoma and a maternal grandfather with prostate cancer   GYNECOLOGIC HISTORY:  Patient's last menstrual period was 10/26/2018. Menarche: 40 years old Age at first live birth: 40 years old [place] P 4 LMP December 2020 (when goserelin started Contraceptive: Status post bilateral tubal ligation HRT n/a  Hysterectomy? no BSO? no   SOCIAL HISTORY: (updated 04/2020)  [name] [name] is divorced. Her former husband Franshesca Chipman is a Armed forces technical officer for the [employer]. 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  She works on Friday Saturdays and Sundays only.  Her daughters were called [name], [name], Faith, and [place], age 68-9  as of December 2021. They stay 1 week with the patient in 1 week with her ex-husband. The patient also has a burn doodle called Karlene Lineman. The patient is a Psychologist, forensic    ADVANCED DIRECTIVES: Not in place. At the 05/06/2020 visit the patient was given the appropriate documents to complete and notarized at her discretion.   HEALTH MAINTENANCE: Social History   Tobacco Use   Smoking status: Never   Smokeless tobacco: Never  Vaping Use   Vaping Use: Never used  Substance Use Topics   Alcohol use: Not Currently    Comment: social   Drug use: Never     Colonoscopy: n/a (age)  PAP: Up-to-date  Bone density: Remote   No Known Allergies  Current Outpatient  Medications  Medication Sig Dispense Refill   b complex vitamins capsule Take 1 capsule by mouth daily.     Calcium Carb-Cholecalciferol (CALCIUM 600 + D PO) Take 4 tablets by mouth daily.     Cholecalciferol (VITAMIN D3) 25 MCG (1000 UT) CAPS Take 3 capsules by mouth daily.     Denosumab (XGEVA [place]) Inject into the skin every 30 (thirty) days.     gabapentin (NEURONTIN) 100 MG capsule Take 1 capsule (100 mg total) by mouth at bedtime. 90 capsule 4   Goserelin Acetate (ZOLADEX [place]) Inject into the skin every 30 (thirty) days.     ibuprofen (ADVIL) 800 MG tablet Take 1 tablet (800 mg total) by mouth every 8 (eight) hours as needed for moderate pain. 30 tablet 5   letrozole (FEMARA) 2.5 MG tablet Take 1 tablet (2.5 mg total) by mouth daily. 90 tablet 4   levothyroxine (SYNTHROID) 125 MCG tablet Take 125 mcg by mouth every morning.      metFORMIN (GLUCOPHAGE) 500 MG tablet Take 1 tablet (500 mg total) by mouth 2 (two) times daily with a meal.     ondansetron (ZOFRAN) 8 MG tablet TAKE 1 TABLET (8 MG TOTAL) BY MOUTH 3 (THREE) TIMES DAILY AS NEEDED FOR NAUSEA OR VOMITING. 18 tablet 1   palbociclib (IBRANCE) 75 MG capsule Take 1 capsule (75 mg total) by mouth daily with breakfast. Take whole with food. Take for 21 days on, 7 days off, repeat every 28 days. 21 capsule 6   valACYclovir (VALTREX) 500 MG tablet Take 1 tablet (500 mg total) by mouth daily. 90 tablet 4   No current facility-administered medications for this visit.    OBJECTIVE: African-American woman who appears well  Vitals:   12/26/20 1409  BP: 128/64  Pulse: 97  Resp: 19  Temp: (!) 97.5 F (36.4 C)  SpO2: 98%     Body mass index is 31.55 kg/m.   Wt Readings from Last 3 Encounters:  12/26/20 189 lb 9.6 oz (86 kg)  11/27/20 196 lb 12.8 oz (89.3 kg)  08/28/20 202 lb 8 oz (91.9 kg)      ECOG FS:1 - Symptomatic but completely ambulatory GENERAL: Patient is a well appearing female in no acute distress HEENT:  Sclerae anicteric.   Oropharynx clear and moist. No ulcerations or evidence of oropharyngeal candidiasis. Neck is supple.  NODES:  No cervical, supraclavicular, or axillary lymphadenopathy palpated.  BREAST EXAM:  Deferred. LUNGS:  Clear to auscultation bilaterally.  No wheezes or rhonchi. HEART:  Regular rate and rhythm. No murmur appreciated. ABDOMEN:  Soft, nontender.  Positive, normoactive bowel sounds. No organomegaly palpated. MSK:  No focal spinal tenderness to palpation. Full range of motion bilaterally in the upper extremities. EXTREMITIES:  No peripheral edema.   SKIN:  Clear with no obvious rashes or skin changes. No nail dyscrasia. NEURO:  Nonfocal. Well oriented.  Appropriate affect.    LAB RESULTS:  CMP     Component Value Date/Time   NA 137 11/27/2020 1140   NA 140 04/16/2020 0000   K 3.8 11/27/2020 1140   CL 106 11/27/2020 1140   CO2 23 11/27/2020 1140   GLUCOSE 83 11/27/2020 1140   BUN 12 11/27/2020 1140   BUN 18 04/16/2020 0000   CREATININE 0.72 11/27/2020 1140   CREATININE 0.93 11/11/2020 1034   CALCIUM 9.6 11/27/2020 1140   PROT 7.5 11/27/2020 1140   ALBUMIN 3.5 11/27/2020 1140   AST 88 (H) 11/27/2020 1140   AST 38 11/11/2020 1034   ALT 99 (H) 11/27/2020 1140   ALT 33 11/11/2020 1034   ALKPHOS 122 11/27/2020 1140   BILITOT 0.3 11/27/2020 1140   BILITOT 0.3 11/11/2020 1034   GFRNONAA >60 11/27/2020 1140   GFRNONAA >60 11/11/2020 1034   GFRAA >60 04/28/2019 1218    No results found for: TOTALPROTELP, ALBUMINELP, A1GS, A2GS, BETS, BETA2SER, GAMS, MSPIKE, SPEI  Lab Results  Component Value Date   WBC 3.4 (L) 11/27/2020   NEUTROABS 1.1 (L) 11/27/2020   HGB 9.8 (L) 11/27/2020   HCT 29.5 (L) 11/27/2020   MCV 86.5 11/27/2020   PLT 99 (L) 11/27/2020    No results found for: LABCA2  No components found for: MEQAST419  No results for input(s): INR in the last 168 hours.  No results found for: LABCA2  No results found for: QQI297  No results found for: LGX211  No  results found for: HER740  Lab Results  Component Value Date   CA2729 16.2 11/27/2020    No components found for: HGQUANT  No results found for: CEA1 / No results found for: CEA1   No results found for: AFPTUMOR  No results found for: CHROMOGRNA  No results found for: KPAFRELGTCHN, LAMBDASER, KAPLAMBRATIO (kappa/lambda light chains)  No results found for: HGBA, HGBA2QUANT, HGBFQUANT, HGBSQUAN (Hemoglobinopathy evaluation)   Lab Results  Component Value Date   LDH 317 (H) 05/06/2020    No results found for: IRON, TIBC, IRONPCTSAT (Iron and TIBC)  Lab Results  Component Value Date   FERRITIN 99 05/06/2020    Urinalysis    Component Value Date/Time   COLORURINE YELLOW 04/28/2019 1223   APPEARANCEUR HAZY (A) 04/28/2019 1223   LABSPEC 1.024 04/28/2019 1223   PHURINE 5.0 04/28/2019 [place] 04/28/2019 1223   HGBUR NEGATIVE 04/28/2019 [place] 04/28/2019 [place] 04/28/2019 [place] 04/28/2019 1223   NITRITE NEGATIVE 04/28/2019 [place] 04/28/2019 1223    STUDIES: No results found.   ELIGIBLE FOR AVAILABLE RESEARCH PROTOCOL: no  ASSESSMENT: 40 y.o. [place] woman presenting NOV 2020 with stage IV breast cancer as follows:  (a) workup of initial pancytopenia and hemolysis showed a leukoerythroblastic blood picture with bone marrow biopsy 05/07/2020 confirming metastatic carcinoma, estrogen receptor positive; cytogenetics were normal  (b) CT scans at baseline showing multiple bone lesions (both sclerotic and lytic) and a possible mass in the right breast; no definitive lung or liver involvement  (c) right breast upper outer quadrant biopsy x2 and right axillary lymph node biopsy on 05/18/2019 confirmed a clinical 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, grade 1-2, estrogen receptor strongly positive, progesterone receptor moderately positive (1%), HER-2 negative, with an  MIB-1 of 2-15%  (d) CA 27-29 was not informative (repeat 05/06/2020 WNL)  (1) letrozole started 05/28/2019  (a) goserelin started 06/07/2019, repeated every 4 weeks  (b) palbociclib added 06/09/2019  (c) palbociclib dose decreased to 100 mg daily, 21/7, with September 2021 cycle  (2) denosumab/Xgeva started 06/07/2019, repeated every 4 weeks  (3) genetics testing through the [employer] MyRisk panel dated 09/22/2019 found no deleterious mutations in the genes tested which included BRCA 1 and 2, CHEK2, PALB 2 and TP53 among others.  (a) variants of uncertain significance were found in ATM, AXN 2, and [place] 3.  (4) restaging studies:  (a) chest CT and bone scan 05/21/2020 shows no visceral disease, stable bone lesions  (b) bilateral mammography and right breast ultrasonography 06/25/2020 shows the measurable disease in the right breast to have decreased.  There is some progressive calcifications that require monitoring  (c) bone scan 11/21/2020 shows no new bone lesions  (d) breast ultrasound 11/12/2020 shows the previously noted 10:00 mass to have resolved.  The right axillary adenopathy also has resolved.  There is some increase in pleomorphic calcifications as previously noted  (5) chemotherapy associated anemia, requiring transfusion:  (a) EPO started 11/27/2020   PLAN: [name] [name] is here today for follow up of her metastatic breast cancer.  She has worsening cytopenias.  Her hemoglobin has dropped to 6.8 today.  She will receive one unit of blood tomorrow.  I stopped her Palbociclib.  She will continue to take Letrozole, Zoladex, and Xgeva.  She will receive the EPO today.  I also asked my nurse to give her stool cards to r/o GI source of her anemia.    The other potential cuase of the anemia is that her cancer is progressing.  I sent Dr. Jana Hakim a message about this since he is out of the office, and reviewed her worsening labs and CT results.  When he returns on Monday we will discuss further  what his thoughts are and next steps.    [name] [name] [name]'s next appt is in 4 weeks, however, I will touch base with Dr. Jana Hakim and reach out to her as to what our next steps are.  She knows to call for any questions that may arise between now and her next appointment.  We are happy to see her sooner if needed.   Total encounter time 31 minutes.* in face to face visit time, chart review, lab review, care coordination, order entry, and documentation of the encounter.   Wilber Bihari, NP 12/26/20 2:26 PM Medical Oncology and Hematology [facility] [place], [place] [address] Tel. [phone]    Fax. [phone]   *Total Encounter Time as defined by the Centers for Medicare and Medicaid Services includes, in addition to the face-to-face time of a patient visit (documented in the note above) non-face-to-face time: obtaining and reviewing outside history, ordering and reviewing medications, tests or procedures, care coordination (communications with other health care professionals or caregivers) and documentation in the medical record.

## 2020-12-26 NOTE — Patient Instructions (Signed)
Denosumab injection What is this medication? DENOSUMAB (den oh sue mab) slows bone breakdown. Prolia is used to treat osteoporosis in women after menopause and in men, and in people who are taking corticosteroids for 6 months or more. Delton See is used to treat a high calcium level due to cancer and to prevent bone fractures and other bone problems caused by multiple myeloma or cancer bone metastases. Delton See is also used totreat giant cell tumor of the bone. This medicine may be used for other purposes; ask your health care provider orpharmacist if you have questions. COMMON BRAND NAME(S): Prolia, XGEVA What should I tell my care team before I take this medication? They need to know if you have any of these conditions: dental disease having surgery or tooth extraction infection kidney disease low levels of calcium or Vitamin D in the blood malnutrition on hemodialysis skin conditions or sensitivity thyroid or parathyroid disease an unusual reaction to denosumab, other medicines, foods, dyes, or preservatives pregnant or trying to get pregnant breast-feeding How should I use this medication? This medicine is for injection under the skin. It is given by a health careprofessional in a hospital or clinic setting. A special MedGuide will be given to you before each treatment. Be sure to readthis information carefully each time. For Prolia, talk to your pediatrician regarding the use of this medicine in children. Special care may be needed. For Delton See, talk to your pediatrician regarding the use of this medicine in children. While this drug may be prescribed for children as young as 13 years for selected conditions,precautions do apply. Overdosage: If you think you have taken too much of this medicine contact apoison control center or emergency room at once. NOTE: This medicine is only for you. Do not share this medicine with others. What if I miss a dose? It is important not to miss your dose. Call  your doctor or health careprofessional if you are unable to keep an appointment. What may interact with this medication? Do not take this medicine with any of the following medications: other medicines containing denosumab This medicine may also interact with the following medications: medicines that lower your chance of fighting infection steroid medicines like prednisone or cortisone This list may not describe all possible interactions. Give your health care provider a list of all the medicines, herbs, non-prescription drugs, or dietary supplements you use. Also tell them if you smoke, drink alcohol, or use illegaldrugs. Some items may interact with your medicine. What should I watch for while using this medication? Visit your doctor or health care professional for regular checks on your progress. Your doctor or health care professional may order blood tests andother tests to see how you are doing. Call your doctor or health care professional for advice if you get a fever, chills or sore throat, or other symptoms of a cold or flu. Do not treat yourself. This drug may decrease your body's ability to fight infection. Try toavoid being around people who are sick. You should make sure you get enough calcium and vitamin D while you are taking this medicine, unless your doctor tells you not to. Discuss the foods you eatand the vitamins you take with your health care professional. See your dentist regularly. Brush and floss your teeth as directed. Before youhave any dental work done, tell your dentist you are receiving this medicine. Do not become pregnant while taking this medicine or for 5 months after stopping it. Talk with your doctor or health care professional about your  birth control options while taking this medicine. Women should inform their doctor if they wish to become pregnant or think they might be pregnant. There is a potential for serious side effects to an unborn child. Talk to your health  careprofessional or pharmacist for more information. What side effects may I notice from receiving this medication? Side effects that you should report to your doctor or health care professionalas soon as possible: allergic reactions like skin rash, itching or hives, swelling of the face, lips, or tongue bone pain breathing problems dizziness jaw pain, especially after dental work redness, blistering, peeling of the skin signs and symptoms of infection like fever or chills; cough; sore throat; pain or trouble passing urine signs of low calcium like fast heartbeat, muscle cramps or muscle pain; pain, tingling, numbness in the hands or feet; seizures unusual bleeding or bruising unusually weak or tired Side effects that usually do not require medical attention (report to yourdoctor or health care professional if they continue or are bothersome): constipation diarrhea headache joint pain loss of appetite muscle pain runny nose tiredness upset stomach This list may not describe all possible side effects. Call your doctor for medical advice about side effects. You may report side effects to FDA at1-800-FDA-1088. Where should I keep my medication? This medicine is only given in a clinic, doctor's office, or other health caresetting and will not be stored at home. NOTE: This sheet is a summary. It may not cover all possible information. If you have questions about this medicine, talk to your doctor, pharmacist, orhealth care provider.  2022 Elsevier/Gold Standard (2017-09-23 16:10:44)  Darbepoetin Alfa injection What is this medication? DARBEPOETIN ALFA (dar be POE e tin AL fa) helps your body make more red blood cells. It is used to treat anemia caused by chronic kidney failure andchemotherapy. This medicine may be used for other purposes; ask your health care provider orpharmacist if you have questions. COMMON BRAND NAME(S): Aranesp What should I tell my care team before I take this  medication? They need to know if you have any of these conditions: blood clotting disorders or history of blood clots cancer patient not on chemotherapy cystic fibrosis heart disease, such as angina, heart failure, or a history of a heart attack hemoglobin level of 12 g/dL or greater high blood pressure low levels of folate, iron, or vitamin B12 seizures an unusual or allergic reaction to darbepoetin, erythropoietin, albumin, hamster proteins, latex, other medicines, foods, dyes, or preservatives pregnant or trying to get pregnant breast-feeding How should I use this medication? This medicine is for injection into a vein or under the skin. It is [place] by a health care professional in a hospital or clinic setting. If you get this medicine at home, you will be taught how to prepare and give this medicine. Use exactly as directed. Take your medicine at regularintervals. Do not take your medicine more often than directed. It is important that you put your used needles and syringes in a special sharps container. Do not put them in a trash can. If you do not have a sharpscontainer, call your pharmacist or healthcare provider to get one. A special MedGuide will be given to you by the pharmacist with eachprescription and refill. Be sure to read this information carefully each time. Talk to your pediatrician regarding the use of this medicine in children. While this medicine may be used in children as young as 4 month of age for selectedconditions, precautions do apply. Overdosage: If you think you  have taken too much of this medicine contact apoison control center or emergency room at once. NOTE: This medicine is only for you. Do not share this medicine with others. What if I miss a dose? If you miss a dose, take it as soon as you can. If it is almost time for yournext dose, take only that dose. Do not take double or extra doses. What may interact with this medication? Do not take this  medicine with any of the following medications: epoetin alfa This list may not describe all possible interactions. Give your health care provider a list of all the medicines, herbs, non-prescription drugs, or dietary supplements you use. Also tell them if you smoke, drink alcohol, or use illegaldrugs. Some items may interact with your medicine. What should I watch for while using this medication? Your condition will be monitored carefully while you are receiving thismedicine. You may need blood work done while you are taking this medicine. This medicine may cause a decrease in vitamin B6. You should make sure that you get enough vitamin B6 while you are taking this medicine. Discuss the foods youeat and the vitamins you take with your health care professional. What side effects may I notice from receiving this medication? Side effects that you should report to your doctor or health care professionalas soon as possible: allergic reactions like skin rash, itching or hives, swelling of the face, lips, or tongue breathing problems changes in vision chest pain confusion, trouble speaking or understanding feeling faint or lightheaded, falls high blood pressure muscle aches or pains pain, swelling, warmth in the leg rapid weight gain severe headaches sudden numbness or weakness of the face, arm or leg trouble walking, dizziness, loss of balance or coordination seizures (convulsions) swelling of the ankles, feet, hands unusually weak or tired Side effects that usually do not require medical attention (report to yourdoctor or health care professional if they continue or are bothersome): diarrhea fever, chills (flu-like symptoms) headaches nausea, vomiting redness, stinging, or swelling at site where injected This list may not describe all possible side effects. Call your doctor for medical advice about side effects. You may report side effects to FDA at1-800-FDA-1088. Where should I keep my  medication? Keep out of the reach of children. Store in a refrigerator between 2 and 8 degrees C (36 and 46 degrees F). Do not freeze. Do not shake. Throw away any unused portion if using a single-dosevial. Throw away any unused medicine after the expiration date. NOTE: This sheet is a summary. It may not cover all possible information. If you have questions about this medicine, talk to your doctor, pharmacist, orhealth care provider.  2022 Elsevier/Gold Standard (2017-06-01 16:44:20)  Goserelin injection What is this medication? GOSERELIN (GOE se rel in) is similar to a hormone found in the body. It lowers the amount of sex hormones that the body makes. Men will have lower testosterone levels and women will have lower estrogen levels while taking this medicine. In men, this medicine is used to treat prostate cancer; the injection is either given once per month or once every 12 weeks. A once per month injection (only) is used to treat women with endometriosis, dysfunctionaluterine bleeding, or advanced breast cancer. This medicine may be used for other purposes; ask your health care provider orpharmacist if you have questions. COMMON BRAND NAME(S): Zoladex What should I tell my care team before I take this medication? They need to know if you have any of these conditions: bone problems diabetes heart  disease history of irregular heartbeat an unusual or allergic reaction to goserelin, other medicines, foods, dyes, or preservatives pregnant or trying to get pregnant breast-feeding How should I use this medication? This medicine is for injection under the skin. It is given by a health careprofessional in a hospital or clinic setting. Talk to your pediatrician regarding the use of this medicine in children.Special care may be needed. Overdosage: If you think you have taken too much of this medicine contact apoison control center or emergency room at once. NOTE: This medicine is only for you. Do  not share this medicine with others. What if I miss a dose? It is important not to miss your dose. Call your doctor or health careprofessional if you are unable to keep an appointment. What may interact with this medication? Do not take this medicine with any of the following medications: cisapride dronedarone pimozide thioridazine This medicine may also interact with the following medications: other medicines that prolong the QT interval (an abnormal heart rhythm) This list may not describe all possible interactions. Give your health care provider a list of all the medicines, herbs, non-prescription drugs, or dietary supplements you use. Also tell them if you smoke, drink alcohol, or use illegaldrugs. Some items may interact with your medicine. What should I watch for while using this medication? Visit your doctor or health care provider for regular checks on your progress. Your symptoms may appear to get worse during the first weeks of this therapy. Tell your doctor or healthcare provider if your symptoms do not start to getbetter or if they get worse after this time. Your bones may get weaker if you take this medicine for a long time. If you smoke or frequently drink alcohol you may increase your risk of bone loss. A family history of osteoporosis, chronic use of drugs for seizures (convulsions), or corticosteroids can also increase your risk of bone loss.Talk to your doctor about how to keep your bones strong. This medicine should stop regular monthly menstruation in women. Tell yourdoctor if you continue to menstruate. Women should not become pregnant while taking this medicine or for 12 weeks after stopping this medicine. Women should inform their doctor if they wish to become pregnant or think they might be pregnant. There is a potential for serious side effects to an unborn child. Talk to your health care professional or pharmacist for more information. Do not breast-feed an infant while  takingthis medicine. Men should inform their doctors if they wish to father a child. This medicine may lower sperm counts. Talk to your health care professional or pharmacist formore information. This medicine may increase blood sugar. Ask your healthcare provider if changesin diet or medicines are needed if you have diabetes. What side effects may I notice from receiving this medication? Side effects that you should report to your doctor or health care professionalas soon as possible: allergic reactions like skin rash, itching or hives, swelling of the face, lips, or tongue bone pain breathing problems changes in vision chest pain feeling faint or lightheaded, falls fever, chills pain, swelling, warmth in the leg pain, tingling, numbness in the hands or feet signs and symptoms of high blood sugar such as being more thirsty or hungry or having to urinate more than normal. You may also feel very tired or have blurry vision signs and symptoms of low blood pressure like dizziness; feeling faint or lightheaded, falls; unusually weak or tired stomach pain swelling of the ankles, feet, hands trouble passing urine or  change in the amount of urine unusually high or low blood pressure unusually weak or tired Side effects that usually do not require medical attention (report to yourdoctor or health care professional if they continue or are bothersome): change in sex drive or performance changes in breast size in both males and females changes in emotions or moods headache hot flashes irritation at site where injected loss of appetite skin problems like acne, dry skin vaginal dryness This list may not describe all possible side effects. Call your doctor for medical advice about side effects. You may report side effects to FDA at1-800-FDA-1088. Where should I keep my medication? This drug is given in a hospital or clinic and will not be stored at home. NOTE: This sheet is a summary. It may not  cover all possible information. If you have questions about this medicine, talk to your doctor, pharmacist, orhealth care provider.  2022 Elsevier/Gold Standard (2018-09-04 14:05:56)

## 2020-12-26 NOTE — Progress Notes (Signed)
CRITICAL VALUE STICKER  CRITICAL VALUE: Hgb 6.8  MD NOTIFIED: Wilber Bihari, NP   RESPONSE:  Patient will receive 1 units of PRBC's on 7/30 @ 9am.  Orders placed for occult blood card per Wilber Bihari, NP.

## 2020-12-27 ENCOUNTER — Inpatient Hospital Stay: Payer: Commercial Managed Care - PPO

## 2020-12-27 ENCOUNTER — Other Ambulatory Visit: Payer: Self-pay

## 2020-12-27 DIAGNOSIS — Z5111 Encounter for antineoplastic chemotherapy: Secondary | ICD-10-CM | POA: Diagnosis not present

## 2020-12-27 DIAGNOSIS — D649 Anemia, unspecified: Secondary | ICD-10-CM

## 2020-12-27 MED ORDER — ACETAMINOPHEN 160 MG/5ML PO SOLN
ORAL | Status: AC
  Filled 2020-12-27: qty 20.3

## 2020-12-27 MED ORDER — DIPHENHYDRAMINE HCL 25 MG PO CAPS
25.0000 mg | ORAL_CAPSULE | Freq: Once | ORAL | Status: AC
Start: 2020-12-27 — End: 2020-12-27
  Administered 2020-12-27: 25 mg via ORAL

## 2020-12-27 MED ORDER — SODIUM CHLORIDE 0.9% FLUSH
10.0000 mL | INTRAVENOUS | Status: AC | PRN
  Administered 2020-12-27: 10 mL
  Filled 2020-12-27: qty 10

## 2020-12-27 MED ORDER — ACETAMINOPHEN 325 MG PO TABS
ORAL_TABLET | ORAL | Status: AC
  Filled 2020-12-27: qty 2

## 2020-12-27 MED ORDER — DIPHENHYDRAMINE HCL 25 MG PO CAPS
ORAL_CAPSULE | ORAL | Status: AC
  Filled 2020-12-27: qty 1

## 2020-12-27 MED ORDER — SODIUM CHLORIDE 0.9% IV SOLUTION
250.0000 mL | Freq: Once | INTRAVENOUS | Status: AC
  Administered 2020-12-27: 250 mL via INTRAVENOUS
  Filled 2020-12-27: qty 250

## 2020-12-27 MED ORDER — ACETAMINOPHEN 325 MG PO TABS
650.0000 mg | ORAL_TABLET | Freq: Once | ORAL | Status: AC
  Administered 2020-12-27: 650 mg via ORAL

## 2020-12-28 ENCOUNTER — Encounter: Payer: Self-pay | Admitting: Oncology

## 2020-12-29 ENCOUNTER — Other Ambulatory Visit: Payer: Self-pay | Admitting: Oncology

## 2020-12-29 DIAGNOSIS — D582 Other hemoglobinopathies: Secondary | ICD-10-CM

## 2020-12-29 DIAGNOSIS — Z170 Estrogen receptor positive status [ER+]: Secondary | ICD-10-CM

## 2020-12-29 DIAGNOSIS — C50411 Malignant neoplasm of upper-outer quadrant of right female breast: Secondary | ICD-10-CM

## 2020-12-29 DIAGNOSIS — C7951 Secondary malignant neoplasm of bone: Secondary | ICD-10-CM

## 2020-12-29 LAB — TYPE AND SCREEN
ABO/RH(D): A POS
Antibody Screen: NEGATIVE
Unit division: 0

## 2020-12-29 LAB — BPAM RBC
Blood Product Expiration Date: 202208192359
ISSUE DATE / TIME: 202207300948
Unit Type and Rh: 6200

## 2020-12-29 NOTE — Progress Notes (Signed)
This is a note from my APP Thedore Mins dated 12/26/2020  [name] [name] (01/14/1981): Went to [facility] in care everywhere on 12/18/2020 and had CT that showed 49m indeterminate ? liver met.  Her anemia is worse today, was 8.2 last week and today is 6.8--had been off of ibrance during that time and just restarted yesterday.  Platelets are also decreased from - 99 to 84.  [facility] disease progression in the bone and now ? liver?    Most recent summary:    40y.o. [place] woman presenting NOV 2020 with stage IV breast cancer as follows:             (a) workup of initial pancytopenia and hemolysis showed a leukoerythroblastic blood picture with bone marrow biopsy 05/07/2020 confirming metastatic carcinoma, estrogen receptor positive; cytogenetics were normal             (b) CT scans at baseline showing multiple bone lesions (both sclerotic and lytic) and a possible mass in the right breast; no definitive lung or liver involvement             (c) right breast upper outer quadrant biopsy x2 and right axillary lymph node biopsy on 05/18/2019 confirmed a clinical 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, grade 1-2, estrogen receptor strongly positive, progesterone receptor moderately positive (1%), HER-2 negative, with an MIB-1 of 2-15%             (d) CA 27-29 was not informative (repeat 05/06/2020 WNL)   (1) letrozole started 05/28/2019             (a) goserelin started 06/07/2019, repeated every 4 weeks             (b) palbociclib added 06/09/2019             (c) palbociclib dose decreased to 100 mg daily, 21/7, with September 2021 cycle   (2) denosumab/Xgeva started 06/07/2019, repeated every 4 weeks   (3) genetics testing through the [facility] panel dated 09/22/2019 found no deleterious mutations in the genes tested which included BRCA 1 and 2, CHEK2, PALB 2 and TP53 among others.             (a) variants of uncertain significance were  found in ATM, AXN 2, and MSeneca3.   (4) restaging studies:             (a) chest CT and bone scan 05/21/2020 shows no visceral disease, stable bone lesions             (b) bilateral mammography and right breast ultrasonography 06/25/2020 shows the measurable disease in the right breast to have decreased.  There is some progressive calcifications that require monitoring             (c) bone scan 11/21/2020 shows no new bone lesions             (d) breast ultrasound 11/12/2020 shows the previously noted 10:00 mass to have resolved.  The right axillary adenopathy also has resolved.  There is some increase in pleomorphic calcifications as previously noted   (5) tumor and chemotherapy associated anemia, requiring transfusion:  (a) EPO started 11/27/2020   Impression/plan: I called KMerry Loftyand discussed the situation with her.  She understands the cancer in her bone marrow does not let her bone marrow function normally.  This causes low counts.  Of course the Ibrance can also cause low counts  and it is very difficult to sort this out.  She would like to be off the [place] for a while and I agree so we can assess this better.  I am setting her up for an MRI of the liver, some lab work to work-up the anemia, and then to see [place] again on 01/08/2021.  At that point we can review treatment, consider Verzenio versus palbociclib, or completely change her treatment plan.

## 2020-12-30 ENCOUNTER — Telehealth: Payer: Self-pay | Admitting: Adult Health

## 2020-12-30 NOTE — Telephone Encounter (Signed)
Scheduled apps per 8/1 sch msg. Pt is aware.

## 2021-01-05 ENCOUNTER — Ambulatory Visit (HOSPITAL_COMMUNITY): Payer: Commercial Managed Care - PPO

## 2021-01-05 ENCOUNTER — Other Ambulatory Visit: Payer: Self-pay

## 2021-01-05 MED ORDER — DEXAMETHASONE 4 MG PO TABS: 4.0000 mg | ORAL_TABLET | Freq: Two times a day (BID) | ORAL | 0 refills | Status: DC

## 2021-01-05 NOTE — Progress Notes (Signed)
Pt called and states she has had N/V which has not improved, is not able to hold food down. Pt reports she has 3 emesis episodes 01/04/21. Pt also reports Zofran & Reglan is not helping. Per Dr Jana Hakim, pt should take Decadron 4 mg 1 tab PO BID with food until she can see Wilber Bihari, NP 8/11.Pt made aware and verbalized thanks and understanding.

## 2021-01-06 ENCOUNTER — Other Ambulatory Visit: Payer: Self-pay

## 2021-01-06 ENCOUNTER — Inpatient Hospital Stay: Payer: Commercial Managed Care - PPO | Attending: Oncology

## 2021-01-06 DIAGNOSIS — E0500 Thyrotoxicosis with diffuse goiter without thyrotoxic crisis or storm: Secondary | ICD-10-CM

## 2021-01-06 DIAGNOSIS — Z5111 Encounter for antineoplastic chemotherapy: Secondary | ICD-10-CM | POA: Diagnosis not present

## 2021-01-06 DIAGNOSIS — Z807 Family history of other malignant neoplasms of lymphoid, hematopoietic and related tissues: Secondary | ICD-10-CM | POA: Diagnosis not present

## 2021-01-06 DIAGNOSIS — C7951 Secondary malignant neoplasm of bone: Secondary | ICD-10-CM | POA: Diagnosis not present

## 2021-01-06 DIAGNOSIS — D582 Other hemoglobinopathies: Secondary | ICD-10-CM

## 2021-01-06 DIAGNOSIS — D6481 Anemia due to antineoplastic chemotherapy: Secondary | ICD-10-CM | POA: Diagnosis present

## 2021-01-06 DIAGNOSIS — D5919 Other autoimmune hemolytic anemia: Secondary | ICD-10-CM

## 2021-01-06 DIAGNOSIS — C50411 Malignant neoplasm of upper-outer quadrant of right female breast: Secondary | ICD-10-CM | POA: Diagnosis present

## 2021-01-06 DIAGNOSIS — C787 Secondary malignant neoplasm of liver and intrahepatic bile duct: Secondary | ICD-10-CM | POA: Insufficient documentation

## 2021-01-06 DIAGNOSIS — M549 Dorsalgia, unspecified: Secondary | ICD-10-CM | POA: Insufficient documentation

## 2021-01-06 DIAGNOSIS — Z170 Estrogen receptor positive status [ER+]: Secondary | ICD-10-CM | POA: Diagnosis not present

## 2021-01-06 DIAGNOSIS — G8929 Other chronic pain: Secondary | ICD-10-CM | POA: Insufficient documentation

## 2021-01-06 DIAGNOSIS — C50811 Malignant neoplasm of overlapping sites of right female breast: Secondary | ICD-10-CM

## 2021-01-06 DIAGNOSIS — R112 Nausea with vomiting, unspecified: Secondary | ICD-10-CM | POA: Diagnosis not present

## 2021-01-06 LAB — CBC WITH DIFFERENTIAL/PLATELET
Abs Immature Granulocytes: 0.17 10*3/uL — ABNORMAL HIGH (ref 0.00–0.07)
Basophils Absolute: 0.1 10*3/uL (ref 0.0–0.1)
Basophils Relative: 1 %
Eosinophils Absolute: 0 10*3/uL (ref 0.0–0.5)
Eosinophils Relative: 1 %
HCT: 24.8 % — ABNORMAL LOW (ref 36.0–46.0)
Hemoglobin: 8 g/dL — ABNORMAL LOW (ref 12.0–15.0)
Immature Granulocytes: 3 %
Lymphocytes Relative: 35 %
Lymphs Abs: 2.4 10*3/uL (ref 0.7–4.0)
MCH: 28 pg (ref 26.0–34.0)
MCHC: 32.3 g/dL (ref 30.0–36.0)
MCV: 86.7 fL (ref 80.0–100.0)
Monocytes Absolute: 0.8 10*3/uL (ref 0.1–1.0)
Monocytes Relative: 12 %
Neutro Abs: 3.3 10*3/uL (ref 1.7–7.7)
Neutrophils Relative %: 48 %
Platelets: 116 10*3/uL — ABNORMAL LOW (ref 150–400)
RBC: 2.86 MIL/uL — ABNORMAL LOW (ref 3.87–5.11)
RDW: 18.1 % — ABNORMAL HIGH (ref 11.5–15.5)
WBC: 6.7 10*3/uL (ref 4.0–10.5)
nRBC: 10.3 % — ABNORMAL HIGH (ref 0.0–0.2)

## 2021-01-06 LAB — COMPREHENSIVE METABOLIC PANEL
ALT: 122 U/L — ABNORMAL HIGH (ref 0–44)
AST: 156 U/L — ABNORMAL HIGH (ref 15–41)
Albumin: 3.2 g/dL — ABNORMAL LOW (ref 3.5–5.0)
Alkaline Phosphatase: 270 U/L — ABNORMAL HIGH (ref 38–126)
Anion gap: 9 (ref 5–15)
BUN: 10 mg/dL (ref 6–20)
CO2: 21 mmol/L — ABNORMAL LOW (ref 22–32)
Calcium: 8.3 mg/dL — ABNORMAL LOW (ref 8.9–10.3)
Chloride: 108 mmol/L (ref 98–111)
Creatinine, Ser: 0.79 mg/dL (ref 0.44–1.00)
GFR, Estimated: >60 mL/min (ref >60)
Glucose, Bld: 125 mg/dL — ABNORMAL HIGH (ref 70–99)
Potassium: 3.8 mmol/L (ref 3.5–5.1)
Sodium: 138 mmol/L (ref 135–145)
Total Bilirubin: 1.5 mg/dL — ABNORMAL HIGH (ref 0.3–1.2)
Total Protein: 7.3 g/dL (ref 6.5–8.1)

## 2021-01-06 LAB — IRON AND TIBC
Iron: 196 ug/dL — ABNORMAL HIGH (ref 41–142)
Saturation Ratios: 102 % — ABNORMAL HIGH (ref 21–57)
TIBC: 193 ug/dL — ABNORMAL LOW (ref 236–444)
UIBC: UNDETERMINED ug/dL (ref 120–384)

## 2021-01-06 LAB — RETICULOCYTES
Immature Retic Fract: 31.8 % — ABNORMAL HIGH (ref 2.3–15.9)
RBC.: 2.83 MIL/uL — ABNORMAL LOW (ref 3.87–5.11)
Retic Count, Absolute: 42.5 10*3/uL (ref 19.0–186.0)
Retic Ct Pct: 1.5 % (ref 0.4–3.1)

## 2021-01-06 LAB — VITAMIN B12: Vitamin B-12: 3636 pg/mL — ABNORMAL HIGH (ref 180–914)

## 2021-01-06 LAB — FOLATE: Folate: 14 ng/mL (ref >5.9)

## 2021-01-06 LAB — SAVE SMEAR(SSMR), FOR PROVIDER SLIDE REVIEW

## 2021-01-06 LAB — LACTATE DEHYDROGENASE: LDH: 810 U/L — ABNORMAL HIGH (ref 98–192)

## 2021-01-06 LAB — FERRITIN: Ferritin: 4007 ng/mL — ABNORMAL HIGH (ref 11–307)

## 2021-01-07 NOTE — Progress Notes (Addendum)
[place]  [phone] [phone]     ID: [name] [name] DOB: [birth year]  MR#: [id number]  KDT#:[id number]  Patient Care Team: Townsend Roger, MD as PCP - General (Internal Medicine) Derwood Kaplan, MD as PCP - Hematology/Oncology (Oncology) Magrinat, Virgie Dad, MD as Consulting Physician (Oncology) Jacelyn Pi, MD as Referring Physician (Endocrinology) Armandina Gemma, MD as Consulting Physician (General Surgery) Servando Salina, MD as Consulting Physician (Obstetrics and Gynecology) Scot Dock, NP OTHER MD:  CHIEF COMPLAINT: metastatic breast cancer, estrogen receptor positive  CURRENT TREATMENT: letrozole, palbociclib, goserelin, denosumab   INTERVAL HISTORY: [name] returns today for follow up of her metastatic breast cancer.    She has had worsening anemia. She has continued to be nauseated and was miserable and called [place] earlier this week.  She is taking dexamethasone BID 3-4 days ago which has helped.  She also saw her endocrinologist who changed her Synthroid dosage due to her thyroid levels being off, and she is feeling better slightly from a fatigue standpoint.   She is having normal bowel movements. She is not having any new pain, short of breath, cough, or any other concerns.    a has continued on letrozole daily, and receives the xgeva and zoladex with good tolerance.  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 She has had a persistent anemia.  She stopped taking the Ibrance/Palbociclib on 12/25/2020.  She was seen in 12/18/2020 at the [facility] and underwent CT scan that showed a possible liver lesion.  MRI of the liver is scheduled for 01/12/2021.     Her hemoglobin has been as follows.  She received a blood transfusion on 11/22/2020 and 12/27/2020.  She is taking Aranesp, 500 mcg every 4 weeks and does not feel like it is helping her. She received this on 11/27/2020 and 12/26/2020.  Results for Urbanski,  [name] K (MRN [id number]) as of 01/08/2021 10:07  Ref. Range 08/28/2020 09:01 09/25/2020 08:01 10/29/2020 13:24 11/11/2020 10:34 11/20/2020 10:41 11/27/2020 11:40 12/26/2020 14:01 01/06/2021 09:34  Hemoglobin Latest Ref Range: 12.0 - 15.0 g/dL 9.6 (L) 9.7 (L) 8.3 (L) 7.8 (L) 7.0 (L) 9.8 (L) 6.8 (LL) 8.0 (L)    REVIEW OF SYSTEMS: Review of Systems  Constitutional:  Positive for fatigue. Negative for appetite change, chills, fever and unexpected weight change.  HENT:   Negative for hearing loss, lump/mass, mouth sores and trouble swallowing.   Eyes:  Negative for eye problems and icterus.  Respiratory:  Negative for chest tightness, cough and shortness of breath.   Cardiovascular:  Negative for chest pain, leg swelling and palpitations.  Gastrointestinal:  Positive for nausea. Negative for abdominal distention, abdominal pain, constipation, diarrhea and vomiting.  Endocrine: Negative for hot flashes.  Genitourinary:  Negative for difficulty urinating.   Musculoskeletal:  Negative for arthralgias.  Skin:  Negative for itching and rash.  Neurological:  Negative for dizziness, extremity weakness, headaches and numbness.  Hematological:  Negative for adenopathy. Does not bruise/bleed easily.  Psychiatric/Behavioral:  Negative for depression. The patient is not nervous/anxious.     COVID 19 VACCINATION STATUS: Status post Moderna x3, third dose October 2021; booster pending   HISTORY OF CURRENT ILLNESS: From the [place] note dated 04/10/2020 by Dickey Gave, PA-C:   "[name] K Shinn is a 40 y.o. female African American female with stage IV (T3 N1 M1) hormone receptor positive right breast cancer diagnosed in December 2020. We began seeing in November  2020 for leukocytosis, anemia and thrombocytopenia.  She had bruising and nose bleeds, as well as a 10-15 lb weight loss.  She had been having chronic back pain for several months, for which she was using ibuprofen or Aleve.  LDH was  markedly elevated at 2599, reticulocyte count mildly elevated at 3.4% and haptoglobin was less than 10, which was consistent with hemolysis, but Coombs was negative.  CT chest, abdomen and pelvis revealed diffusely sclerotic osseous structures with multiple lytic lesions noted throughout the axial skeleton.  Some lesions demonstrated expansile soft tissue components, and these findings are highly concerning for multiple myeloma or other osseous metastatic disease of uncertain origin.  A spiculated appearing mass or tissue element was also seen in the lateral right breast, 9 o'clock.  Bone marrow biopsy revealed abundant metastatic carcinoma, which was estrogen receptor positive, so consistent with breast cancer origin.  With that information, it became clear that she had malignant breast cancer that had metastasized to the bone.  She underwent a digital diagnostic bilateral mammogram with right breast ultrasound in December, which revealed highly suspicious calcifications spanning at least 9.2 cm in the upper outer quadrant of the right breast with distortion associated with the posterior calcifications.  A discrete mass was seen in the right breast with ultrasound at 10 o'clock, 8 cm from the nipple measuring 9 x 6 x 7 mm.  Several mildly abnormal nodes are seen in the right axilla with cortices measuring between 3 and 5 mm.  The lymph nodes were symmetric on mammography and were not definitely different compared to 2015.  Stereotactic needle core biopsy of the right breast and right axilla confirmed grade 2, invasive ductal carcinoma, as well as ductal carcinoma in situ.  The invasive component measured 0.9 cm in greatest linear extent on the core biopsy.  Metastatic carcinoma was involved in one lymph node.  Estrogen receptor positive at 90%, progesterone receptor positive at 1%, and HER2 negative.  Ki67 was 15%.  Nuclear medicine PET scan revealed widespread hypermetabolic bony metastatic disease.  Low level  hypermetabolism in bilateral axillary nodes, right greater than left was seen with uptake identified in the ill defined soft tissue lesion in the lateral right breast.  Foci of hypermetabolism identified in the central uterus along the IUD and along the anterior cervix and adjacent small bowel, which were nonspecific.  MRI head did not reveal intracranial metastasis, but diffusely abnormal bone marrow in the skull and cervical spine likely due to metastatic disease was observed.  CA 27-29 was not elevated.   Due to her age of diagnosis, she underwent testing for hereditary breast cancer with the [employer]  myRisk hereditary cancer panel test.  This did not reveal any clinically significant mutation.  There were variants of uncertain significance of the ATM, AXIN2 and MSH3 genes.   She had severer pain, so was placed on methadone, in addition to oxycodone and the doses were titrated up.  We recommended letrozole/palbociclib, denosumab monthly for the bone metastasis and goserelin monthly to suppress ovarian function due to being premenopausal.  She started letrozole on December 28th and denosumab and goserelin on January 7th.  She started palbociclib for 3 weeks on and one week off on January 9th.   She develops severe hypocalcemia, which required IV calcium replacement. Denosumab had to be held due to the severe hypocalcemia, but was resumed in March.  She was placed on high doses of oral calcium.  She had multiple other issues, such as decreased appetite,  nausea and vomiting, as well  As dehydration, managed with medications. MRI thoracic and lumbar spine on January 19th revealed diffusely decreased T1 bone marrow signal with fairly homogeneous enhancement throughout the thoracolumbar spine.  Prior PET-CT demonstrated homogeneous hypermetabolic activity throughout the thoracolumbar spine.  These findings are favored to be related to the patient's anemia rather than diffuse metastatic disease.  There were possible  small osseous metastases, notably within the T3 spinous process, left L2 pedicle and upper right sacrum.  There was no significant disc herniation, spinal stenosis or nerve root encroachment.  She was subsequently weaned off methadone and has not had recurrent pain.  She developed cytopenias due to palbociclib and we occasionally had to delay cycles   CT chest, abdomen and pelvis in May revealed diffuse patchy confluent sclerotic osseous metastatic disease throughout the axial and proximal appendicular skeleton, probably representing treatment effect.  ThereWere healed deformities throughout the bilateral ribs.  A new mild right axillary lymphadenopathy was also observed, which wasnonspecific.  No additional sites of metastatic disease were seen in the chest, abdomen or pelvis.  She had a  tubal ligation and removal of her Mirena IUD in June.  CT chest, abdomen and pelvis in September revealed again widespread osseous sclerosis, indicative of treated metastatic disease.  The previous borderline enlarged right axillary lymph nodes had resolved.  She had recurrent cytopenias, and since we had to delay multiple cycles of palbociclib, we decreased her palbociclib dose to 100 mg daily for 3 weeks on a week off."  The patient's subsequent history is as detailed below.   PAST MEDICAL HISTORY: Past Medical History:  Diagnosis Date   Anemia    History of cardiac murmur as a child    History of Graves' disease    dx hyperthyroidism during 4th pregnancy;   11-15-2013  s/p  total thyroidectomy   Hypothyroidism, postsurgical 11/15/2013   endocrinologist--- dr balan   Pre-diabetes    Stage IV breast cancer in female [facility]) 05/2019   oncologist-- dr c. [place] Rao ([facility]) bone marrow bx 05-08-2019 and right breast bx 05-17-2020--- primary breast w/ mets to bones, pt taking oral chemo (ibrance)  Tumor associated hemolysis at presentation December 2020   PAST SURGICAL HISTORY: Past Surgical  History:  Procedure Laterality Date   IUD REMOVAL N/A 11/22/2019   Procedure: INTRAUTERINE DEVICE (IUD) REMOVAL;  Surgeon: Servando Salina, MD;  Location: [facility];  Service: Gynecology;  Laterality: N/A;   LAPAROSCOPIC TUBAL LIGATION Bilateral 11/22/2019   Procedure: LAPAROSCOPIC TUBAL LIGATION By Cautery;  Surgeon: Servando Salina, MD;  Location: [facility];  Service: Gynecology;  Laterality: Bilateral;   THYROIDECTOMY Bilateral 11/15/2013   Procedure: TOTAL THYROIDECTOMY;  Surgeon: Earnstine Regal, MD;  Location: [facility];  Service: General;  Laterality: Bilateral;   WISDOM TOOTH EXTRACTION  2012    FAMILY HISTORY: Family History  Problem Relation Age of Onset   Cancer Mother        Peripheral T cell Lymphoma   Diabetes Father    Diabetes Maternal Grandmother   The patient's mother died at age 78 shortly after her diagnosis of T-cell non-Hodgkin's lymphoma. The patient's father is 71 years old as of December 2020. He has a history of diabetes, is a bilateral amputee, and has a history of EtOH abuse. The patient has 2 half siblings on her mother side and 3/2 siblings on her father's side. None of them have a history of cancer. She has a maternal cousin with  a history of B-cell non-Hodgkin's lymphoma and a maternal grandfather with prostate cancer   GYNECOLOGIC HISTORY:  Patient's last menstrual period was 10/26/2018. Menarche: 40 years old Age at first live birth: 40 years old [place] P 4 LMP December 2020 (when goserelin started Contraceptive: Status post bilateral tubal ligation HRT n/a  Hysterectomy? no BSO? no   SOCIAL HISTORY: (updated 04/2020)  [name] is divorced. Her former husband Janaria Mccammon is a Armed forces technical officer for the [employer]. 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  She works on Friday Saturdays and Sundays only.  Her daughters were called [name], Donald Pore, and [name], age 26-9 as of December 2021. They stay 1 week with the  patient in 1 week with her ex-husband. The patient also has a burn doodle called Karlene Lineman. The patient is a Psychologist, forensic    ADVANCED DIRECTIVES: Not in place. At the 05/06/2020 visit the patient was given the appropriate documents to complete and notarized at her discretion.   HEALTH MAINTENANCE: Social History   Tobacco Use   Smoking status: Never   Smokeless tobacco: Never  Vaping Use   Vaping Use: Never used  Substance Use Topics   Alcohol use: Not Currently    Comment: social   Drug use: Never     Colonoscopy: n/a (age)  PAP: Up-to-date  Bone density: Remote   No Known Allergies  Current Outpatient Medications  Medication Sig Dispense Refill   b complex vitamins capsule Take 1 capsule by mouth daily.     Calcium Carb-Cholecalciferol (CALCIUM 600 + D PO) Take 4 tablets by mouth daily.     Cholecalciferol (VITAMIN D3) 25 MCG (1000 UT) CAPS Take 3 capsules by mouth daily.     Denosumab (XGEVA [place]) Inject into the skin every 30 (thirty) days.     dexamethasone (DECADRON) 4 MG tablet Take 1 tablet (4 mg total) by mouth 2 (two) times daily with a meal. 20 tablet 0   Goserelin Acetate (ZOLADEX [place]) Inject into the skin every 30 (thirty) days.     letrozole (FEMARA) 2.5 MG tablet Take 1 tablet (2.5 mg total) by mouth daily. 90 tablet 4   levothyroxine (SYNTHROID) 112 MCG tablet 1 tablet in the morning on an empty stomach     metFORMIN (GLUCOPHAGE) 500 MG tablet Take 1 tablet (500 mg total) by mouth 2 (two) times daily with a meal.     ondansetron (ZOFRAN) 8 MG tablet TAKE 1 TABLET (8 MG TOTAL) BY MOUTH 3 (THREE) TIMES DAILY AS NEEDED FOR NAUSEA OR VOMITING. 18 tablet 1   ibuprofen (ADVIL) 800 MG tablet Take 1 tablet (800 mg total) by mouth every 8 (eight) hours as needed for moderate pain. (Patient not taking: Reported on 01/08/2021) 30 tablet 5   palbociclib (IBRANCE) 75 MG capsule Take 1 capsule (75 mg total) by mouth daily with breakfast. Take whole with food. Take for 21 days on, 7 days  off, repeat every 28 days. (Patient not taking: Reported on 01/08/2021) 21 capsule 6   valACYclovir (VALTREX) 500 MG tablet Take 1 tablet (500 mg total) by mouth daily. (Patient not taking: Reported on 01/08/2021) 90 tablet 4   No current facility-administered medications for this visit.    OBJECTIVE: African-American woman who appears well  Vitals:   01/08/21 0946  BP: 113/74  Pulse: 69  Resp: 18  Temp: 97.7 F (36.5 C)  SpO2: 100%     Body mass index is 30.9 kg/m.   Wt Readings from Last 3 Encounters:  01/08/21 185 lb 11.2 oz (84.2 kg)  12/26/20 189 lb 9.6 oz (86 kg)  11/27/20 196 lb 12.8 oz (89.3 kg)      ECOG FS:1 - Symptomatic but completely ambulatory GENERAL: Patient is a well appearing female in no acute distress HEENT:  Sclerae anicteric.  Oropharynx clear and moist. No ulcerations or evidence of oropharyngeal candidiasis. Neck is supple.  NODES:  No cervical, supraclavicular, or axillary lymphadenopathy palpated.  BREAST EXAM:  Deferred. LUNGS:  Clear to auscultation bilaterally.  No wheezes or rhonchi. HEART:  Regular rate and rhythm. No murmur appreciated. ABDOMEN:  Soft, nontender.  Positive, normoactive bowel sounds. No organomegaly palpated. MSK:  No focal spinal tenderness to palpation. Full range of motion bilaterally in the upper extremities. EXTREMITIES:  No peripheral edema.   SKIN:  Clear with no obvious rashes or skin changes. No nail dyscrasia. NEURO:  Nonfocal. Well oriented.  Appropriate affect.    LAB RESULTS:  CMP     Component Value Date/Time   NA 138 01/06/2021 0934   NA 140 04/16/2020 0000   K 3.8 01/06/2021 0934   CL 108 01/06/2021 0934   CO2 21 (L) 01/06/2021 0934   GLUCOSE 125 (H) 01/06/2021 0934   BUN 10 01/06/2021 0934   BUN 18 04/16/2020 0000   CREATININE 0.79 01/06/2021 0934   CREATININE 0.93 11/11/2020 1034   CALCIUM 8.3 (L) 01/06/2021 0934   PROT 7.3 01/06/2021 0934   ALBUMIN 3.2 (L) 01/06/2021 0934   AST 156 (H) 01/06/2021  0934   AST 38 11/11/2020 1034   ALT 122 (H) 01/06/2021 0934   ALT 33 11/11/2020 1034   ALKPHOS 270 (H) 01/06/2021 0934   BILITOT 1.5 (H) 01/06/2021 0934   BILITOT 0.3 11/11/2020 1034   GFRNONAA >60 01/06/2021 0934   GFRNONAA >60 11/11/2020 1034   GFRAA >60 04/28/2019 1218    No results found for: TOTALPROTELP, ALBUMINELP, A1GS, A2GS, BETS, BETA2SER, GAMS, MSPIKE, SPEI  Lab Results  Component Value Date   WBC 6.7 01/06/2021   NEUTROABS 3.3 01/06/2021   HGB 8.0 (L) 01/06/2021   HCT 24.8 (L) 01/06/2021   MCV 86.7 01/06/2021   PLT 116 (L) 01/06/2021    No results found for: LABCA2  No components found for: [id number]  No results for input(s): INR in the last 168 hours.  No results found for: LABCA2  No results found for: IDU373  No results found for: HDI978  No results found for: ERQ412  Lab Results  Component Value Date   CA2729 16.2 11/27/2020    No components found for: HGQUANT  No results found for: CEA1 / No results found for: CEA1   No results found for: AFPTUMOR  No results found for: CHROMOGRNA  No results found for: KPAFRELGTCHN, LAMBDASER, KAPLAMBRATIO (kappa/lambda light chains)  No results found for: HGBA, HGBA2QUANT, HGBFQUANT, HGBSQUAN (Hemoglobinopathy evaluation)   Lab Results  Component Value Date   LDH 810 (H) 01/06/2021    Lab Results  Component Value Date   IRON 196 (H) 01/06/2021   TIBC 193 (L) 01/06/2021   IRONPCTSAT 102 (H) 01/06/2021   (Iron and TIBC)  Lab Results  Component Value Date   FERRITIN 4,007 (H) 01/06/2021    Urinalysis    Component Value Date/Time   COLORURINE YELLOW 04/28/2019 1223   APPEARANCEUR HAZY (A) 04/28/2019 1223   LABSPEC 1.024 04/28/2019 1223   PHURINE 5.0 04/28/2019 1223   GLUCOSEU NEGATIVE 04/28/2019 [place] 04/28/2019 1223   [place]  04/28/2019 [place] 04/28/2019 1223   PROTEINUR NEGATIVE 04/28/2019 1223   NITRITE NEGATIVE 04/28/2019 1223    LEUKOCYTESUR NEGATIVE 04/28/2019 1223    STUDIES: No results found.   ELIGIBLE FOR AVAILABLE RESEARCH PROTOCOL: no  ASSESSMENT: 40 y.o. [place] woman presenting NOV 2020 with stage IV breast cancer as follows:  (a) workup of initial pancytopenia and hemolysis showed a leukoerythroblastic blood picture with bone marrow biopsy 05/07/2020 confirming metastatic carcinoma, estrogen receptor positive; cytogenetics were normal  (b) CT scans at baseline showing multiple bone lesions (both sclerotic and lytic) and a possible mass in the right breast; no definitive lung or liver involvement  (c) right breast upper outer quadrant biopsy x2 and right axillary lymph node biopsy on 05/18/2019 confirmed a clinical 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, grade 1-2, estrogen receptor strongly positive, progesterone receptor moderately positive (1%), HER-2 negative, with an MIB-1 of 2-15%  (d) CA 27-29 was not informative (repeat 05/06/2020 WNL)  (1) letrozole started 05/28/2019  (a) goserelin started 06/07/2019, repeated every 4 weeks  (b) palbociclib added 06/09/2019  (c) palbociclib dose decreased to 100 mg daily, 21/7, with September 2021 cycle  (2) denosumab/Xgeva started 06/07/2019, repeated every 4 weeks  (3) genetics testing through the [facility] MyRisk panel dated 09/22/2019 found no deleterious mutations in the genes tested which included BRCA 1 and 2, CHEK2, PALB 2 and TP53 among others.  (a) variants of uncertain significance were found in ATM, AXN 2, and [place] 3.  (4) restaging studies:  (a) chest CT and bone scan 05/21/2020 shows no visceral disease, stable bone lesions  (b) bilateral mammography and right breast ultrasonography 06/25/2020 shows the measurable disease in the right breast to have decreased.  There is some progressive calcifications that require monitoring  (c) bone scan 11/21/2020 shows no new bone lesions  (d) breast ultrasound 11/12/2020 shows the previously noted 10:00  mass to have resolved.  The right axillary adenopathy also has resolved.  There is some increase in pleomorphic calcifications as previously noted  (5) chemotherapy associated anemia, requiring transfusion:  (a) EPO started 11/27/2020   PLAN: [name] is here today for follow up of her metastatic breast cancer.  She will continue on treatment with Letrozole daily, Goserelin every 4 weeks, and Xgeva.  The Palbociclib is currently being held, and will continue to be held until we get more information about her cancer status.  [name] met with myself and Dr. Jana Hakim.  He reviewed her anemia and that it could be due to the Palbociclib, or it could be due to the cancer.  She was recommended to receive Aranesp/darbapoeitin every 2 weeks instead of every 4 weeks.  He explained that the aranesp works as a Pension scheme manager to her marrow to improve her anemia.    [name] also has the issue of nausea.  She will continue with taking dexamethasone.  WE are also adding an MRI of the brain to rule out progression of cancer due to the nausea and vomiting.  [name] will not receive a blood transfuison tomorrow as we had initially anticipated.  Her hemoglobin on Monday was 8 and she is not symptomatic.    [name] will return on 8/17 for labs, f/u with Dr. Jana Hakim, and a possible blood transfusion.  She knows to call for any questions that may arise between now and her next appointment.  We are happy to see her sooner if needed.   Wilber Bihari, NP 01/08/21 10:06 AM Medical Oncology and Hematology [facility]  [place] [place], [place] [address] Tel. [phone]    Fax. [phone]   ADDENDUM: Continues situation is complex.  She has bone marrow involvement by tumor which limits her bone marrow function.  When we try to add a CDK inhibitor this of course also affects the marrow and resulted in cytopenias which again limit what we can do in terms of treatment.  We discussed this today.  She  understands why we added the erythropoietin and in fact we would like to increase that to every 2 weeks to see if we can make her transfusion independent.  Recall that she carries the hemochromatosis gene, has an elevated ferritin, and every time we transfuse her to the iron is going to go up again.  She will eventually need an iron chelator if we do not watch out regarding this.  Are going to obtain a repeat MRI of the liver to see to what extent what we are seeing is due to medication and to cancer.  She will then return to see [place] to review those results.  I personally saw this patient and performed a substantive portion of this encounter with the listed APP documented above.   Chauncey Cruel, MD Medical Oncology and Hematology [facility] [address] [place],  [address] Tel. [phone]    Fax. [phone]    *Total Encounter Time as defined by the Centers for Medicare and Medicaid Services includes, in addition to the face-to-face time of a patient visit (documented in the note above) non-face-to-face time: obtaining and reviewing outside history, ordering and reviewing medications, tests or procedures, care coordination (communications with other health care professionals or caregivers) and documentation in the medical record.

## 2021-01-08 ENCOUNTER — Other Ambulatory Visit: Payer: Self-pay | Admitting: Oncology

## 2021-01-08 ENCOUNTER — Other Ambulatory Visit: Payer: Self-pay

## 2021-01-08 ENCOUNTER — Encounter: Payer: Self-pay | Admitting: Adult Health

## 2021-01-08 ENCOUNTER — Inpatient Hospital Stay (HOSPITAL_BASED_OUTPATIENT_CLINIC_OR_DEPARTMENT_OTHER): Payer: Commercial Managed Care - PPO | Admitting: Adult Health

## 2021-01-08 VITALS — BP 113/74 | HR 69 | Temp 97.7°F | Resp 18 | Ht 65.0 in | Wt 185.7 lb

## 2021-01-08 DIAGNOSIS — C50411 Malignant neoplasm of upper-outer quadrant of right female breast: Secondary | ICD-10-CM

## 2021-01-08 DIAGNOSIS — C50811 Malignant neoplasm of overlapping sites of right female breast: Secondary | ICD-10-CM

## 2021-01-08 DIAGNOSIS — Z5111 Encounter for antineoplastic chemotherapy: Secondary | ICD-10-CM | POA: Diagnosis not present

## 2021-01-08 DIAGNOSIS — C7951 Secondary malignant neoplasm of bone: Secondary | ICD-10-CM | POA: Diagnosis not present

## 2021-01-08 DIAGNOSIS — Z170 Estrogen receptor positive status [ER+]: Secondary | ICD-10-CM | POA: Diagnosis not present

## 2021-01-09 ENCOUNTER — Inpatient Hospital Stay: Payer: Commercial Managed Care - PPO

## 2021-01-12 ENCOUNTER — Other Ambulatory Visit: Payer: Self-pay

## 2021-01-12 ENCOUNTER — Ambulatory Visit (HOSPITAL_COMMUNITY)
Admission: RE | Admit: 2021-01-12 | Discharge: 2021-01-12 | Disposition: A | Discharge status: Home / Self Care | Payer: Commercial Managed Care - PPO | Source: Ambulatory Visit | Attending: Oncology | Admitting: Oncology

## 2021-01-12 DIAGNOSIS — C7951 Secondary malignant neoplasm of bone: Secondary | ICD-10-CM | POA: Insufficient documentation

## 2021-01-12 DIAGNOSIS — D582 Other hemoglobinopathies: Secondary | ICD-10-CM

## 2021-01-12 DIAGNOSIS — C50411 Malignant neoplasm of upper-outer quadrant of right female breast: Secondary | ICD-10-CM | POA: Diagnosis present

## 2021-01-12 DIAGNOSIS — Z170 Estrogen receptor positive status [ER+]: Secondary | ICD-10-CM | POA: Diagnosis present

## 2021-01-12 IMAGING — MR MR ABDOMEN WO/W CM
30 series · 48 of 48 positions shown · IV contrast (gadavist)
Comparison: [DATE] CT chest, abdomen and pelvis.

CLINICAL DATA: Metastatic breast cancer. Evaluate possible liver
metastasis.

EXAM:
MRI ABDOMEN WITHOUT AND WITH CONTRAST
TECHNIQUE: Multiplanar multisequence MR imaging of the abdomen was performed
both before and after the administration of intravenous contrast.
CONTRAST:  8mL GADAVIST GADOBUTROL 1 MMOL/ML IV SOLN

[Series 3: cor haste · coronal · 6.0mm · 1.48mm/px · 1 of 27 slices shown]
[im 1/27]
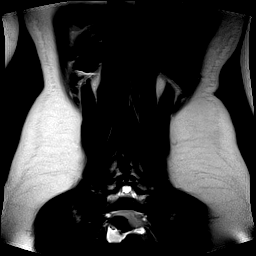

[Series 4: bSSFP · axial · 6.0mm · 0.99mm/px · 1 of 43 slices shown]
[im 1/43]
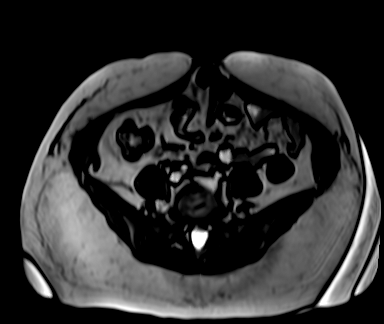

[Series 6: T2 fat-sat · axial · 6.0mm · 1.19mm/px · 1 of 37 slices shown]
[im 1/37]
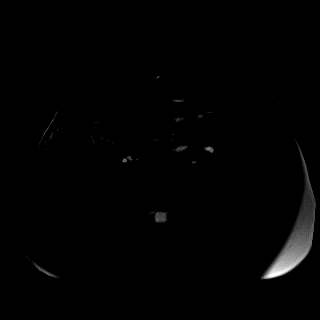

[Series 8: DWI · axial · 6.0mm · 1.42mm/px · 1 of 70 slices shown (1 of 2)]
[im 1/70]
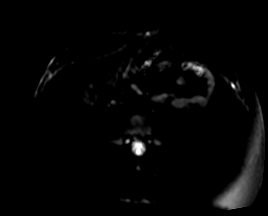

[Series 9: DWI · axial · 6.0mm · 1.42mm/px · 1 of 35 slices shown (2 of 2)]
[im 1/35]
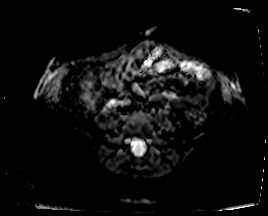

[Series 10: T1 · axial · 3.0mm · 1.25mm/px · 1 of 88 slices shown (1 of 2)]
[im 1/88]
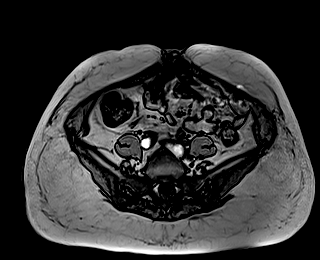

[Series 11: T1 · axial · 3.0mm · 1.25mm/px · 1 of 88 slices shown (2 of 2)]
[im 1/88]
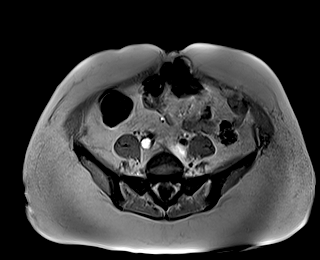

[Series 12: T1 dynamic · axial · 3.0mm · 1.19mm/px · 1 of 96 slices shown (1 of 17)]
[im 1/96]
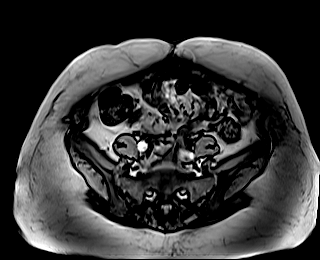

[Series 13: T1 dynamic · axial · 3.0mm · 1.19mm/px · 1 of 96 slices shown (2 of 17)]
[im 1/96]
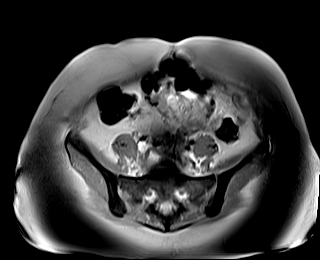

[Series 14: T1 dynamic · axial · 3.0mm · 1.19mm/px · z∈[-25,+258]mm · 2 of 96 slices shown (3 of 17)]
[im 1/96]
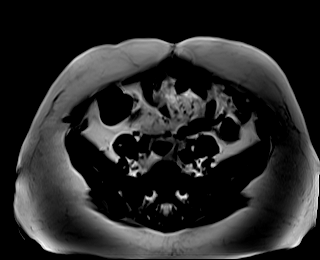
[im 96/96]
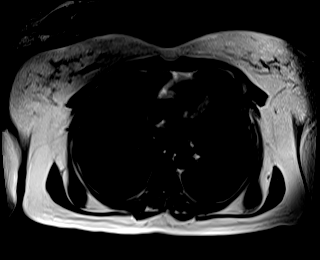

[Series 15: T1 dynamic · axial · 3.0mm · 1.19mm/px · z∈[-25,+258]mm · 2 of 96 slices shown (4 of 17)]
[im 1/96]
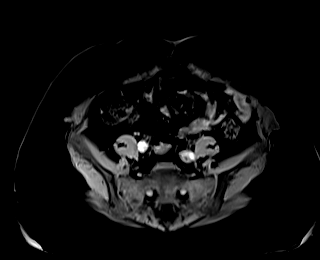
[im 96/96]
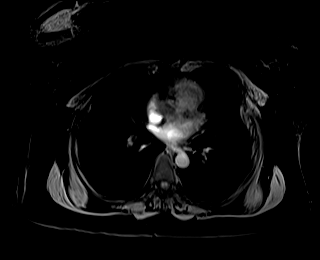

[Series 18: T1 dynamic · axial · 3.0mm · 1.19mm/px · z∈[-25,+258]mm · 2 of 96 slices shown (5 of 17)]
[im 1/96]
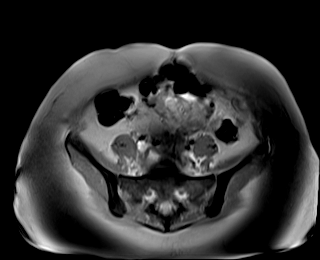
[im 96/96]
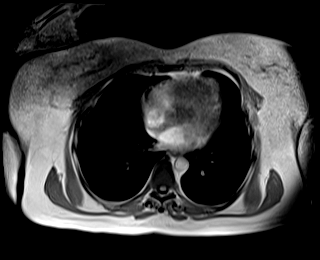

[Series 19: T1 dynamic · axial · 3.0mm · 1.19mm/px · z∈[-25,+258]mm · 2 of 96 slices shown (6 of 17)]
[im 1/96]
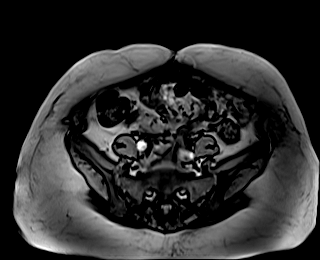
[im 96/96]
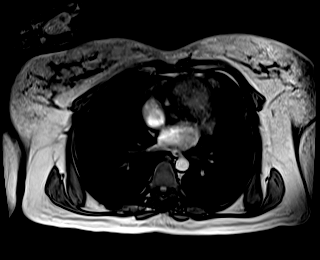

[Series 20: T1 dynamic · axial · 3.0mm · 1.19mm/px · z∈[-25,+258]mm · 2 of 96 slices shown (7 of 17)]
[im 1/96]
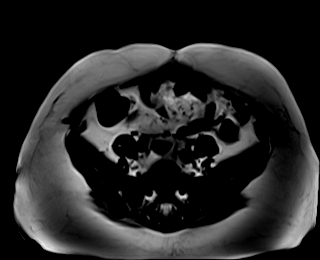
[im 96/96]
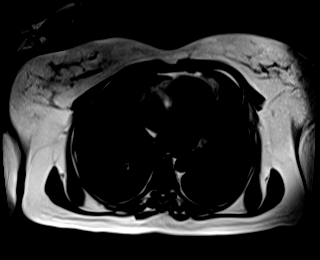

[Series 21: T1 dynamic · axial · 3.0mm · 1.19mm/px · z∈[-25,+258]mm · 2 of 96 slices shown (8 of 17)]
[im 1/96]
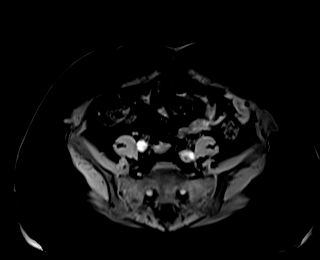
[im 96/96]
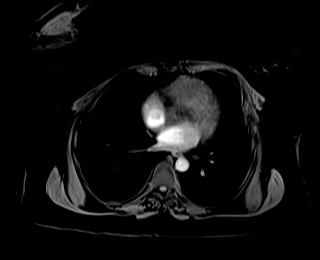

[Series 22: T1 dynamic · axial · 3.0mm · 1.19mm/px · z∈[-25,+258]mm · 2 of 96 slices shown (9 of 17)]
[im 1/96]
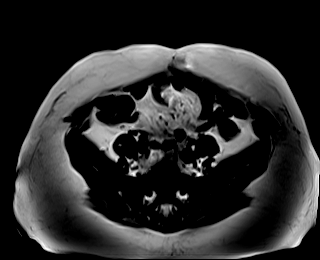
[im 96/96]
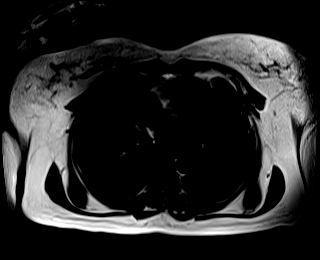

[Series 23: T1 dynamic · axial · 3.0mm · 1.19mm/px · z∈[-25,+258]mm · 2 of 96 slices shown (10 of 17)]
[im 1/96]
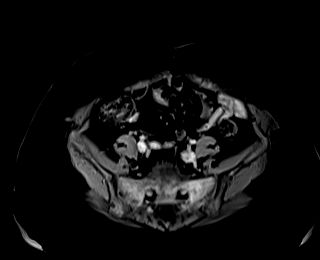
[im 96/96]
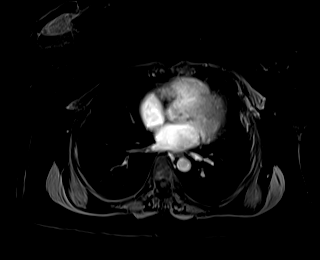

[Series 24: T1 dynamic · axial · 3.0mm · 1.19mm/px · z∈[-25,+258]mm · 2 of 96 slices shown (11 of 17)]
[im 1/96]
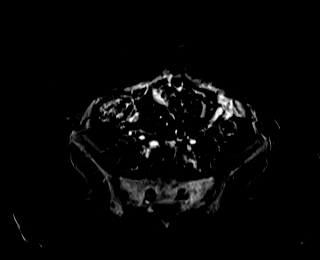
[im 96/96]
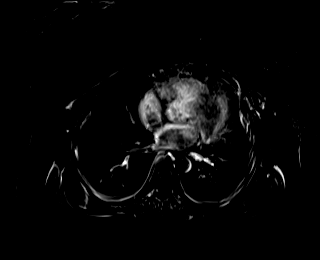

[Series 25: T1 dynamic · axial · 3.0mm · 1.19mm/px · z∈[-25,+258]mm · 2 of 96 slices shown (12 of 17)]
[im 1/96]
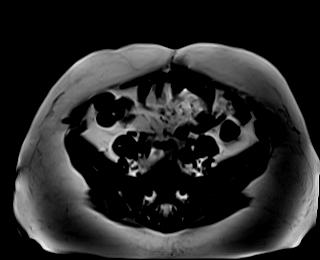
[im 96/96]
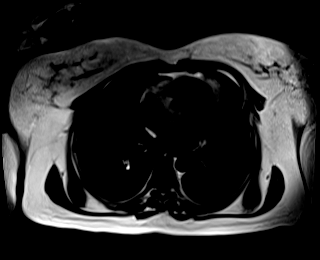

[Series 26: T1 dynamic · axial · 3.0mm · 1.19mm/px · z∈[-25,+258]mm · 2 of 96 slices shown (13 of 17)]
[im 1/96]
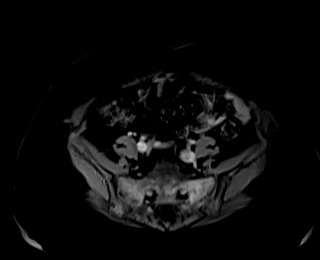
[im 96/96]
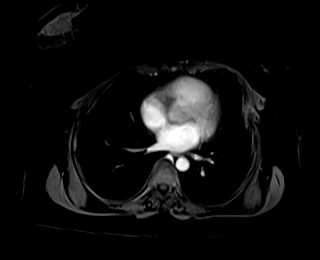

[Series 27: T1 dynamic · axial · 3.0mm · 1.19mm/px · z∈[-25,+258]mm · 2 of 96 slices shown (14 of 17)]
[im 1/96]
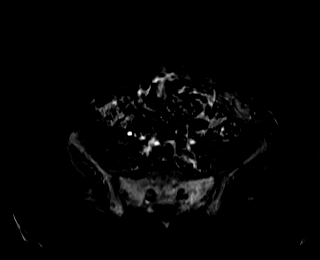
[im 96/96]
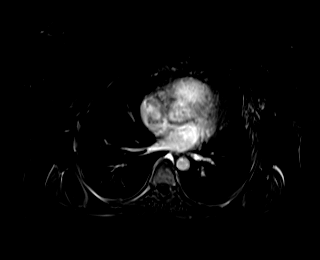

[Series 28: T1 dynamic · axial · 3.0mm · 1.19mm/px · z∈[-25,+258]mm · 2 of 96 slices shown (15 of 17)]
[im 1/96]
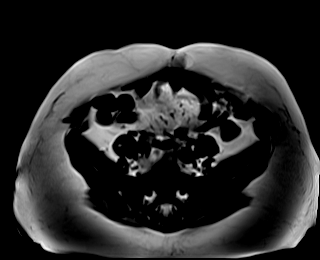
[im 96/96]
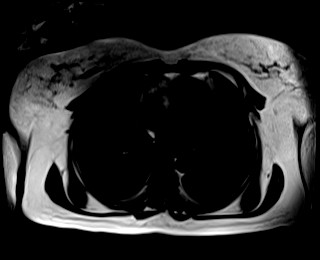

[Series 29: T1 dynamic · axial · 3.0mm · 1.19mm/px · z∈[-25,+258]mm · 2 of 96 slices shown (16 of 17)]
[im 1/96]
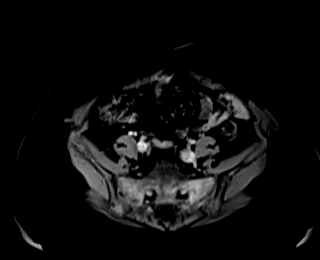
[im 96/96]
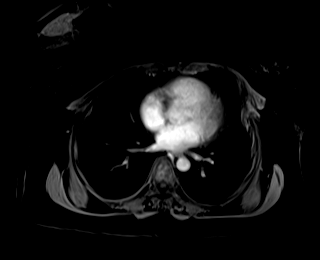

[Series 30: T1 dynamic · axial · 3.0mm · 1.19mm/px · z∈[-25,+258]mm · 2 of 96 slices shown (17 of 17)]
[im 1/96]
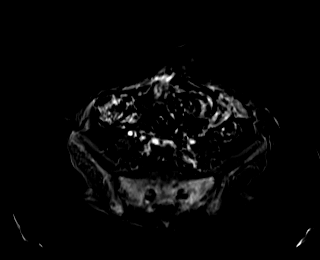
[im 96/96]
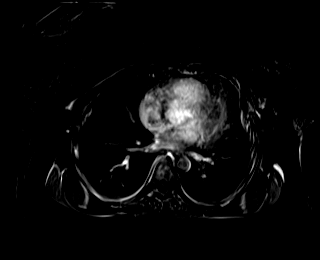

[Series 31: ax_haste_mbh · axial · 6.0mm · 1.19mm/px · 1 of 40 slices shown]
[im 1/40]
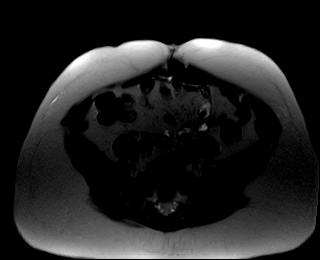

[Series 32: cor_vibe_dixon_delayed_f · coronal · 3.0mm · 1.76mm/px · 1 of 72 slices shown]
[im 1/72]
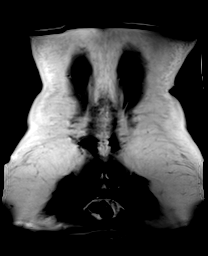

[Series 33: cor_vibe_dixon_delayed_w · coronal · 3.0mm · 1.76mm/px · 1 of 72 slices shown]
[im 1/72]
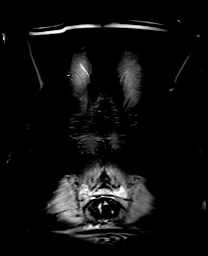

[Series 34: ax_dixon_delayed_f_reg · axial · 3.0mm · 1.19mm/px · z∈[-25,+258]mm · 2 of 96 slices shown]
[im 1/96]
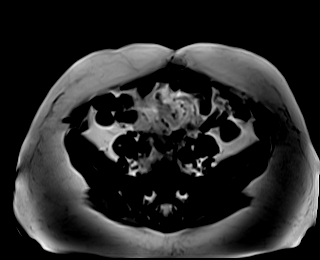
[im 96/96]
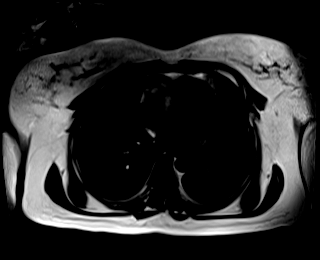

[Series 35: ax_dixon_delayed_w_reg · axial · 3.0mm · 1.19mm/px · z∈[-25,+258]mm · 2 of 96 slices shown]
[im 1/96]
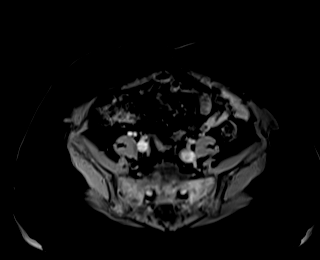
[im 96/96]
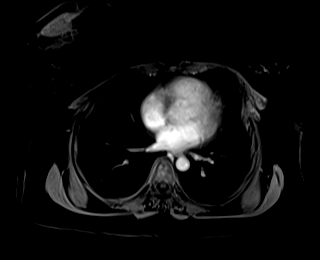

[Series 36: ax_dixon_delayed_w_reg_sub · axial · 3.0mm · 1.19mm/px · z∈[-25,+258]mm · 2 of 96 slices shown]
[im 1/96]
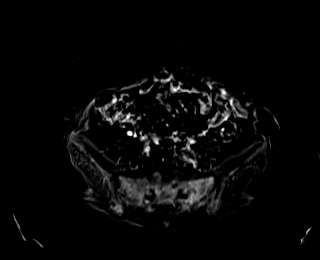
[im 96/96]
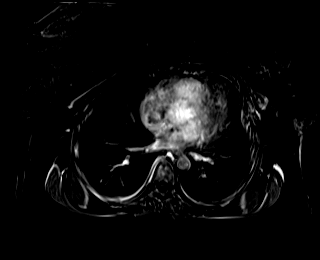

[48 of 48 positions shown; findings below may reference images not displayed]

FINDINGS: Lower chest: No acute abnormality at the lung bases.

Hepatobiliary: Mild hepatomegaly. No hepatic steatosis. Innumerable
indistinct small enhancing mildly T2 hyperintense liver masses
throughout the liver replacing much of the liver parenchyma
compatible with metastatic disease. Representative anterior segment
3 left liver 1.2 x 0.9 cm mass (series 6/image 17), posterior
segment 31.4 x 1.0 cm mass (series 6/image 16) and segment 6 right
liver 1.0 x 0.8 cm mass (series 6/image 22). Simple 1.0 cm segment 2
left liver cyst. Contracted gallbladder with mild diffuse
gallbladder wall thickening. No cholelithiasis. No pericholecystic
fluid. No biliary ductal dilatation. Common bile duct diameter 2 mm.
No choledocholithiasis. No biliary masses, strictures or beading.

Pancreas: No pancreatic mass or duct dilation.  No pancreas divisum.

Spleen: Normal size. No mass.

Adrenals/Urinary Tract: Normal adrenals. No hydronephrosis. Normal
kidneys with no renal mass.

Stomach/Bowel: Normal non-distended stomach. Visualized small and
large bowel is normal caliber, with no bowel wall thickening.

Vascular/Lymphatic: Normal caliber abdominal aorta. Patent portal,
splenic, hepatic and renal veins. No pathologically enlarged lymph
nodes in the abdomen.

Other: No abdominal ascites or focal fluid collection.

Musculoskeletal: Widespread patchy confluent enhancing lesions
throughout the visualized skeleton compatible with bone metastases.
IMPRESSION: 1. Innumerable indistinct small enhancing liver masses throughout
the liver replacing much of the liver parenchyma compatible with
metastatic disease. Mild hepatomegaly.
2. Widespread patchy confluent enhancing lesions throughout the
visualized skeleton compatible with bone metastases.
3. Contracted gallbladder with nonspecific mild diffuse gallbladder
wall thickening. No cholelithiasis. No biliary ductal dilatation. No
choledocholithiasis.

## 2021-01-12 MED ORDER — GADOBUTROL 1 MMOL/ML IV SOLN
8.0000 mL | Freq: Once | INTRAVENOUS | Status: AC | PRN
  Administered 2021-01-12: 8 mL via INTRAVENOUS

## 2021-01-14 ENCOUNTER — Inpatient Hospital Stay: Payer: Commercial Managed Care - PPO

## 2021-01-14 ENCOUNTER — Other Ambulatory Visit: Payer: Self-pay | Admitting: *Deleted

## 2021-01-14 ENCOUNTER — Inpatient Hospital Stay (HOSPITAL_BASED_OUTPATIENT_CLINIC_OR_DEPARTMENT_OTHER): Payer: Commercial Managed Care - PPO | Admitting: Oncology

## 2021-01-14 ENCOUNTER — Telehealth: Payer: Self-pay | Admitting: *Deleted

## 2021-01-14 ENCOUNTER — Other Ambulatory Visit: Payer: Self-pay

## 2021-01-14 VITALS — BP 127/82 | HR 62 | Temp 98.7°F | Resp 12

## 2021-01-14 DIAGNOSIS — C7951 Secondary malignant neoplasm of bone: Secondary | ICD-10-CM

## 2021-01-14 DIAGNOSIS — Z170 Estrogen receptor positive status [ER+]: Secondary | ICD-10-CM

## 2021-01-14 DIAGNOSIS — C50411 Malignant neoplasm of upper-outer quadrant of right female breast: Secondary | ICD-10-CM

## 2021-01-14 DIAGNOSIS — C50811 Malignant neoplasm of overlapping sites of right female breast: Secondary | ICD-10-CM

## 2021-01-14 DIAGNOSIS — E0500 Thyrotoxicosis with diffuse goiter without thyrotoxic crisis or storm: Secondary | ICD-10-CM

## 2021-01-14 DIAGNOSIS — C787 Secondary malignant neoplasm of liver and intrahepatic bile duct: Secondary | ICD-10-CM

## 2021-01-14 DIAGNOSIS — D582 Other hemoglobinopathies: Secondary | ICD-10-CM

## 2021-01-14 DIAGNOSIS — Z5111 Encounter for antineoplastic chemotherapy: Secondary | ICD-10-CM | POA: Diagnosis not present

## 2021-01-14 DIAGNOSIS — D5919 Other autoimmune hemolytic anemia: Secondary | ICD-10-CM

## 2021-01-14 LAB — COMPREHENSIVE METABOLIC PANEL
ALT: 178 U/L — ABNORMAL HIGH (ref 0–44)
AST: 161 U/L — ABNORMAL HIGH (ref 15–41)
Albumin: 2.9 g/dL — ABNORMAL LOW (ref 3.5–5.0)
Alkaline Phosphatase: 303 U/L — ABNORMAL HIGH (ref 38–126)
Anion gap: 13 (ref 5–15)
BUN: 19 mg/dL (ref 6–20)
CO2: 22 mmol/L (ref 22–32)
Calcium: 9.2 mg/dL (ref 8.9–10.3)
Chloride: 101 mmol/L (ref 98–111)
Creatinine, Ser: 0.88 mg/dL (ref 0.44–1.00)
GFR, Estimated: >60 mL/min (ref >60)
Glucose, Bld: 264 mg/dL — ABNORMAL HIGH (ref 70–99)
Potassium: 4.2 mmol/L (ref 3.5–5.1)
Sodium: 136 mmol/L (ref 135–145)
Total Bilirubin: 2.4 mg/dL — ABNORMAL HIGH (ref 0.3–1.2)
Total Protein: 6.9 g/dL (ref 6.5–8.1)

## 2021-01-14 LAB — SAVE SMEAR(SSMR), FOR PROVIDER SLIDE REVIEW

## 2021-01-14 LAB — CBC WITH DIFFERENTIAL/PLATELET
Abs Immature Granulocytes: 0.49 10*3/uL — ABNORMAL HIGH (ref 0.00–0.07)
Basophils Absolute: 0 10*3/uL (ref 0.0–0.1)
Basophils Relative: 0 %
Eosinophils Absolute: 0 10*3/uL (ref 0.0–0.5)
Eosinophils Relative: 0 %
HCT: 24.8 % — ABNORMAL LOW (ref 36.0–46.0)
Hemoglobin: 8.1 g/dL — ABNORMAL LOW (ref 12.0–15.0)
Immature Granulocytes: 4 %
Lymphocytes Relative: 30 %
Lymphs Abs: 4.2 10*3/uL — ABNORMAL HIGH (ref 0.7–4.0)
MCH: 28.6 pg (ref 26.0–34.0)
MCHC: 32.7 g/dL (ref 30.0–36.0)
MCV: 87.6 fL (ref 80.0–100.0)
Monocytes Absolute: 2.3 10*3/uL — ABNORMAL HIGH (ref 0.1–1.0)
Monocytes Relative: 17 %
Neutro Abs: 7 10*3/uL (ref 1.7–7.7)
Neutrophils Relative %: 49 %
Platelets: 130 10*3/uL — ABNORMAL LOW (ref 150–400)
RBC: 2.83 MIL/uL — ABNORMAL LOW (ref 3.87–5.11)
RDW: 21 % — ABNORMAL HIGH (ref 11.5–15.5)
WBC: 14.1 10*3/uL — ABNORMAL HIGH (ref 4.0–10.5)
nRBC: 41.3 % — ABNORMAL HIGH (ref 0.0–0.2)

## 2021-01-14 LAB — SAMPLE TO BLOOD BANK

## 2021-01-14 LAB — VITAMIN B12: Vitamin B-12: 2164 pg/mL — ABNORMAL HIGH (ref 180–914)

## 2021-01-14 LAB — RETICULOCYTES
Immature Retic Fract: 31.5 % — ABNORMAL HIGH (ref 2.3–15.9)
RBC.: 2.84 MIL/uL — ABNORMAL LOW (ref 3.87–5.11)
Retic Count, Absolute: 100.3 10*3/uL (ref 19.0–186.0)
Retic Ct Pct: 3.5 % — ABNORMAL HIGH (ref 0.4–3.1)

## 2021-01-14 LAB — FOLATE: Folate: 9.9 ng/mL (ref >5.9)

## 2021-01-14 LAB — IRON AND TIBC
Iron: 176 ug/dL — ABNORMAL HIGH (ref 41–142)
Saturation Ratios: 100 % — ABNORMAL HIGH (ref 21–57)
TIBC: 176 ug/dL — ABNORMAL LOW (ref 236–444)
UIBC: 1 ug/dL — ABNORMAL LOW (ref 120–384)

## 2021-01-14 LAB — LACTATE DEHYDROGENASE: LDH: 1981 U/L — ABNORMAL HIGH (ref 98–192)

## 2021-01-14 LAB — PREPARE RBC (CROSSMATCH)

## 2021-01-14 LAB — FERRITIN: Ferritin: 2994 ng/mL — ABNORMAL HIGH (ref 11–307)

## 2021-01-14 MED ORDER — DARBEPOETIN ALFA 500 MCG/ML IJ SOSY
500.0000 ug | PREFILLED_SYRINGE | Freq: Once | INTRAMUSCULAR | Status: AC
  Administered 2021-01-14: 500 ug via SUBCUTANEOUS

## 2021-01-14 MED ORDER — SODIUM CHLORIDE 0.9% IV SOLUTION
250.0000 mL | Freq: Once | INTRAVENOUS | Status: AC
  Administered 2021-01-14: 250 mL via INTRAVENOUS

## 2021-01-14 MED ORDER — ACETAMINOPHEN 325 MG PO TABS
650.0000 mg | ORAL_TABLET | Freq: Once | ORAL | Status: AC
  Administered 2021-01-14: 650 mg via ORAL

## 2021-01-14 MED ORDER — DIPHENHYDRAMINE HCL 25 MG PO CAPS
25.0000 mg | ORAL_CAPSULE | Freq: Once | ORAL | Status: AC
  Administered 2021-01-14: 25 mg via ORAL

## 2021-01-14 NOTE — Progress Notes (Signed)
START ON PATHWAY REGIMEN - Breast     A cycle is every 21 days:     Capecitabine   **Always confirm dose/schedule in your pharmacy ordering system**  Patient Characteristics: Distant Metastases or Locoregional Recurrent Disease - Unresected or Locally Advanced Unresectable Disease Progressing after Neoadjuvant and Local Therapies, HER2 Negative/Unknown/Equivocal, ER Positive, Chemotherapy, First Line Therapeutic Status: Distant Metastases ER Status: Positive (+) HER2 Status: Negative (-) PR Status: Negative (-) Therapy Approach Indicated: Standard Chemotherapy/Endocrine Therapy Line of Therapy: First Line Intent of Therapy: Non-Curative / Palliative Intent, Discussed with Patient

## 2021-01-14 NOTE — Progress Notes (Signed)
Pt being scheduled for 2nd unit of blood on 01/16/2021

## 2021-01-14 NOTE — Progress Notes (Signed)
[place]  [phone] [phone]     ID: [name] [name] DOB: [birth year]  MR#: [id number]  KXF#:[id number]  Patient Care Team: Townsend Roger, MD as PCP - General (Internal Medicine) Derwood Kaplan, MD as PCP - Hematology/Oncology (Oncology) Orian Amberg, Virgie Dad, MD as Consulting Physician (Oncology) Jacelyn Pi, MD as Referring Physician (Endocrinology) Armandina Gemma, MD as Consulting Physician (General Surgery) Servando Salina, MD as Consulting Physician (Obstetrics and Gynecology) Raina Mina, RPH-CPP (Pharmacist) Chauncey Cruel, MD OTHER MD:  CHIEF COMPLAINT: metastatic breast cancer, estrogen receptor positive  CURRENT TREATMENT: letrozole, palbociclib, goserelin, denosumab   INTERVAL HISTORY: [name] returns today for follow up of her metastatic breast cancer.    Since her last visit, she underwent liver MRI on 01/12/2021 showing: innumerable indistinct small enhancing liver masses throughout the liver replacing much of the liver parenchyma; widespread patchy confluent enhancing lesions throughout the visualized skeleton.  She is scheduled for brain MRI tomorrow, 01/15/2021.  We are following her hemoglobin closely.  She received a blood transfusion on 11/22/2020 and 12/27/2020.  She started RNS on 11/27/2020 and received a second dose 12/26/2020 with a third dose today. Lab Results  Component Value Date   HGB 8.1 (L) 01/14/2021   HGB 8.0 (L) 01/06/2021   HGB 6.8 (LL) 12/26/2020   HGB 9.8 (L) 11/27/2020   HGB 7.0 (L) 11/20/2020    REVIEW OF SYSTEMS: [name] is still working full-time but remains very fatigued.  Now in addition she is discouraged and anxious because of the results of her recent liver MRI.  She does not have taste alteration nausea vomiting or abdominal pain.  There has been no change in bowel or bladder habits.  A detailed review of systems was otherwise stable.  COVID 19 VACCINATION STATUS: Status post  Moderna x3, third dose October 2021; booster pending   HISTORY OF CURRENT ILLNESS: From the [place] note dated 04/10/2020 by Dickey Gave, PA-C:   "[name] K Montemayor is a 40 y.o. female African American female with stage IV (T3 N1 M1) hormone receptor positive right breast cancer diagnosed in December 2020. We began seeing in November 2020 for leukocytosis, anemia and thrombocytopenia.  She had bruising and nose bleeds, as well as a 10-15 lb weight loss.  She had been having chronic back pain for several months, for which she was using ibuprofen or Aleve.  LDH was markedly elevated at 2599, reticulocyte count mildly elevated at 3.4% and haptoglobin was less than 10, which was consistent with hemolysis, but Coombs was negative.  CT chest, abdomen and pelvis revealed diffusely sclerotic osseous structures with multiple lytic lesions noted throughout the axial skeleton.  Some lesions demonstrated expansile soft tissue components, and these findings are highly concerning for multiple myeloma or other osseous metastatic disease of uncertain origin.  A spiculated appearing mass or tissue element was also seen in the lateral right breast, 9 o'clock.  Bone marrow biopsy revealed abundant metastatic carcinoma, which was estrogen receptor positive, so consistent with breast cancer origin.  With that information, it became clear that she had malignant breast cancer that had metastasized to the bone.  She underwent a digital diagnostic bilateral mammogram with right breast ultrasound in December, which revealed highly suspicious calcifications spanning at least 9.2 cm in the upper outer quadrant of the right breast with distortion associated with the posterior calcifications.  A discrete mass was seen in the right breast with ultrasound at 10 o'clock, 8 cm from  the nipple measuring 9 x 6 x 7 mm.  Several mildly abnormal nodes are seen in the right axilla with cortices measuring between 3 and 5 mm.   The lymph nodes were symmetric on mammography and were not definitely different compared to 2015.  Stereotactic needle core biopsy of the right breast and right axilla confirmed grade 2, invasive ductal carcinoma, as well as ductal carcinoma in situ.  The invasive component measured 0.9 cm in greatest linear extent on the core biopsy.  Metastatic carcinoma was involved in one lymph node.  Estrogen receptor positive at 90%, progesterone receptor positive at 1%, and HER2 negative.  Ki67 was 15%.  Nuclear medicine PET scan revealed widespread hypermetabolic bony metastatic disease.  Low level hypermetabolism in bilateral axillary nodes, right greater than left was seen with uptake identified in the ill defined soft tissue lesion in the lateral right breast.  Foci of hypermetabolism identified in the central uterus along the IUD and along the anterior cervix and adjacent small bowel, which were nonspecific.  MRI head did not reveal intracranial metastasis, but diffusely abnormal bone marrow in the skull and cervical spine likely due to metastatic disease was observed.  CA 27-29 was not elevated.   Due to her age of diagnosis, she underwent testing for hereditary breast cancer with the [employer]  myRisk hereditary cancer panel test.  This did not reveal any clinically significant mutation.  There were variants of uncertain significance of the ATM, AXIN2 and MSH3 genes.   She had severer pain, so was placed on methadone, in addition to oxycodone and the doses were titrated up.  We recommended letrozole/palbociclib, denosumab monthly for the bone metastasis and goserelin monthly to suppress ovarian function due to being premenopausal.  She started letrozole on December 28th and denosumab and goserelin on January 7th.  She started palbociclib for 3 weeks on and one week off on January 9th.   She develops severe hypocalcemia, which required IV calcium replacement. Denosumab had to be held due to the severe hypocalcemia, but  was resumed in March.  She was placed on high doses of oral calcium.  She had multiple other issues, such as decreased appetite, nausea and vomiting, as well  As dehydration, managed with medications. MRI thoracic and lumbar spine on January 19th revealed diffusely decreased T1 bone marrow signal with fairly homogeneous enhancement throughout the thoracolumbar spine.  Prior PET-CT demonstrated homogeneous hypermetabolic activity throughout the thoracolumbar spine.  These findings are favored to be related to the patient's anemia rather than diffuse metastatic disease.  There were possible small osseous metastases, notably within the T3 spinous process, left L2 pedicle and upper right sacrum.  There was no significant disc herniation, spinal stenosis or nerve root encroachment.  She was subsequently weaned off methadone and has not had recurrent pain.  She developed cytopenias due to palbociclib and we occasionally had to delay cycles   CT chest, abdomen and pelvis in May revealed diffuse patchy confluent sclerotic osseous metastatic disease throughout the axial and proximal appendicular skeleton, probably representing treatment effect.  ThereWere healed deformities throughout the bilateral ribs.  A new mild right axillary lymphadenopathy was also observed, which wasnonspecific.  No additional sites of metastatic disease were seen in the chest, abdomen or pelvis.  She had a  tubal ligation and removal of her Mirena IUD in June.  CT chest, abdomen and pelvis in September revealed again widespread osseous sclerosis, indicative of treated metastatic disease.  The previous borderline enlarged right axillary lymph nodes  had resolved.  She had recurrent cytopenias, and since we had to delay multiple cycles of palbociclib, we decreased her palbociclib dose to 100 mg daily for 3 weeks on a week off."  The patient's subsequent history is as detailed below.   PAST MEDICAL HISTORY: Past Medical History:  Diagnosis Date    Anemia    History of cardiac murmur as a child    History of Graves' disease    dx hyperthyroidism during 4th pregnancy;   11-15-2013  s/p  total thyroidectomy   Hypothyroidism, postsurgical 11/15/2013   endocrinologist--- dr balan   Pre-diabetes    Stage IV breast cancer in female [facility]) 05/2019   oncologist-- dr c. Hinton Rao ([facility] [place] [facility]) bone marrow bx 05-08-2019 and right breast bx 05-17-2020--- primary breast w/ mets to bones, pt taking oral chemo (ibrance)  Tumor associated hemolysis at presentation December 2020   PAST SURGICAL HISTORY: Past Surgical History:  Procedure Laterality Date   IUD REMOVAL N/A 11/22/2019   Procedure: INTRAUTERINE DEVICE (IUD) REMOVAL;  Surgeon: Servando Salina, MD;  Location: [place];  Service: Gynecology;  Laterality: N/A;   LAPAROSCOPIC TUBAL LIGATION Bilateral 11/22/2019   Procedure: LAPAROSCOPIC TUBAL LIGATION By Cautery;  Surgeon: Servando Salina, MD;  Location: [place];  Service: Gynecology;  Laterality: Bilateral;   THYROIDECTOMY Bilateral 11/15/2013   Procedure: TOTAL THYROIDECTOMY;  Surgeon: Earnstine Regal, MD;  Location: [facility];  Service: General;  Laterality: Bilateral;   WISDOM TOOTH EXTRACTION  2012    FAMILY HISTORY: Family History  Problem Relation Age of Onset   Cancer Mother        Peripheral T cell Lymphoma   Diabetes Father    Diabetes Maternal Grandmother   The patient's mother died at age 20 shortly after her diagnosis of T-cell non-Hodgkin's lymphoma. The patient's father is 70 years old as of December 2020. He has a history of diabetes, is a bilateral amputee, and has a history of EtOH abuse. The patient has 2 half siblings on her mother side and 3/2 siblings on her father's side. None of them have a history of cancer. She has a maternal cousin with a history of B-cell non-Hodgkin's lymphoma and a maternal grandfather with prostate cancer   GYNECOLOGIC HISTORY:  Patient's  last menstrual period was 10/26/2018. Menarche: 40 years old Age at first live birth: 40 years old [place] P 4 LMP December 2020 (when goserelin started Contraceptive: Status post bilateral tubal ligation HRT n/a  Hysterectomy? no BSO? no   SOCIAL HISTORY: (updated 04/2020)  [name] is divorced. Her former husband Abiha Lukehart is a Armed forces technical officer for the [employer]. 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  She works on Friday Saturdays and Sundays only.  Her daughters Jeanella Cara, and [place], age 35-9 as of December 2021. They stay 1 week with the patient in 1 week with her ex-husband. The patient also has a burn doodle called Karlene Lineman. The patient is a Psychologist, forensic    ADVANCED DIRECTIVES: Not in place. At the 05/06/2020 visit the patient was given the appropriate documents to complete and notarized at her discretion.   HEALTH MAINTENANCE: Social History   Tobacco Use   Smoking status: Never   Smokeless tobacco: Never  Vaping Use   Vaping Use: Never used  Substance Use Topics   Alcohol use: Not Currently    Comment: social   Drug use: Never     Colonoscopy: n/a (age)  PAP: Up-to-date  Bone density:  Remote   No Known Allergies  Current Outpatient Medications  Medication Sig Dispense Refill   b complex vitamins capsule Take 1 capsule by mouth daily.     Calcium Carb-Cholecalciferol (CALCIUM 600 + D PO) Take 4 tablets by mouth daily.     Cholecalciferol (VITAMIN D3) 25 MCG (1000 UT) CAPS Take 3 capsules by mouth daily.     Denosumab (XGEVA [place]) Inject into the skin every 30 (thirty) days.     dexamethasone (DECADRON) 4 MG tablet Take 1 tablet (4 mg total) by mouth 2 (two) times daily with a meal. 20 tablet 0   Goserelin Acetate (ZOLADEX ) Inject into the skin every 30 (thirty) days.     ibuprofen (ADVIL) 800 MG tablet Take 1 tablet (800 mg total) by mouth every 8 (eight) hours as needed for moderate pain. (Patient not taking: Reported on 01/08/2021) 30 tablet 5    levothyroxine (SYNTHROID) 112 MCG tablet 1 tablet in the morning on an empty stomach     metFORMIN (GLUCOPHAGE) 500 MG tablet Take 1 tablet (500 mg total) by mouth 2 (two) times daily with a meal.     ondansetron (ZOFRAN) 8 MG tablet TAKE 1 TABLET (8 MG TOTAL) BY MOUTH 3 (THREE) TIMES DAILY AS NEEDED FOR NAUSEA OR VOMITING. 18 tablet 1   valACYclovir (VALTREX) 500 MG tablet Take 1 tablet (500 mg total) by mouth daily. (Patient not taking: Reported on 01/08/2021) 90 tablet 4   No current facility-administered medications for this visit.    OBJECTIVE: African-American woman examined in the infusion area  For vitals associated with the 01/14/2021 visit please see the infusion area flowsheet  There were no vitals filed for this visit.    There is no height or weight on file to calculate BMI.   Wt Readings from Last 3 Encounters:  01/08/21 185 lb 11.2 oz (84.2 kg)  12/26/20 189 lb 9.6 oz (86 kg)  11/27/20 196 lb 12.8 oz (89.3 kg)     ECOG FS:1 - Symptomatic but completely ambulatory  Sclerae unicteric, EOMs intact Wearing a mask Lungs no rales or rhonchi Heart regular rate and rhythm Abd soft, nontender, positive bowel sounds Neuro: nonfocal, well oriented, tearful but appropriate appropriate affect Breasts: Deferred   LAB RESULTS:  CMP     Component Value Date/Time   NA 136 01/14/2021 1412   NA 140 04/16/2020 0000   K 4.2 01/14/2021 1412   CL 101 01/14/2021 1412   CO2 22 01/14/2021 1412   GLUCOSE 264 (H) 01/14/2021 1412   BUN 19 01/14/2021 1412   BUN 18 04/16/2020 0000   CREATININE 0.88 01/14/2021 1412   CREATININE 0.93 11/11/2020 1034   CALCIUM 9.2 01/14/2021 1412   PROT 6.9 01/14/2021 1412   ALBUMIN 2.9 (L) 01/14/2021 1412   AST 161 (H) 01/14/2021 1412   AST 38 11/11/2020 1034   ALT 178 (H) 01/14/2021 1412   ALT 33 11/11/2020 1034   ALKPHOS 303 (H) 01/14/2021 1412   BILITOT 2.4 (H) 01/14/2021 1412   BILITOT 0.3 11/11/2020 1034   GFRNONAA >60 01/14/2021 1412    GFRNONAA >60 11/11/2020 1034   GFRAA >60 04/28/2019 1218    No results found for: TOTALPROTELP, ALBUMINELP, A1GS, A2GS, BETS, BETA2SER, GAMS, MSPIKE, SPEI  Lab Results  Component Value Date   WBC 14.1 (H) 01/14/2021   NEUTROABS 7.0 01/14/2021   HGB 8.1 (L) 01/14/2021   HCT 24.8 (L) 01/14/2021   MCV 87.6 01/14/2021   PLT 130 (L) 01/14/2021  No results found for: LABCA2  No components found for: FOYDXA128  No results for input(s): INR in the last 168 hours.  No results found for: LABCA2  No results found for: NOM767  No results found for: CAN125  No results found for: MCN470  Lab Results  Component Value Date   CA2729 16.2 11/27/2020    No components found for: HGQUANT  No results found for: CEA1 / No results found for: CEA1   No results found for: AFPTUMOR  No results found for: CHROMOGRNA  No results found for: KPAFRELGTCHN, LAMBDASER, KAPLAMBRATIO (kappa/lambda light chains)  No results found for: HGBA, HGBA2QUANT, HGBFQUANT, HGBSQUAN (Hemoglobinopathy evaluation)   Lab Results  Component Value Date   LDH 1,981 (H) 01/14/2021    Lab Results  Component Value Date   IRON 176 (H) 01/14/2021   TIBC 176 (L) 01/14/2021   IRONPCTSAT 100 (H) 01/14/2021   (Iron and TIBC)  Lab Results  Component Value Date   FERRITIN 2,994 (H) 01/14/2021    Urinalysis    Component Value Date/Time   COLORURINE YELLOW 04/28/2019 1223   APPEARANCEUR HAZY (A) 04/28/2019 1223   LABSPEC 1.024 04/28/2019 1223   PHURINE 5.0 04/28/2019 1223   GLUCOSEU NEGATIVE 04/28/2019 1223   HGBUR NEGATIVE 04/28/2019 1223   BILIRUBINUR NEGATIVE 04/28/2019 [place] 04/28/2019 [place] 04/28/2019 1223   NITRITE NEGATIVE 04/28/2019 1223   LEUKOCYTESUR NEGATIVE 04/28/2019 1223    STUDIES: MR LIVER W WO CONTRAST  Result Date: 01/13/2021 CLINICAL DATA:  Metastatic breast cancer. Evaluate possible liver metastasis. EXAM: MRI ABDOMEN WITHOUT AND WITH  CONTRAST TECHNIQUE: Multiplanar multisequence MR imaging of the abdomen was performed both before and after the administration of intravenous contrast. CONTRAST:  36m GADAVIST GADOBUTROL 1 MMOL/ML IV SOLN COMPARISON:  02/13/2020 CT chest, abdomen and pelvis. FINDINGS: Lower chest: No acute abnormality at the lung bases. Hepatobiliary: Mild hepatomegaly. No hepatic steatosis. Innumerable indistinct small enhancing mildly T2 hyperintense liver masses throughout the liver replacing much of the liver parenchyma compatible with metastatic disease. Representative anterior segment 3 left liver 1.2 x 0.9 cm mass (series 6/image 17), posterior segment 31.4 x 1.0 cm mass (series 6/image 16) and segment 6 right liver 1.0 x 0.8 cm mass (series 6/image 22). Simple 1.0 cm segment 2 left liver cyst. Contracted gallbladder with mild diffuse gallbladder wall thickening. No cholelithiasis. No pericholecystic fluid. No biliary ductal dilatation. Common bile duct diameter 2 mm. No choledocholithiasis. No biliary masses, strictures or beading. Pancreas: No pancreatic mass or duct dilation.  No pancreas divisum. Spleen: Normal size. No mass. Adrenals/Urinary Tract: Normal adrenals. No hydronephrosis. Normal kidneys with no renal mass. Stomach/Bowel: Normal non-distended stomach. Visualized small and large bowel is normal caliber, with no bowel wall thickening. Vascular/Lymphatic: Normal caliber abdominal aorta. Patent portal, splenic, hepatic and renal veins. No pathologically enlarged lymph nodes in the abdomen. Other: No abdominal ascites or focal fluid collection. Musculoskeletal: Widespread patchy confluent enhancing lesions throughout the visualized skeleton compatible with bone metastases. IMPRESSION: 1. Innumerable indistinct small enhancing liver masses throughout the liver replacing much of the liver parenchyma compatible with metastatic disease. Mild hepatomegaly. 2. Widespread patchy confluent enhancing lesions throughout the  visualized skeleton compatible with bone metastases. 3. Contracted gallbladder with nonspecific mild diffuse gallbladder wall thickening. No cholelithiasis. No biliary ductal dilatation. No choledocholithiasis. Electronically Signed   By: JIlona SorrelM.D.   On: 01/13/2021 10:24     ELIGIBLE FOR AVAILABLE RESEARCH PROTOCOL: no  ASSESSMENT: 39  y.o. [place] woman presenting NOV 2021 with stage IV breast cancer as follows:  (a) workup of initial pancytopenia and hemolysis showed a leukoerythroblastic blood picture with bone marrow biopsy 05/07/2020 confirming metastatic carcinoma, estrogen receptor positive; cytogenetics were normal  (b) CT scans at baseline showing multiple bone lesions (both sclerotic and lytic) and a possible mass in the right breast; no definitive lung or liver involvement  (c) right breast upper outer quadrant biopsy x2 and right axillary lymph node biopsy on 05/18/2019 confirmed a clinical 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, grade 1-2, estrogen receptor strongly positive, progesterone receptor moderately positive (1%), HER-2 negative, with an MIB-1 of 2-15%  (d) CA 27-29 was not informative (repeat 05/06/2020 WNL)  (1) letrozole started 05/28/2019  (a) goserelin started 06/07/2019, repeated every 4 weeks  (b) palbociclib added 06/09/2019  (c) palbociclib dose decreased to 100 mg daily, 21/7, with September 2021 cycle  (d)   (2) denosumab/Xgeva started 06/07/2019, repeated every 4 weeks  (3) genetics testing through the [facility] MyRisk panel dated 09/22/2019 found no deleterious mutations in the genes tested which included BRCA 1 and 2, CHEK2, PALB 2 and TP53 among others.  (a) variants of uncertain significance were found in ATM, AXN 2, and [place] 3.  (4) restaging studies:  (a) chest CT and bone scan 05/21/2020 shows no visceral disease, stable bone lesions  (b) bilateral mammography and right breast ultrasonography 06/25/2020 shows the measurable disease in the  right breast to have decreased.  There is some progressive calcifications that require monitoring  (c) bone scan 11/21/2020 shows no new bone lesions  (d) breast ultrasound 11/12/2020 shows the previously noted 10:00 mass to have resolved.  The right axillary adenopathy also has resolved.  There is some increase in pleomorphic calcifications as previously noted  (e) MRI of the liver shows innumerable small enhancing liver masses consistent with metastatic disease; bone metastases were again noted  (f) brain MRI 01/15/2021  (5) chemotherapy associated anemia, requiring transfusion:  (a) EPO started 11/27/2020, repeated monthly   PLAN: [name] is now coming up in a year from initial diagnosis of metastatic disease.  She was treated with letrozole undercover of goserelin and also palbociclib and also received Xgeva.  The recent MRI of the liver shows evidence of disease progression although we do not have a prior liver MRI to compare it with.  Her most recent bone scan in June was stable.  She has a brain MRI pending tomorrow.  At this point we are stopping the letrozole and palbociclib.  We are continuing the goserelin and denosumab/Xgeva.  We need liver biopsy first to ascertain whether that disease is indeed breast cancer and whether it is still estrogen receptor positive.  We can also send that tissue for foundation 1 and PD-L1 testing.  Otherwise I think at this point I would like to move to the chemotherapy column and today we discussed capecitabine in detail.  She has a good understanding of the possible toxicities side effects and complications of this agent.  I entered the protocol for her and hope we can get it started by the time she returns to see me  She also needs blood product support and will receive a PRBC transfusion today and later this week.  Total encounter time 25 minutes.Sarajane Jews C. Morgane Joerger, MD 01/14/21 6:08 PM Medical Oncology and Hematology [facility] [place], [place] [address] Tel. [phone]    Fax. [phone]   Penny Pia  [place], am acting as scribe for Dr. Sarajane Jews C. Nitish Roes.  I, Lurline Del MD, have reviewed the above documentation for accuracy and completeness, and I agree with the above.    *Total Encounter Time as defined by the Centers for Medicare and Medicaid Services includes, in addition to the face-to-face time of a patient visit (documented in the note above) non-face-to-face time: obtaining and reviewing outside history, ordering and reviewing medications, tests or procedures, care coordination (communications with other health care professionals or caregivers) and documentation in the medical record.

## 2021-01-14 NOTE — Patient Instructions (Signed)
https://www.[facility].org/donate-blood/blood-donation-process/what-happens-to-donated-blood/blood-transfusions/types-of-blood-transfusions.html"> https://www.[facility].org/donate-blood/blood-donation-process/what-happens-to-donated-blood/blood-transfusions/risks-complications.html">  Blood Transfusion, Adult, Care After This sheet gives you information about how to care for yourself after your procedure. Your health care provider may also give you more specific instructions. If you have problems or questions, contact your health careprovider. What can I expect after the procedure? After the procedure, it is common to have: Bruising and soreness where the IV was inserted. A fever or chills on the day of the procedure. This may be your body's response to the new blood cells received. A headache. Follow these instructions at home: IV insertion site care     Follow instructions from your health care provider about how to take care of your IV insertion site. Make sure you: Wash your hands with soap and water before and after you change your bandage (dressing). If soap and water are not available, use hand sanitizer. Change your dressing as told by your health care provider. Check your IV insertion site every day for signs of infection. Check for: Redness, swelling, or pain. Bleeding from the site. Warmth. Pus or a bad smell. General instructions Take over-the-counter and prescription medicines only as told by your health care provider. Rest as told by your health care provider. Return to your normal activities as told by your health care provider. Keep all follow-up visits as told by your health care provider. This is important. Contact a health care provider if: You have itching or red, swollen areas of skin (hives). You feel anxious. You feel weak after doing your normal activities. You have redness, swelling, warmth, or pain around the IV insertion site. You have blood coming  from the IV insertion site that does not stop with pressure. You have pus or a bad smell coming from your IV insertion site. Get help right away if: You have symptoms of a serious allergic or immune system reaction, including: Trouble breathing or shortness of breath. Swelling of the face or feeling flushed. Fever or chills. Pain in the head, back, or chest. Dark urine or blood in the urine. Widespread rash. Fast heartbeat. Feeling dizzy or light-headed. If you receive your blood transfusion in an outpatient setting, you will betold whom to contact to report any reactions. These symptoms may represent a serious problem that is an emergency. Do not wait to see if the symptoms will go away. Get medical help right away. Call your local emergency services (911 in the [place] Do not drive yourself to the hospital. Summary Bruising and tenderness around the IV insertion site are common. Check your IV insertion site every day for signs of infection. Rest as told by your health care provider. Return to your normal activities as told by your health care provider. Get help right away for symptoms of a serious allergic or immune system reaction to blood transfusion. This information is not intended to replace advice given to you by your health care provider. Make sure you discuss any questions you have with your healthcare provider. Document Revised: 11/09/2018 Document Reviewed: 11/09/2018 Elsevier Patient Education  2022 [employer] Inc[employer]  

## 2021-01-14 NOTE — Progress Notes (Signed)
Per Dr. Jana Hakim, ok to run blood over 1 hour.

## 2021-01-15 ENCOUNTER — Telehealth: Payer: Self-pay | Admitting: Oncology

## 2021-01-15 ENCOUNTER — Encounter (HOSPITAL_COMMUNITY): Payer: Self-pay | Admitting: Radiology

## 2021-01-15 ENCOUNTER — Other Ambulatory Visit: Payer: Self-pay | Admitting: Oncology

## 2021-01-15 ENCOUNTER — Other Ambulatory Visit: Payer: Self-pay | Admitting: Pharmacist

## 2021-01-15 ENCOUNTER — Other Ambulatory Visit (HOSPITAL_COMMUNITY): Payer: Self-pay

## 2021-01-15 ENCOUNTER — Ambulatory Visit (HOSPITAL_COMMUNITY)
Admission: RE | Admit: 2021-01-15 | Discharge: 2021-01-15 | Disposition: A | Discharge status: Home / Self Care | Payer: Commercial Managed Care - PPO | Source: Ambulatory Visit | Attending: Adult Health | Admitting: Adult Health

## 2021-01-15 DIAGNOSIS — C787 Secondary malignant neoplasm of liver and intrahepatic bile duct: Secondary | ICD-10-CM

## 2021-01-15 DIAGNOSIS — C50411 Malignant neoplasm of upper-outer quadrant of right female breast: Secondary | ICD-10-CM | POA: Diagnosis present

## 2021-01-15 DIAGNOSIS — C7951 Secondary malignant neoplasm of bone: Secondary | ICD-10-CM | POA: Diagnosis present

## 2021-01-15 DIAGNOSIS — C50811 Malignant neoplasm of overlapping sites of right female breast: Secondary | ICD-10-CM

## 2021-01-15 DIAGNOSIS — Z170 Estrogen receptor positive status [ER+]: Secondary | ICD-10-CM

## 2021-01-15 LAB — PREPARE RBC (CROSSMATCH)

## 2021-01-15 IMAGING — MR MR HEAD WO/W CM
15 series · 48 of 48 positions shown · IV contrast (8ML GADAVIST)
Comparison: TASNIM brain MRI and PET-CT [DATE]

CLINICAL DATA: 39-year-old female with metastatic breast cancer.
Staging.

EXAM:
MRI HEAD WITHOUT AND WITH CONTRAST
TECHNIQUE: Multiplanar, multiecho pulse sequences of the brain and surrounding
structures were obtained without and with intravenous contrast.
CONTRAST:  8mL GADAVIST GADOBUTROL 1 MMOL/ML IV SOLN

[Series 5: DWI · axial · 3.0mm · 1.36mm/px · z∈[-77,+62]mm · 5 of 96 slices shown (1 of 2)]
[im 1/96]
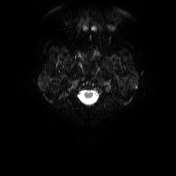
[im 24/96]
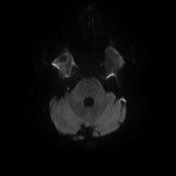
[im 48/96]
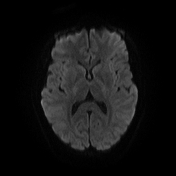
[im 72/96]
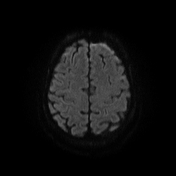
[im 96/96]
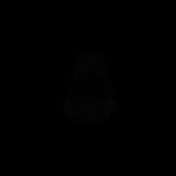

[Series 6: DWI · axial · 3.0mm · 1.36mm/px · z∈[-77,+62]mm · 3 of 46 slices shown (2 of 2)]
[im 1/46]
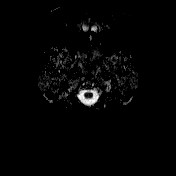
[im 23/46]
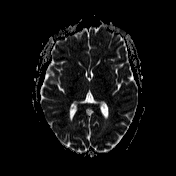
[im 46/46]
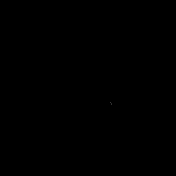

[Series 7: T1 · sagittal · 5.0mm · 0.75mm/px · 1 of 24 slices shown (1 of 4)]
[im 1/24]
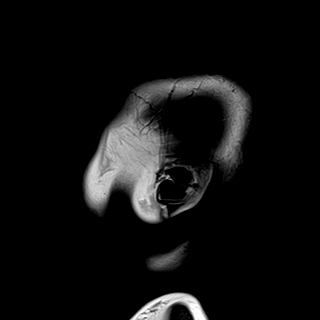

[Series 8: T2 · axial · 5.0mm · 0.75mm/px · 1 of 22 slices shown]
[im 1/22]
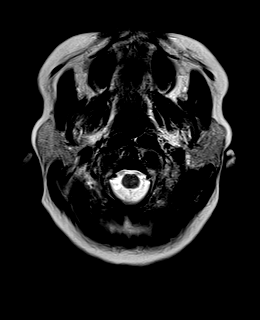

[Series 9: swi_images · axial · 3.0mm · 0.75mm/px · z∈[-85,+66]mm · 3 of 52 slices shown]
[im 1/52]
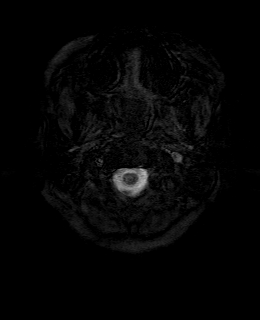
[im 26/52]
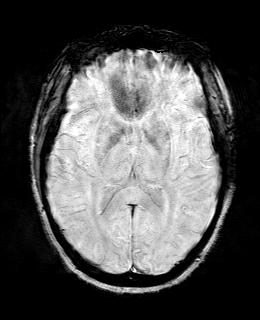
[im 52/52]
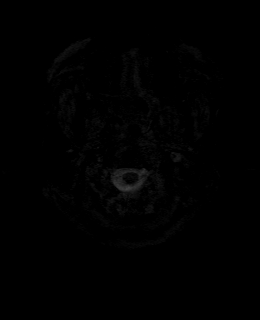

[Series 11: FLAIR · axial · 3.0mm · 0.75mm/px · z∈[-85,+66]mm · 3 of 52 slices shown]
[im 1/52]
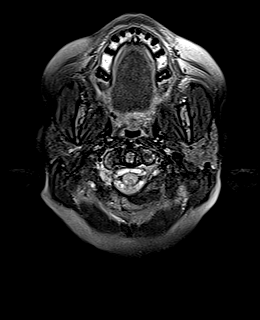
[im 26/52]
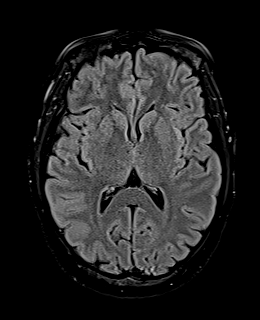
[im 52/52]
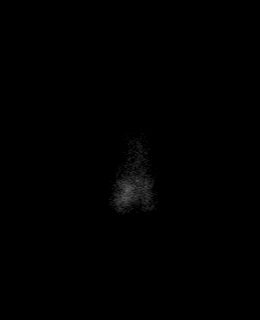

[Series 12: T1 · axial · 1.0mm · 0.94mm/px · z∈[-81,+61]mm · 9 of 144 slices shown (2 of 4)]
[im 1/144]
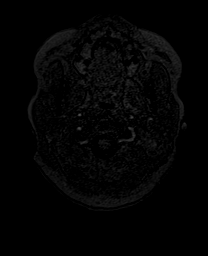
[im 18/144]
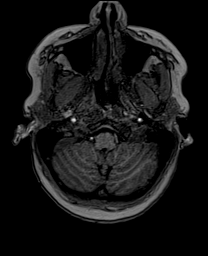
[im 36/144]
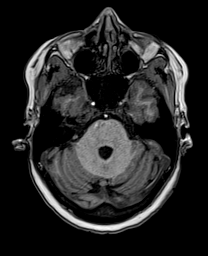
[im 54/144]
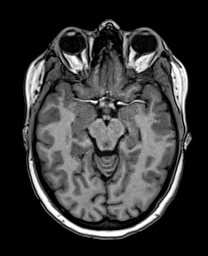
[im 72/144]
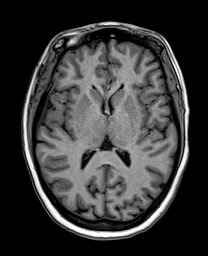
[im 90/144]
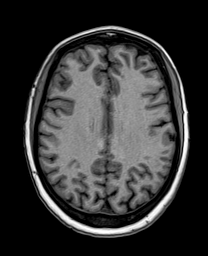
[im 108/144]
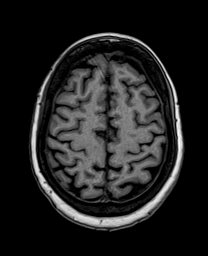
[im 126/144]
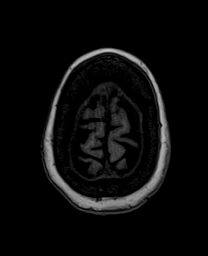
[im 144/144]
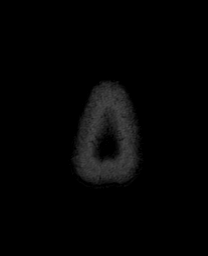

[Series 13: cor dwi_tracew · coronal · 5.0mm · 1.53mm/px · 3 of 56 slices shown]
[im 1/56]
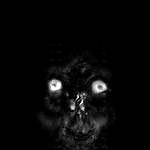
[im 28/56]
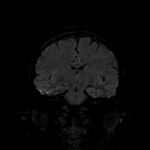
[im 56/56]
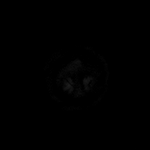

[Series 14: cor dwi_adc · coronal · 5.0mm · 1.53mm/px · 2 of 28 slices shown]
[im 1/28]
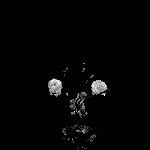
[im 28/28]
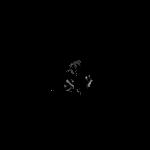

[Series 15: T2 post-contrast · coronal · 5.0mm · 0.69mm/px · 2 of 28 slices shown]
[im 1/28]
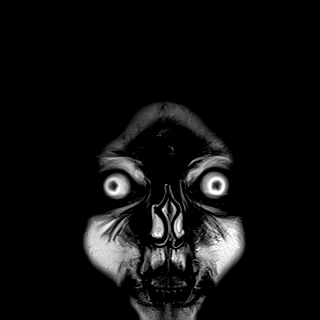
[im 28/28]
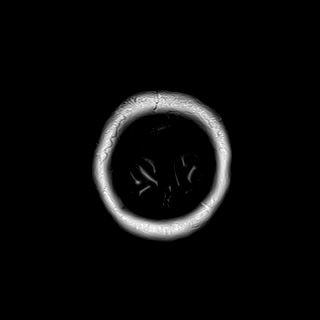

[Series 16: T1 post-contrast · axial · 1.0mm · 0.94mm/px · z∈[-81,+61]mm · 9 of 144 slices shown (1 of 3)]
[im 1/144]
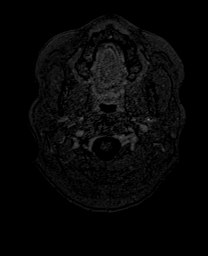
[im 18/144]
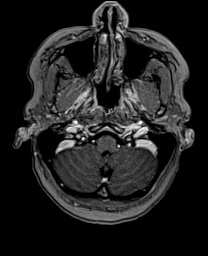
[im 36/144]
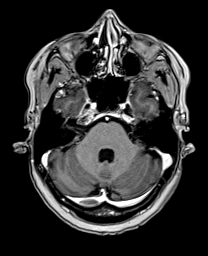
[im 54/144]
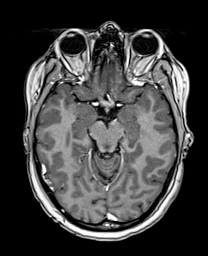
[im 72/144]
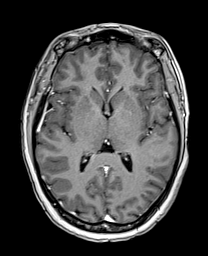
[im 90/144]
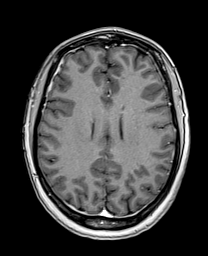
[im 108/144]
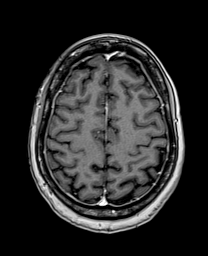
[im 126/144]
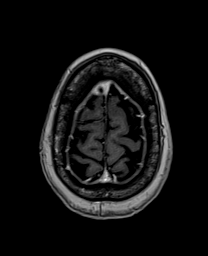
[im 144/144]
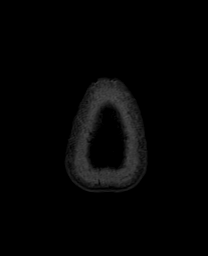

[Series 17: T1 · sagittal · 4.0mm · 0.94mm/px · 2 of 30 slices shown (3 of 4)]
[im 1/30]
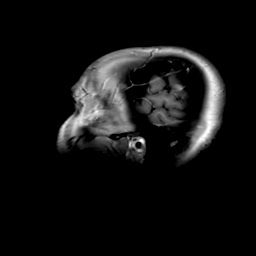
[im 30/30]
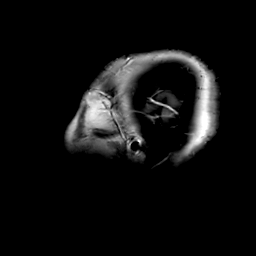

[Series 18: T1 · coronal · 4.0mm · 0.94mm/px · 2 of 30 slices shown (4 of 4)]
[im 1/30]
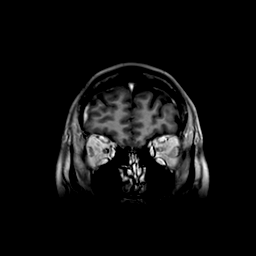
[im 30/30]
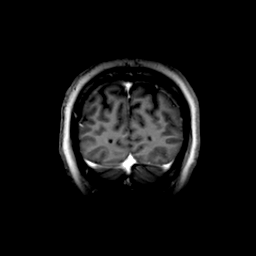

[Series 19: T1 post-contrast · coronal · 5.0mm · 0.43mm/px · 2 of 28 slices shown (2 of 3)]
[im 1/28]
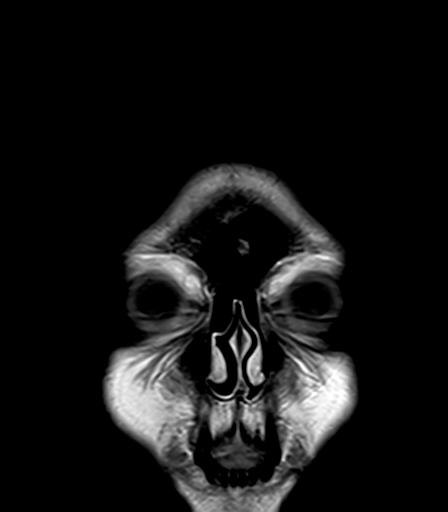
[im 28/28]
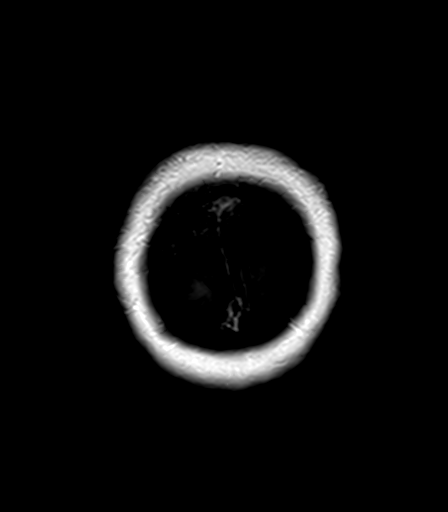

[Series 20: T1 post-contrast · sagittal · 5.0mm · 0.75mm/px · 1 of 24 slices shown (3 of 3)]
[im 1/24]
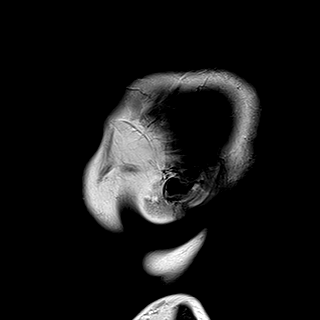

[48 of 48 positions shown; findings below may reference images not displayed]

FINDINGS: Brain: There is a mildly undulating appearance of the enhancing
packing meninges along the frontal convexities, more so on the right
(series 16, image 88). And 1 of these rounded areas of dural
thickening (that seen on series 16, image 101) is stable since [9J]
and appears benign. However, a larger 10 mm area along the right
inferior frontal gyrus on series 16, image 81 is new but only 3 mm
thick. This lesion is visible on the coronal postcontrast series 19,
image 6 (the more inferior and lateral) where similar superior dural
nodularity appears fairly stable since the [9J] postcontrast
coronal. Furthermore, contralateral left frontal convexity mild
dural thickening and nodularity (series 16, image 99) also appears
similar [9J]. The bilateral dural changes are also visible on axial
FLAIR (series 11, images 32 and 35).

No abnormal intra-axial enhancement (vascular related enhancement
suspected in the left basal ganglia on series 16, image 74). No
cerebral edema.

No restricted diffusion to suggest acute infarction. No midline
shift, mass effect, ventriculomegaly, extra-axial collection or
acute intracranial hemorrhage. Cervicomedullary junction and
pituitary are within normal limits.

Scattered small mostly subcortical white matter T2 and FLAIR
hyperintensity in both hemispheres has mildly increased since [9J],
now mild to moderate for age. This is in a nonspecific
configuration. No chronic cerebral blood products. No cortical
encephalomalacia.

No restricted diffusion to suggest acute infarction. No midline
shift, mass effect, evidence of mass lesion, ventriculomegaly,
extra-axial collection or acute intracranial hemorrhage.
Cervicomedullary junction and pituitary are within normal limits.

Vascular: Major intracranial vascular flow voids appear stable. The
major dural venous sinuses are enhancing and appear to be patent.

Skull and upper cervical spine: Generalized decreased bone marrow
signal throughout the skull and cervical spine was present in [9J],
with no destructive or discrete suspicious bone lesion identified.

Sinuses/Orbits: Stable, negative.

Other: Mastoids are clear. Visible internal auditory structures
appear normal. Visible scalp and face appear negative.
IMPRESSION: 1. Subtle, indeterminate dural thickening and mild nodularity along
both frontal convexities.
Some of these areas appear stable since [9J] suggesting benign
etiology, and there is no associated cerebral edema, distinct
overlying skull metastasis (see #2), or intra-axial brain metastasis
identified.
A short interval repeat Head MRI without and with contrast in 3
months would be helpful to evaluate stability.
But in the meantime, CSF analysis could be considered if there is a
high suspicion of dural metastases.

2. Diffusely decreased T1 bone marrow signal throughout the skull
and visible cervical spine, but no destructive skull lesion.

## 2021-01-15 MED ORDER — CAPECITABINE 500 MG PO TABS: 1500.0000 mg | ORAL_TABLET | Freq: Two times a day (BID) | ORAL | 4 refills | Status: DC

## 2021-01-15 MED ORDER — GADOBUTROL 1 MMOL/ML IV SOLN
8.0000 mL | Freq: Once | INTRAVENOUS | Status: AC | PRN
  Administered 2021-01-15: 8 mL via INTRAVENOUS

## 2021-01-15 NOTE — Progress Notes (Signed)
Patient Name  Mar-Mac, [place] K Legal Sex  Female DOB  February 17, 1981 SSN  [id number] Address  [address] [place] [address] Phone  [phone] [facility])  [phone] (Mobile) *Preferred*    RE: US BIOPSY (LIVER) Received: Today Arne Cleveland, MD  Arlyn Leak   [place] core liver biopsy  Note: innumerable diffuse small lesions   DDH        Previous Messages   ----- Message -----  From: Garth Bigness D  Sent: 01/14/2021   5:55 PM EDT  To: Ir Procedure Requests  Subject: US BIOPSY (LIVER)                               Procedure:   US BIOPSY (LIVER)   Reason:   Malignant neoplasm of upper-outer quadrant of right breast in female, estrogen receptor positive, Malignant neoplasm of overlapping sites of right female breast, unspecified estrogen receptor status, Bone metastases, Liver metastases, confirm mets, evaluate prognostic panel   History:  NM. MR in computer   Provider:  Chauncey Cruel   Provider Contact:  [phone]

## 2021-01-15 NOTE — Telephone Encounter (Signed)
Scheduled appt per 8/17 sch msg. Pt aware.  

## 2021-01-16 ENCOUNTER — Telehealth: Payer: Self-pay

## 2021-01-16 ENCOUNTER — Other Ambulatory Visit: Payer: Self-pay

## 2021-01-16 ENCOUNTER — Inpatient Hospital Stay: Payer: Commercial Managed Care - PPO

## 2021-01-16 ENCOUNTER — Encounter: Payer: Self-pay | Admitting: Oncology

## 2021-01-16 ENCOUNTER — Other Ambulatory Visit (HOSPITAL_COMMUNITY): Payer: Self-pay

## 2021-01-16 DIAGNOSIS — D582 Other hemoglobinopathies: Secondary | ICD-10-CM

## 2021-01-16 DIAGNOSIS — Z5111 Encounter for antineoplastic chemotherapy: Secondary | ICD-10-CM | POA: Diagnosis not present

## 2021-01-16 DIAGNOSIS — Z170 Estrogen receptor positive status [ER+]: Secondary | ICD-10-CM

## 2021-01-16 DIAGNOSIS — C7951 Secondary malignant neoplasm of bone: Secondary | ICD-10-CM

## 2021-01-16 DIAGNOSIS — C787 Secondary malignant neoplasm of liver and intrahepatic bile duct: Secondary | ICD-10-CM

## 2021-01-16 DIAGNOSIS — C50411 Malignant neoplasm of upper-outer quadrant of right female breast: Secondary | ICD-10-CM

## 2021-01-16 MED ORDER — SODIUM CHLORIDE 0.9% IV SOLUTION
250.0000 mL | Freq: Once | INTRAVENOUS | Status: AC
  Administered 2021-01-16: 250 mL via INTRAVENOUS

## 2021-01-16 MED ORDER — DIPHENHYDRAMINE HCL 25 MG PO CAPS
25.0000 mg | ORAL_CAPSULE | Freq: Once | ORAL | Status: AC
  Administered 2021-01-16: 25 mg via ORAL
  Filled 2021-01-16: qty 1

## 2021-01-16 MED ORDER — ACETAMINOPHEN 325 MG PO TABS
650.0000 mg | ORAL_TABLET | Freq: Once | ORAL | Status: AC
  Administered 2021-01-16: 650 mg via ORAL
  Filled 2021-01-16: qty 2

## 2021-01-16 NOTE — Telephone Encounter (Signed)
Oral Oncology Patient Advocate Encounter   Received notification from [place] that prior authorization for Xeloda is required.   PA submitted on CoverMyMeds Key [id number] Status is pending   Oral Oncology Clinic will continue to follow.   [place] Patient [place] Phone [phone] Fax [phone] 01/16/2021 9:52 AM

## 2021-01-16 NOTE — Telephone Encounter (Signed)
Oral Oncology Pharmacist Encounter  Received new prescription for capecitabine (Xeloda) for the treatment of ER+, HER2- stage IV metastatic breast cancer as monotherapy, planned duration until disease progression or unacceptable toxicity.  Labs from 01/14/21 assessed, LFTs are elevated likely due to disease and no dosage adjustments are suggested.  Dose and frequency assessed.  Current medication list in Epic reviewed, DDIs with none identified.  Evaluated chart and no patient barriers to medication adherence noted.   Patient agreement for treatment documented in MD note on 01/14/21.  Prescription has been e-scribed from pharmacy treatment plan. [facility] is assessing for benefits analysis and approval.  Oral Oncology Clinic will continue to follow for insurance authorization, copayment issues, initial counseling and start date.  Drema Halon, PharmD Hematology/Oncology Clinical Pharmacist [facility] [phone]

## 2021-01-16 NOTE — Patient Instructions (Signed)
https://www.[facility].org/donate-blood/blood-donation-process/what-happens-to-donated-blood/blood-transfusions/types-of-blood-transfusions.html"> https://www.[facility].org/donate-blood/blood-donation-process/what-happens-to-donated-blood/blood-transfusions/risks-complications.html">  Blood Transfusion, Adult, Care After This sheet gives you information about how to care for yourself after your procedure. Your health care provider may also give you more specific instructions. If you have problems or questions, contact your health careprovider. What can I expect after the procedure? After the procedure, it is common to have: Bruising and soreness where the IV was inserted. A fever or chills on the day of the procedure. This may be your body's response to the new blood cells received. A headache. Follow these instructions at home: IV insertion site care     Follow instructions from your health care provider about how to take care of your IV insertion site. Make sure you: Wash your hands with soap and water before and after you change your bandage (dressing). If soap and water are not available, use hand sanitizer. Change your dressing as told by your health care provider. Check your IV insertion site every day for signs of infection. Check for: Redness, swelling, or pain. Bleeding from the site. Warmth. Pus or a bad smell. General instructions Take over-the-counter and prescription medicines only as told by your health care provider. Rest as told by your health care provider. Return to your normal activities as told by your health care provider. Keep all follow-up visits as told by your health care provider. This is important. Contact a health care provider if: You have itching or red, swollen areas of skin (hives). You feel anxious. You feel weak after doing your normal activities. You have redness, swelling, warmth, or pain around the IV insertion site. You have blood coming  from the IV insertion site that does not stop with pressure. You have pus or a bad smell coming from your IV insertion site. Get help right away if: You have symptoms of a serious allergic or immune system reaction, including: Trouble breathing or shortness of breath. Swelling of the face or feeling flushed. Fever or chills. Pain in the head, back, or chest. Dark urine or blood in the urine. Widespread rash. Fast heartbeat. Feeling dizzy or light-headed. If you receive your blood transfusion in an outpatient setting, you will betold whom to contact to report any reactions. These symptoms may represent a serious problem that is an emergency. Do not wait to see if the symptoms will go away. Get medical help right away. Call your local emergency services (911 in the [place] Do not drive yourself to the hospital. Summary Bruising and tenderness around the IV insertion site are common. Check your IV insertion site every day for signs of infection. Rest as told by your health care provider. Return to your normal activities as told by your health care provider. Get help right away for symptoms of a serious allergic or immune system reaction to blood transfusion. This information is not intended to replace advice given to you by your health care provider. Make sure you discuss any questions you have with your healthcare provider. Document Revised: 11/09/2018 Document Reviewed: 11/09/2018 Elsevier Patient Education  2022 [employer] Inc[employer]  

## 2021-01-19 ENCOUNTER — Other Ambulatory Visit: Payer: Self-pay | Admitting: *Deleted

## 2021-01-19 ENCOUNTER — Other Ambulatory Visit (HOSPITAL_COMMUNITY): Payer: Self-pay

## 2021-01-19 ENCOUNTER — Telehealth: Payer: Self-pay | Admitting: *Deleted

## 2021-01-19 DIAGNOSIS — C50811 Malignant neoplasm of overlapping sites of right female breast: Secondary | ICD-10-CM

## 2021-01-19 DIAGNOSIS — C787 Secondary malignant neoplasm of liver and intrahepatic bile duct: Secondary | ICD-10-CM

## 2021-01-19 LAB — BPAM RBC
Blood Product Expiration Date: 202209062359
Blood Product Expiration Date: 202209082359
ISSUE DATE / TIME: 202208171623
ISSUE DATE / TIME: 202208191216
Unit Type and Rh: 6200
Unit Type and Rh: 6200

## 2021-01-19 LAB — TYPE AND SCREEN
ABO/RH(D): A POS
Antibody Screen: NEGATIVE
Unit division: 0
Unit division: 0

## 2021-01-19 MED ORDER — ONDANSETRON HCL 8 MG PO TABS
8.0000 mg | ORAL_TABLET | Freq: Three times a day (TID) | ORAL | 1 refills | Status: DC | PRN
Start: 2021-01-19 — End: 2021-02-21

## 2021-01-19 NOTE — Telephone Encounter (Signed)
This RN spoke with pt per her call stating onset over the weekend of increased discomfort in abd with bloating and intermittent nausea.  She states bowel changes from her norm of more elongated stool to now passing " balls ".  She is able to hydrate well but she finds she has very early satiety with food.  She states she had previously been on decadron but would prefer not to resume it-  She has reglan in the home.  Per discussion of above with MD and IR- pt's bx rescheduled to 8/24 with possible paracentesis if needed.  Pt advised of above as well as to use Reglan presently up to 4 times a day to assist with bowel peristalsis.  Zofran refilled to use if needed.  BX of liver rescheduled from 8/29 tp 8/24 with possible paracenteses if needed.  Above reviewed with pt with verbalized understanding.

## 2021-01-19 NOTE — Telephone Encounter (Signed)
Oral Oncology Patient Advocate Encounter  Prior Authorization for Xeloda has been approved.    PA# [id number] Effective dates: 01/16/21 through 01/16/22  Patient must fill at [facility] will continue to follow.   [place] Patient [place] Phone [phone] Fax [phone] 01/19/2021 8:32 AM

## 2021-01-20 ENCOUNTER — Other Ambulatory Visit: Payer: Self-pay | Admitting: Oncology

## 2021-01-20 ENCOUNTER — Other Ambulatory Visit: Payer: Self-pay | Admitting: Radiology

## 2021-01-20 ENCOUNTER — Other Ambulatory Visit: Payer: Self-pay

## 2021-01-20 ENCOUNTER — Ambulatory Visit (HOSPITAL_COMMUNITY)
Admission: RE | Admit: 2021-01-20 | Discharge: 2021-01-20 | Disposition: A | Discharge status: Home / Self Care | Payer: Commercial Managed Care - PPO | Source: Ambulatory Visit | Attending: Oncology | Admitting: Oncology

## 2021-01-20 DIAGNOSIS — C50811 Malignant neoplasm of overlapping sites of right female breast: Secondary | ICD-10-CM | POA: Diagnosis not present

## 2021-01-20 DIAGNOSIS — C787 Secondary malignant neoplasm of liver and intrahepatic bile duct: Secondary | ICD-10-CM | POA: Diagnosis present

## 2021-01-20 IMAGING — US US ABDOMEN LIMITED
1 series · 6 of 6 positions shown · non-contrast
Comparison: None.

CLINICAL DATA: Metastatic breast cancer

Ascites
EXAM:
LIMITED ABDOMEN ULTRASOUND FOR ASCITES
TECHNIQUE: Limited ultrasound survey for ascites was performed in all four
abdominal quadrants.

[Series 1: us abdomen limited · 6 of 6 slices shown]
[im 1/6]
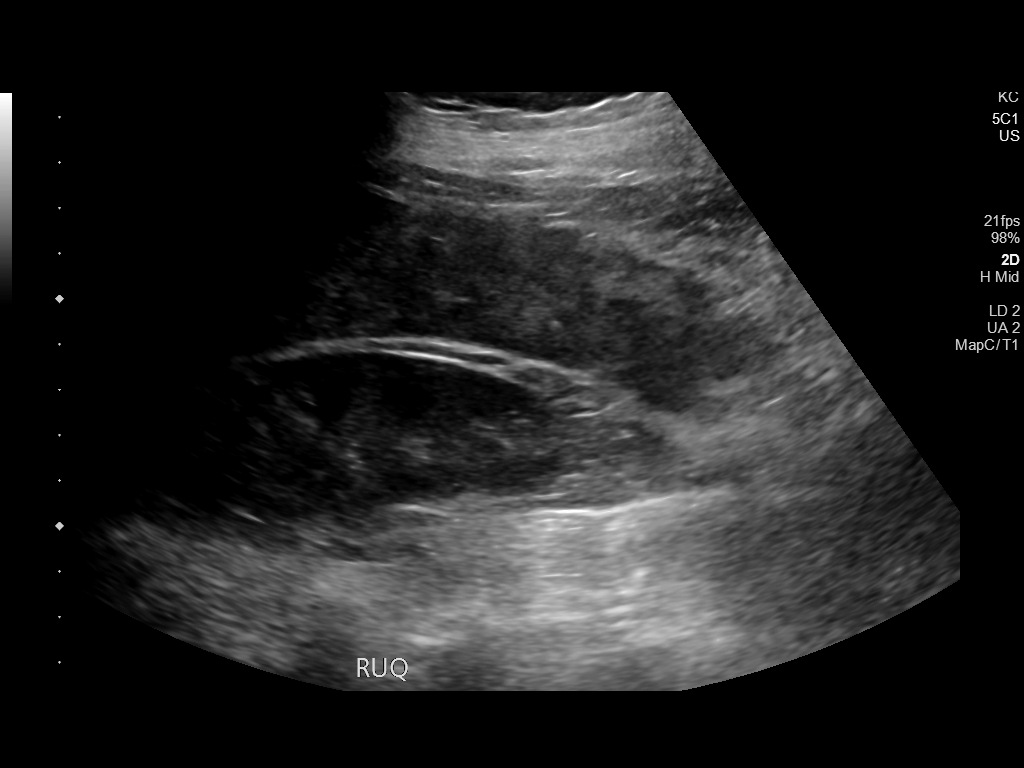
[im 2/6]
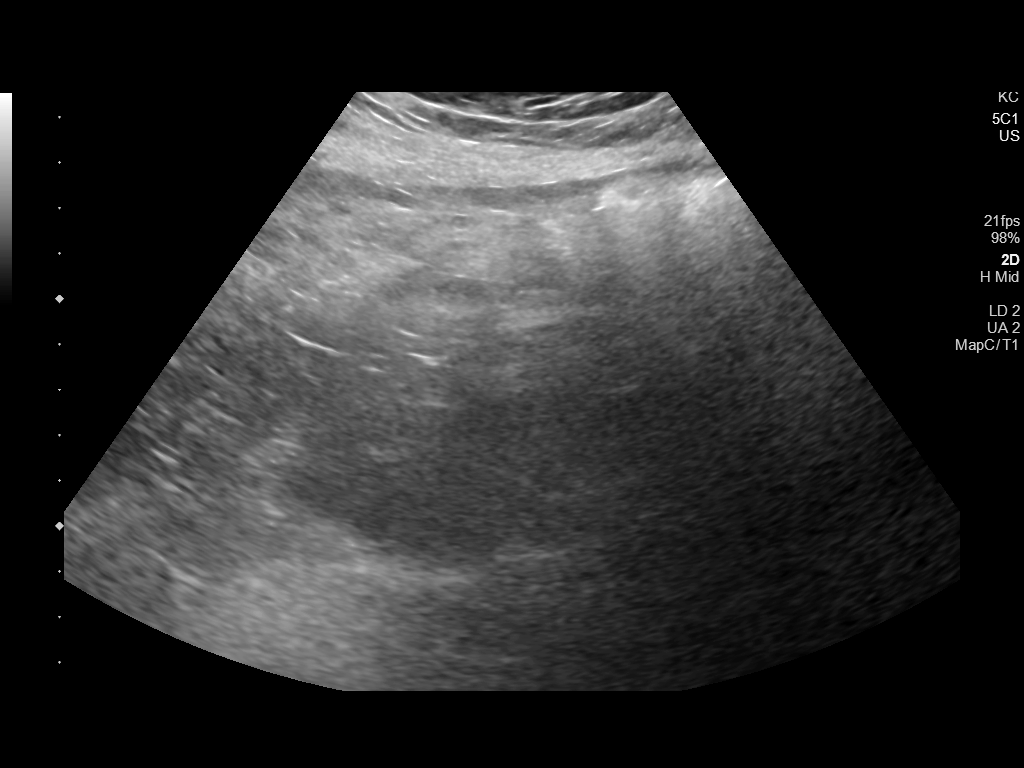
[im 3/6]
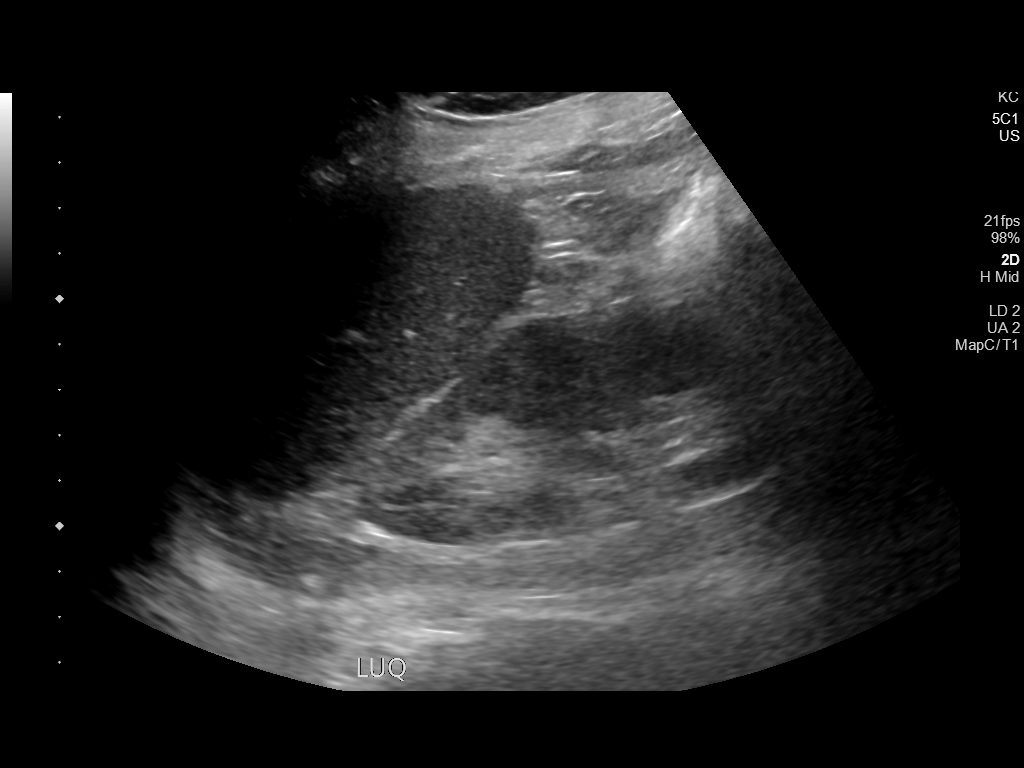
[im 4/6]
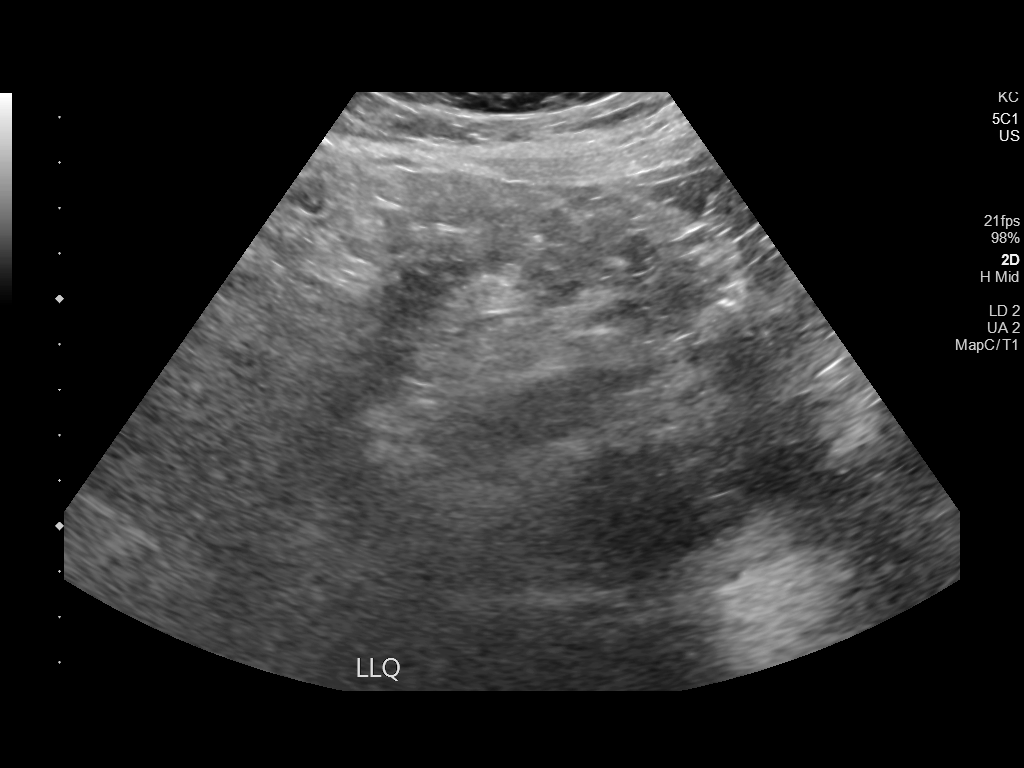
[im 5/6]
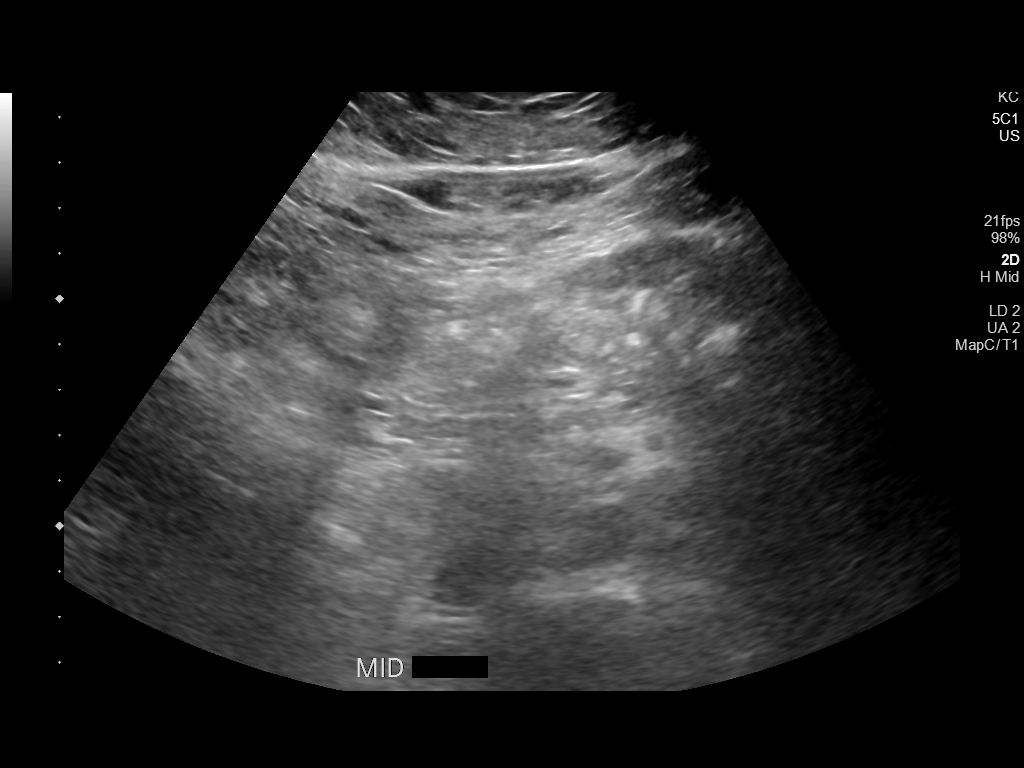
[im 6/6]
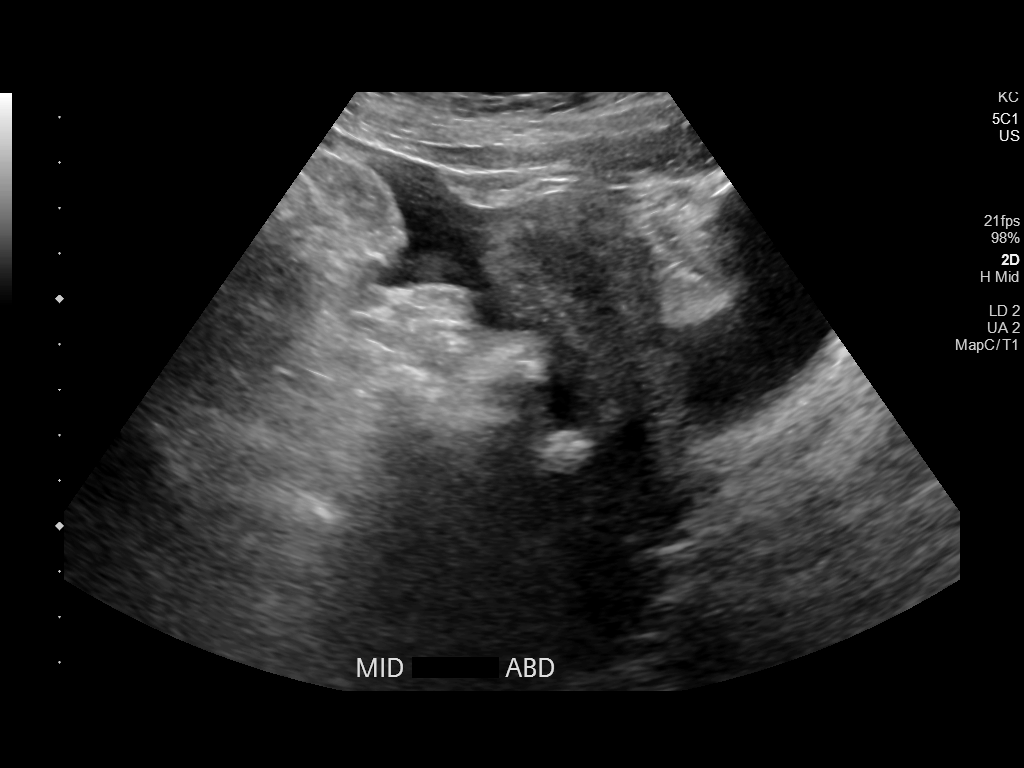

[6 of 6 positions shown; findings below may reference images not displayed]

FINDINGS: Trace mid lower abdominal ascites.
IMPRESSION: Trace mid lower abdominal ascites.

## 2021-01-20 MED ORDER — CAPECITABINE 500 MG PO TABS: 1500.0000 mg | ORAL_TABLET | Freq: Two times a day (BID) | ORAL | 4 refills | Status: DC

## 2021-01-20 MED ORDER — CAPECITABINE 500 MG PO TABS
1500.0000 mg | ORAL_TABLET | Freq: Two times a day (BID) | ORAL | 6 refills | Status: DC
  Filled 2021-01-20: qty 84, 14d supply, fill #0

## 2021-01-20 NOTE — Progress Notes (Signed)
Patient ID: [name] [name], female   DOB: [birth year], 40 y.o.   MRN: [id number] Pt presented to [place] dept today for paracentesis. On limited US abd in all four quadrants there is only trace amount of pelvic fluid noted around uterus. Procedure cancelled. Pt informed. She is scheduled for liver mass bx on 8/24.

## 2021-01-20 NOTE — Progress Notes (Signed)
I called [name] [name] just to make sure she was okay.  She has taken off from work for the last 2 days and is feeling a little bit better, but still very tired and nauseated.  She is now jaundiced.  She tells me that the capecitabine is ready to be mailed.  Possibly she will have it on hand when she returns to see me on the 25th.  She has already stopped the palbociclib and anastrozole.  Her last goserelin was 12/26/2020 and she can get a dose when she sees me the 25th.

## 2021-01-21 ENCOUNTER — Ambulatory Visit (HOSPITAL_COMMUNITY)
Admission: RE | Admit: 2021-01-21 | Discharge: 2021-01-21 | Disposition: A | Discharge status: Home / Self Care | Payer: Commercial Managed Care - PPO | Source: Ambulatory Visit | Attending: Oncology | Admitting: Oncology

## 2021-01-21 ENCOUNTER — Other Ambulatory Visit (HOSPITAL_COMMUNITY): Payer: Self-pay

## 2021-01-21 ENCOUNTER — Inpatient Hospital Stay: Payer: Commercial Managed Care - PPO

## 2021-01-21 ENCOUNTER — Other Ambulatory Visit: Payer: Self-pay

## 2021-01-21 ENCOUNTER — Encounter (HOSPITAL_COMMUNITY): Payer: Self-pay

## 2021-01-21 DIAGNOSIS — C7952 Secondary malignant neoplasm of bone marrow: Secondary | ICD-10-CM

## 2021-01-21 DIAGNOSIS — Z7984 Long term (current) use of oral hypoglycemic drugs: Secondary | ICD-10-CM | POA: Insufficient documentation

## 2021-01-21 DIAGNOSIS — E0500 Thyrotoxicosis with diffuse goiter without thyrotoxic crisis or storm: Secondary | ICD-10-CM

## 2021-01-21 DIAGNOSIS — Z5111 Encounter for antineoplastic chemotherapy: Secondary | ICD-10-CM | POA: Diagnosis not present

## 2021-01-21 DIAGNOSIS — C787 Secondary malignant neoplasm of liver and intrahepatic bile duct: Secondary | ICD-10-CM | POA: Insufficient documentation

## 2021-01-21 DIAGNOSIS — Z7952 Long term (current) use of systemic steroids: Secondary | ICD-10-CM | POA: Insufficient documentation

## 2021-01-21 DIAGNOSIS — Z79890 Hormone replacement therapy (postmenopausal): Secondary | ICD-10-CM | POA: Insufficient documentation

## 2021-01-21 DIAGNOSIS — Z170 Estrogen receptor positive status [ER+]: Secondary | ICD-10-CM

## 2021-01-21 DIAGNOSIS — K769 Liver disease, unspecified: Secondary | ICD-10-CM | POA: Diagnosis present

## 2021-01-21 DIAGNOSIS — D5919 Other autoimmune hemolytic anemia: Secondary | ICD-10-CM

## 2021-01-21 DIAGNOSIS — C50919 Malignant neoplasm of unspecified site of unspecified female breast: Secondary | ICD-10-CM | POA: Diagnosis present

## 2021-01-21 DIAGNOSIS — C50411 Malignant neoplasm of upper-outer quadrant of right female breast: Secondary | ICD-10-CM

## 2021-01-21 DIAGNOSIS — C7951 Secondary malignant neoplasm of bone: Secondary | ICD-10-CM

## 2021-01-21 DIAGNOSIS — E890 Postprocedural hypothyroidism: Secondary | ICD-10-CM | POA: Diagnosis not present

## 2021-01-21 DIAGNOSIS — Z79899 Other long term (current) drug therapy: Secondary | ICD-10-CM | POA: Diagnosis not present

## 2021-01-21 DIAGNOSIS — C50811 Malignant neoplasm of overlapping sites of right female breast: Secondary | ICD-10-CM

## 2021-01-21 LAB — COMPREHENSIVE METABOLIC PANEL
ALT: 133 U/L — ABNORMAL HIGH (ref 0–44)
ALT: 122 U/L — ABNORMAL HIGH (ref 0–44)
AST: 181 U/L (ref 15–41)
AST: 179 U/L — ABNORMAL HIGH (ref 15–41)
Albumin: 2.6 g/dL — ABNORMAL LOW (ref 3.5–5.0)
Albumin: 2.8 g/dL — ABNORMAL LOW (ref 3.5–5.0)
Alkaline Phosphatase: 329 U/L — ABNORMAL HIGH (ref 38–126)
Alkaline Phosphatase: 287 U/L — ABNORMAL HIGH (ref 38–126)
Anion gap: 9 (ref 5–15)
Anion gap: 10 (ref 5–15)
BUN: 12 mg/dL (ref 6–20)
BUN: 12 mg/dL (ref 6–20)
CO2: 22 mmol/L (ref 22–32)
CO2: 21 mmol/L — ABNORMAL LOW (ref 22–32)
Calcium: 8.5 mg/dL — ABNORMAL LOW (ref 8.9–10.3)
Calcium: 8.3 mg/dL — ABNORMAL LOW (ref 8.9–10.3)
Chloride: 108 mmol/L (ref 98–111)
Chloride: 108 mmol/L (ref 98–111)
Creatinine, Ser: 0.8 mg/dL (ref 0.44–1.00)
Creatinine, Ser: 0.76 mg/dL (ref 0.44–1.00)
GFR, Estimated: >60 mL/min (ref >60)
GFR, Estimated: >60 mL/min (ref >60)
Glucose, Bld: 122 mg/dL — ABNORMAL HIGH (ref 70–99)
Glucose, Bld: 122 mg/dL — ABNORMAL HIGH (ref 70–99)
Potassium: 4.2 mmol/L (ref 3.5–5.1)
Potassium: 4.2 mmol/L (ref 3.5–5.1)
Sodium: 139 mmol/L (ref 135–145)
Sodium: 139 mmol/L (ref 135–145)
Total Bilirubin: 6.9 mg/dL (ref 0.3–1.2)
Total Bilirubin: 6.6 mg/dL — ABNORMAL HIGH (ref 0.3–1.2)
Total Protein: 6.8 g/dL (ref 6.5–8.1)
Total Protein: 6.7 g/dL (ref 6.5–8.1)

## 2021-01-21 LAB — CBC WITH DIFFERENTIAL/PLATELET
Abs Immature Granulocytes: 0.24 10*3/uL — ABNORMAL HIGH (ref 0.00–0.07)
Abs Immature Granulocytes: 0 10*3/uL (ref 0.00–0.07)
Basophils Absolute: 0.1 10*3/uL (ref 0.0–0.1)
Basophils Absolute: 0.1 10*3/uL (ref 0.0–0.1)
Basophils Relative: 1 %
Basophils Relative: 1 %
Eosinophils Absolute: 0.1 10*3/uL (ref 0.0–0.5)
Eosinophils Absolute: 0.2 10*3/uL (ref 0.0–0.5)
Eosinophils Relative: 1 %
Eosinophils Relative: 2 %
HCT: 35.2 % — ABNORMAL LOW (ref 36.0–46.0)
HCT: 37.6 % (ref 36.0–46.0)
Hemoglobin: 11.5 g/dL — ABNORMAL LOW (ref 12.0–15.0)
Hemoglobin: 11.8 g/dL — ABNORMAL LOW (ref 12.0–15.0)
Immature Granulocytes: 3 %
Lymphocytes Relative: 32 %
Lymphocytes Relative: 32 %
Lymphs Abs: 3 10*3/uL (ref 0.7–4.0)
Lymphs Abs: 2.9 10*3/uL (ref 0.7–4.0)
MCH: 28.6 pg (ref 26.0–34.0)
MCH: 28.4 pg (ref 26.0–34.0)
MCHC: 32.7 g/dL (ref 30.0–36.0)
MCHC: 31.4 g/dL (ref 30.0–36.0)
MCV: 87.6 fL (ref 80.0–100.0)
MCV: 90.6 fL (ref 80.0–100.0)
Monocytes Absolute: 1.2 10*3/uL — ABNORMAL HIGH (ref 0.1–1.0)
Monocytes Absolute: 0.6 10*3/uL (ref 0.1–1.0)
Monocytes Relative: 13 %
Monocytes Relative: 6 %
Neutro Abs: 4.9 10*3/uL (ref 1.7–7.7)
Neutro Abs: 5.4 10*3/uL (ref 1.7–7.7)
Neutrophils Relative %: 50 %
Neutrophils Relative %: 59 %
Platelets: 95 10*3/uL — ABNORMAL LOW (ref 150–400)
Platelets: 97 10*3/uL — ABNORMAL LOW (ref 150–400)
RBC: 4.02 MIL/uL (ref 3.87–5.11)
RBC: 4.15 MIL/uL (ref 3.87–5.11)
RDW: 20.7 % — ABNORMAL HIGH (ref 11.5–15.5)
RDW: 21.5 % — ABNORMAL HIGH (ref 11.5–15.5)
WBC: 9.5 10*3/uL (ref 4.0–10.5)
WBC: 9.2 10*3/uL (ref 4.0–10.5)
nRBC: 25.1 % — ABNORMAL HIGH (ref 0.0–0.2)
nRBC: 29.7 % — ABNORMAL HIGH (ref 0.0–0.2)

## 2021-01-21 LAB — GLUCOSE, CAPILLARY: Glucose-Capillary: 124 mg/dL — ABNORMAL HIGH (ref 70–99)

## 2021-01-21 LAB — SAMPLE TO BLOOD BANK

## 2021-01-21 LAB — PROTIME-INR
INR: 1.1 (ref 0.8–1.2)
Prothrombin Time: 14 seconds (ref 11.4–15.2)

## 2021-01-21 IMAGING — US US BIOPSY CORE LIVER
1 series · 14 of 25 positions shown · non-contrast
Comparison: none

INDICATION: 39-year-old with breast cancer and evidence for innumerable hepatic
lesions. Tissue diagnosis is needed.

[Series 1: us core biopsy (liver) mc & wl · 14 of 29 slices shown]
[im 1/29]
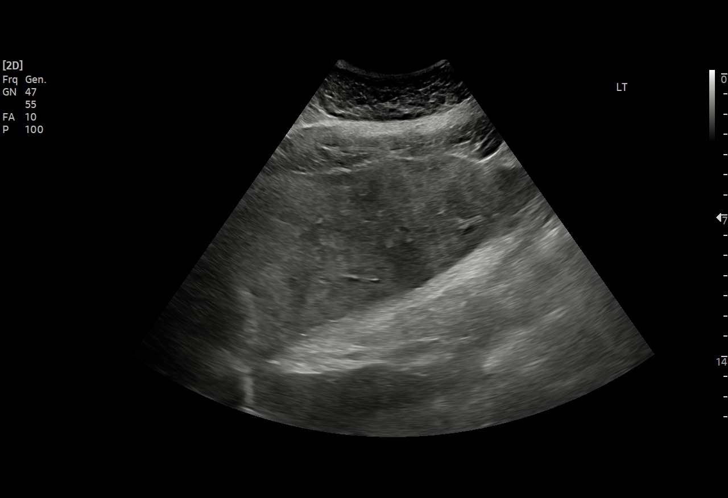
[im 3/29]
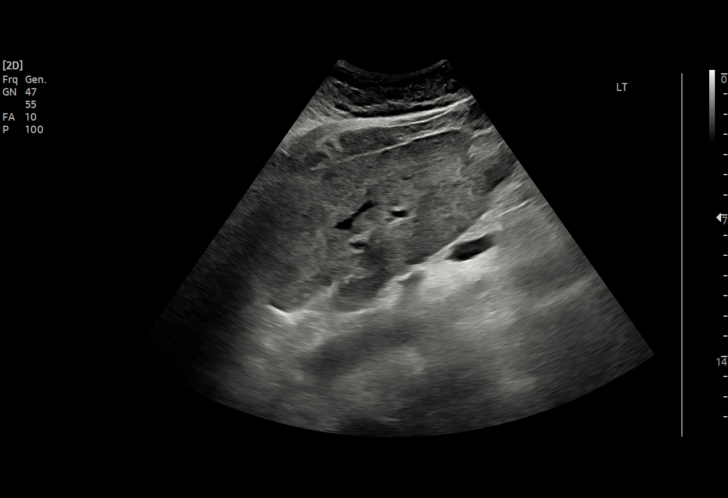
[im 5/29]
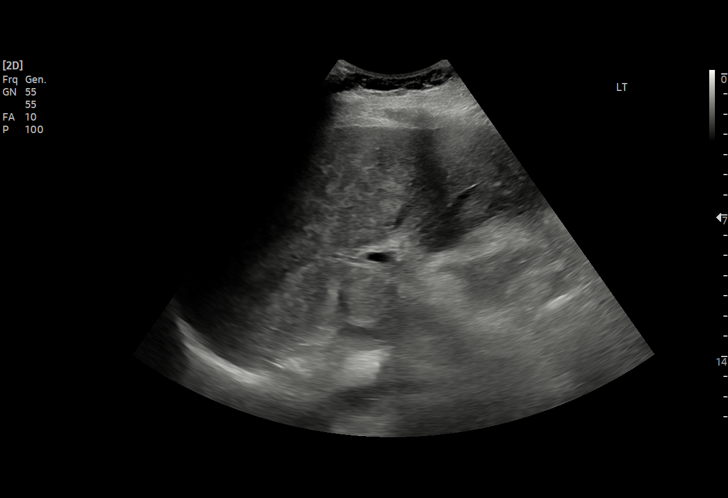
[im 8/29]
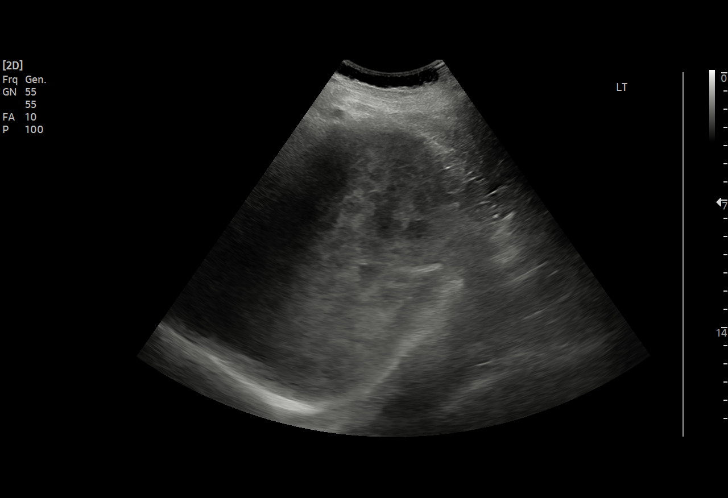
[im 10/29]
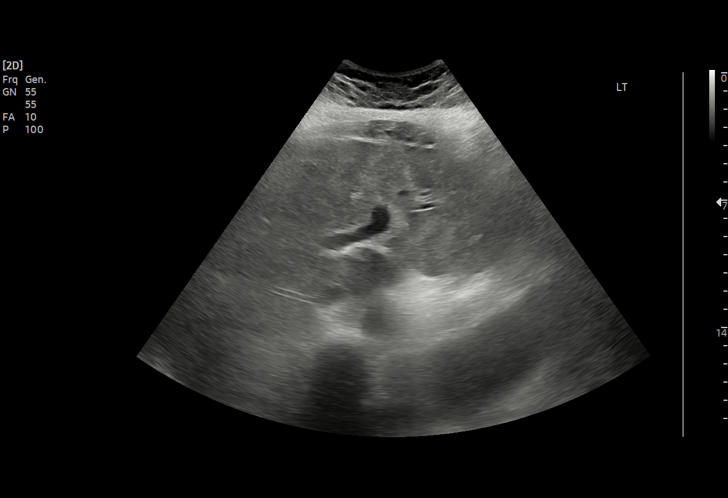
[im 11/29]
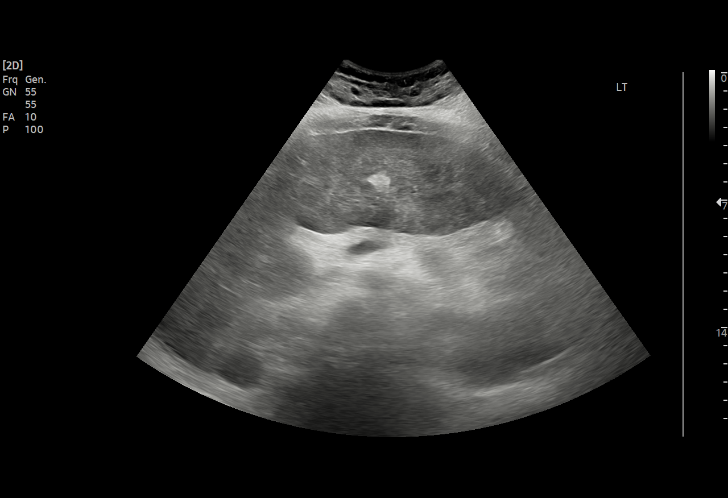
[im 13/29]
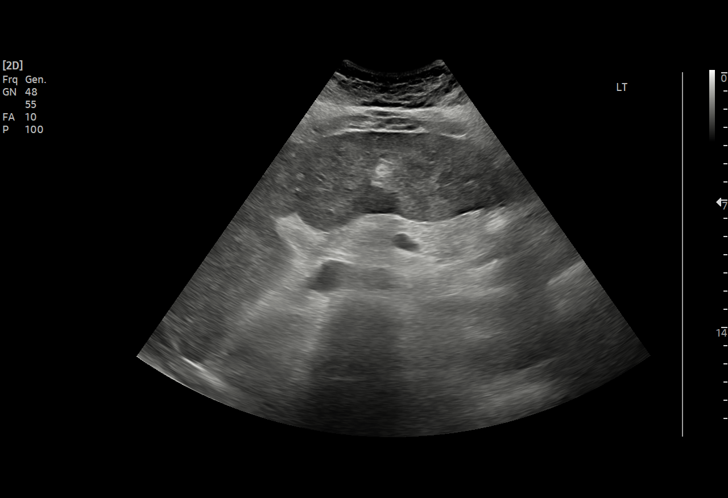
[im 16/29]
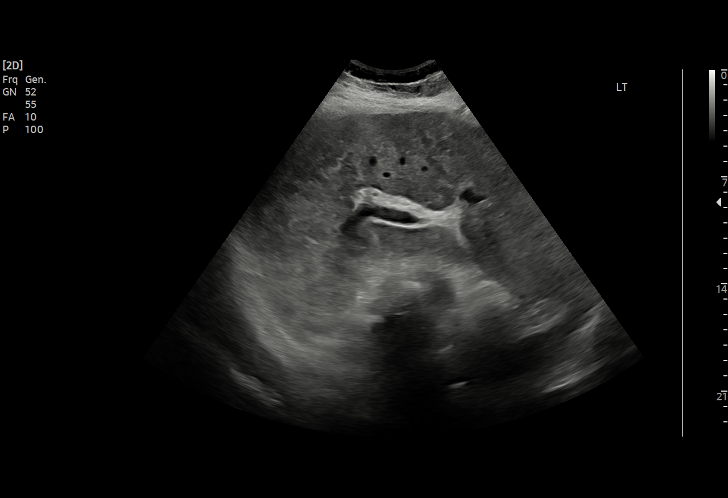
[im 18/29]
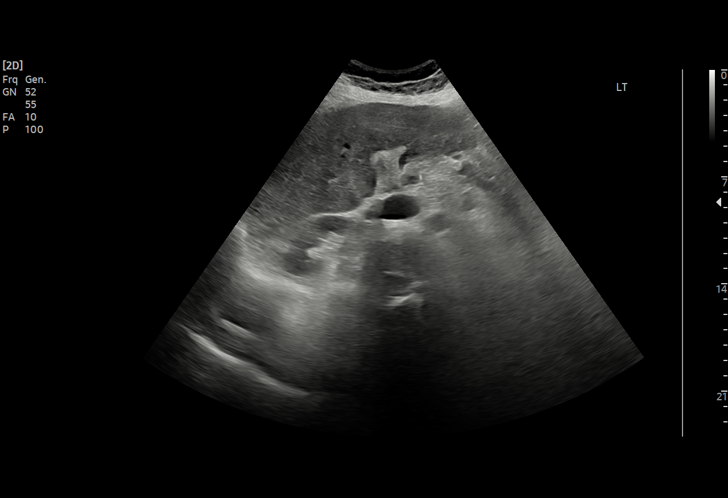
[im 19/29]
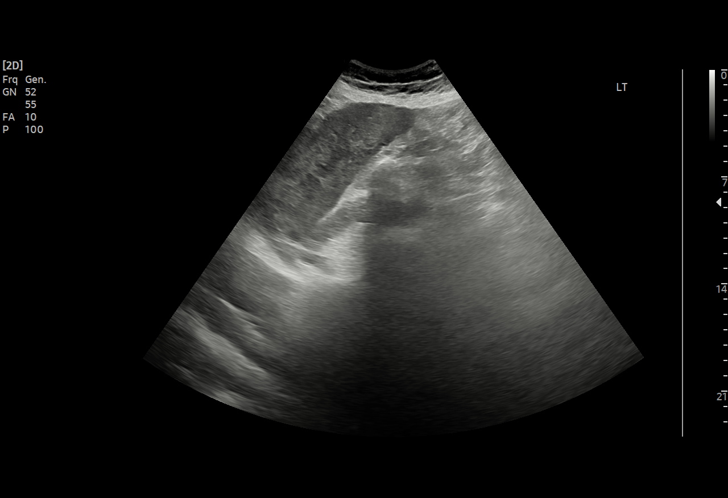
[im 22/29]
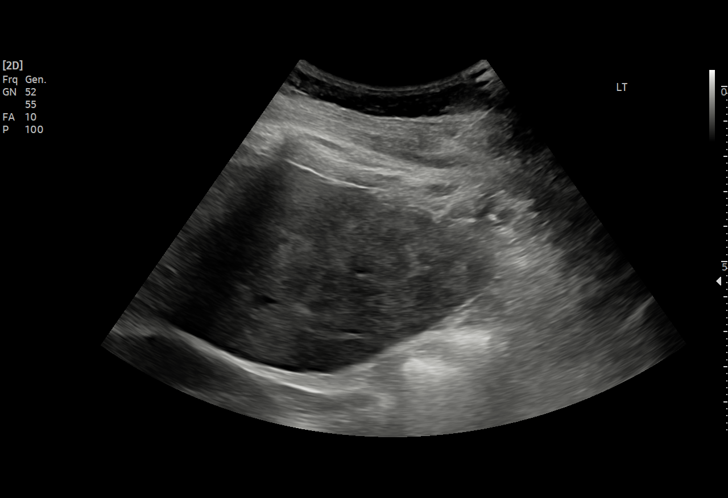
[im 24/29]
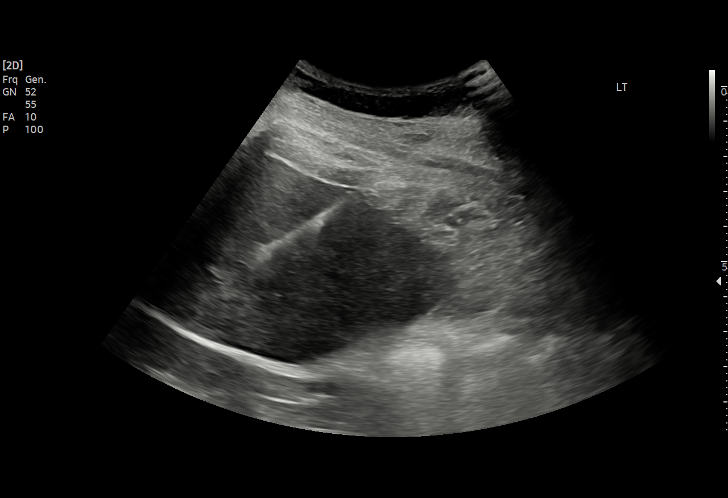
[im 26/29]
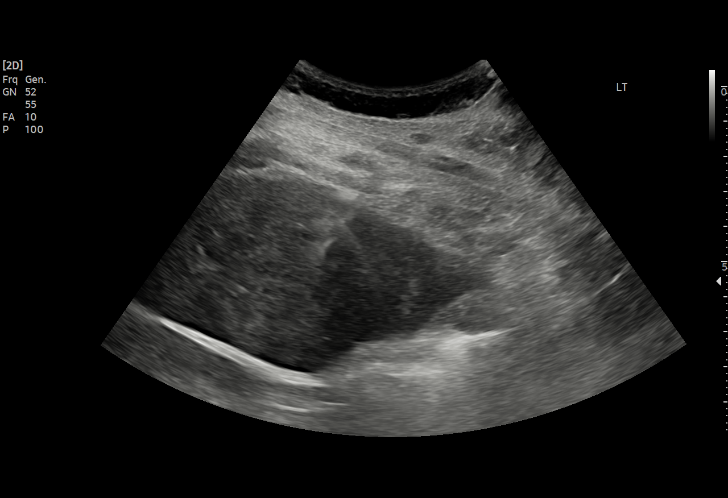
[im 29/29]
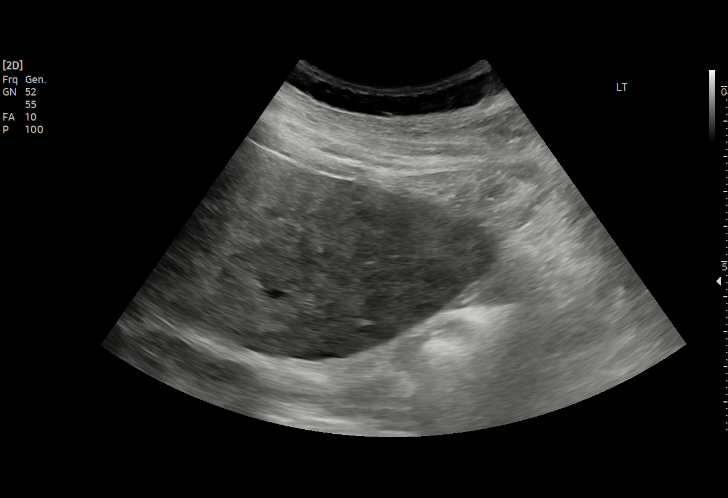

[14 of 25 positions shown; findings below may reference images not displayed]

EXAM:
Ultrasound-guided liver biopsy

MEDICATIONS:
Moderate sedation

ANESTHESIA/SEDATION:
Moderate (conscious) sedation was employed during this procedure. A
total of Versed 2.0 mg and Fentanyl 100 mcg was administered
intravenously.

Moderate Sedation Time: 15 minutes. The patient's level of
consciousness and vital signs were monitored continuously by
radiology nursing throughout the procedure under my direct
supervision.

FLUOROSCOPY TIME:  Fluoroscopy Time: None

COMPLICATIONS:
None immediate.

PROCEDURE:
Informed written consent was obtained from the patient after a
thorough discussion of the procedural risks, benefits and
alternatives. All questions were addressed. A timeout was performed
prior to the initiation of the procedure.

Liver was thoroughly evaluated with ultrasound. The liver is
diffusely heterogeneous without discrete lesions. Based on the
previous MRI, findings are compatible with infiltrative disease in
the liver. As a result, the inferior right hepatic lobe was selected
due to easy accessibility and heterogeneity in this area. The right
side of the abdomen was prepped with chlorhexidine and sterile field
was created. Maximal barrier sterile technique was utilized
including caps, mask, sterile gowns, sterile gloves, sterile drape,
hand hygiene and skin antiseptic. Skin was anesthetized using 1%
lidocaine. Small incision was made. Using ultrasound guidance, 17
gauge coaxial needle was directed into the right hepatic lobe. Total
of 3 core biopsies were obtained with an 18 gauge core device.
Specimens placed in formalin. Gel-Foam slurry was injected through
the 17 gauge needle as it was removed. Bandage placed over the
puncture site.
FINDINGS: Liver is diffusely heterogeneous without discrete lesions. As a
result, heterogeneous area in the right hepatic lobe was targeted
for biopsy. Three adequate specimens obtained. No immediate bleeding
or hematoma formation.
IMPRESSION: Ultrasound-guided core biopsies from the right hepatic lobe.

## 2021-01-21 MED ORDER — LIDOCAINE HCL (PF) 1 % IJ SOLN
INTRAMUSCULAR | Status: AC | PRN
  Administered 2021-01-21: 10 mL via INTRADERMAL

## 2021-01-21 MED ORDER — FENTANYL CITRATE (PF) 100 MCG/2ML IJ SOLN
INTRAMUSCULAR | Status: AC
  Filled 2021-01-21: qty 4

## 2021-01-21 MED ORDER — MIDAZOLAM HCL 2 MG/2ML IJ SOLN
INTRAMUSCULAR | Status: AC
  Filled 2021-01-21: qty 4

## 2021-01-21 MED ORDER — GELATIN ABSORBABLE 12-7 MM EX MISC
CUTANEOUS | Status: AC | PRN
  Administered 2021-01-21: 1 via TOPICAL

## 2021-01-21 MED ORDER — SODIUM CHLORIDE 0.9 % IV SOLN: INTRAVENOUS | Status: DC

## 2021-01-21 MED ORDER — FENTANYL CITRATE (PF) 100 MCG/2ML IJ SOLN
INTRAMUSCULAR | Status: AC | PRN
  Administered 2021-01-21: 50 ug via INTRAVENOUS

## 2021-01-21 MED ORDER — LIDOCAINE HCL 1 % IJ SOLN
INTRAMUSCULAR | Status: AC
  Filled 2021-01-21: qty 20

## 2021-01-21 MED ORDER — GELATIN ABSORBABLE 12-7 MM EX MISC
CUTANEOUS | Status: AC
  Filled 2021-01-21: qty 1

## 2021-01-21 MED ORDER — MIDAZOLAM HCL 2 MG/2ML IJ SOLN
INTRAMUSCULAR | Status: AC | PRN
  Administered 2021-01-21 (×2): 1 mg via INTRAVENOUS

## 2021-01-21 MED ORDER — OXYCODONE HCL 5 MG PO TABS
5.0000 mg | ORAL_TABLET | ORAL | Status: DC | PRN
  Filled 2021-01-21: qty 1

## 2021-01-21 NOTE — Discharge Instructions (Signed)
Please call Interventional Radiology clinic [phone] with any questions or concerns.   You may remove your dressing and shower tomorrow.   Liver Biopsy, Care After After a liver biopsy, it is common to have these things in the area where the biopsy was done. You may: Have pain. Feel sore. Have bruising. You may also feel tired for a few days. Follow these instructions at home: Medicines Take over-the-counter and prescription medicines only as told by your doctor. If you were prescribed an antibiotic medicine, take it as told by your doctor. Do not stop taking the antibiotic, even if you start to feel better. Do not take medicines that may thin your blood. These medicines include aspirin and ibuprofen. Take them only if your doctor tells you to. If told, take steps to prevent problems with pooping (constipation). You may need to: Drink enough fluid to keep your pee (urine) pale yellow. Take medicines. You will be told what medicines to take. Eat foods that are high in fiber. These include beans, whole grains, and fresh fruits and vegetables. Limit foods that are high in fat and sugar. These include fried or sweet foods. Ask your doctor if you should avoid driving or using machines while you are taking your medicine. Caring for your incision Follow instructions from your doctor about how to take care of your cut from surgery (incisions). Make sure you: Wash your hands with soap and water for at least 20 seconds before and after you change your bandage. If you cannot use soap and water, use hand sanitizer. Change your bandage. Leavestitches or skin glue in place for at least two weeks. Leave tape strips alone unless you are told to take them off. You may trim the edges of the tape strips if they curl up. Check your incision every day for signs of infection. Check for: Redness, swelling, or more pain. Fluid or blood. Warmth. Pus or a bad smell. Do not take baths, swim, or use a  hot tub. Ask your doctor about taking showers or sponge baths. Activity Rest at home for 1-2 days, or as told by your doctor. Get up to take short walks every 1 to 2 hours. Ask for help if you feel weak or unsteady. Do not lift anything that is heavier than 10 lb (4.5 kg), or the limit that you are told. Do not play contact sports for 2 weeks after the procedure. Return to your normal activities as told by your doctor. Ask what activities are safe for you. General instructions  Do not drink alcohol in the first week after the procedure. Plan to have a responsible adult care for you for the time you are told after you leave the hospital or clinic. This is important. It is up to you to get the results of your procedure. Ask how to get your results when they are ready. Keep all follow-up visits.   Contact a doctor if: You have more bleeding in your incision. Your incision swells, or is red and more painful. You have fluid that comes from your incision. You develop a rash. You have fever or chills. Get help right away if: You have swelling, bloating, or pain in your belly (abdomen). You get dizzy or faint. You vomit or you feel like vomiting. You have trouble breathing or feel short of breath. You have chest pain. You have problems talking or seeing. You have trouble with your balance or moving your arms or legs. These symptoms may be an emergency. Get help   right away. Call your local emergency services (911 in the [place] Do not wait to see if the symptoms will go away. Do not drive yourself to the hospital. Summary After the procedure, it is common to have pain, soreness, bruising, and tiredness. Your doctor will tell you how to take care of yourself at home. Change your bandage, take your medicines, and limit your activities as told by your doctor. Call your doctor if you have symptoms of infection. Get help right away if your belly swells, your cut bleeds a lot, or you have trouble  talking or breathing. This information is not intended to replace advice given to you by your health care provider. Make sure you discuss any questions you have with your healthcare provider. Document Revised: 03/31/2020 Document Reviewed: 03/31/2020 Elsevier Patient Education  2022 Elsevier Inc.     Moderate Conscious Sedation, Adult, Care After This sheet gives you information about how to care for yourself after your procedure. Your health care provider may also give you more specific instructions. If you have problems or questions, contact your health care provider. What can I expect after the procedure? After the procedure, it is common to have: Sleepiness for several hours. Impaired judgment for several hours. Difficulty with balance. Vomiting if you eat too soon. Follow these instructions at home: For the time period you were told by your health care provider: Rest. Do not participate in activities where you could fall or become injured. Do not drive or use machinery. Do not drink alcohol. Do not take sleeping pills or medicines that cause drowsiness. Do not make important decisions or sign legal documents. Do not take care of children on your own.        Eating and drinking Follow the diet recommended by your health care provider. Drink enough fluid to keep your urine pale yellow. If you vomit: Drink water, juice, or soup when you can drink without vomiting. Make sure you have little or no nausea before eating solid foods.    General instructions Take over-the-counter and prescription medicines only as told by your health care provider. Have a responsible adult stay with you for the time you are told. It is important to have someone help care for you until you are awake and alert. Do not smoke. Keep all follow-up visits as told by your health care provider. This is important. Contact a health care provider if: You are still sleepy or having trouble with balance after  24 hours. You feel light-headed. You keep feeling nauseous or you keep vomiting. You develop a rash. You have a fever. You have redness or swelling around the IV site. Get help right away if: You have trouble breathing. You have new-onset confusion at home. Summary After the procedure, it is common to feel sleepy, have impaired judgment, or feel nauseous if you eat too soon. Rest after you get home. Know the things you should not do after the procedure. Follow the diet recommended by your health care provider and drink enough fluid to keep your urine pale yellow. Get help right away if you have trouble breathing or new-onset confusion at home. This information is not intended to replace advice given to you by your health care provider. Make sure you discuss any questions you have with your health care provider. Document Revised: 09/14/2019 Document Reviewed: 04/12/2019 Elsevier Patient Education  2021 Elsevier Inc.  

## 2021-01-21 NOTE — Consult Note (Addendum)
Chief Complaint: Patient was seen in consultation today for image guided liver mass biopsy  Referring Physician(s): Chauncey Cruel  Supervising Physician: Markus Daft  Patient Status: [facility] - Out-pt  History of Present Illness: [name] K Stansbury is a 40 y.o. female with past medical history of anemia, Graves' disease with prior thyroidectomy and postsurgical hypothyroidism, prediabetes, and stage IV right breast cancer, initially diagnosed in 2020.  Recent imaging has revealed innumerable small enhancing liver masses concerning for metastatic disease with patchy enhancing lesions throughout the visualized skeleton compatible with bone metastasis.  She presents today for image guided liver mass biopsy for further evaluation/prognostic panel.  Past Medical History:  Diagnosis Date   Anemia    History of cardiac murmur as a child    History of Graves' disease    dx hyperthyroidism during 4th pregnancy;   11-15-2013  s/p  total thyroidectomy   Hypothyroidism, postsurgical 11/15/2013   endocrinologist--- dr balan   Pre-diabetes    Stage IV breast cancer in female [facility]) 05/2019   oncologist-- dr c. Hinton Rao ([facility]) bone marrow bx 05-08-2019 and right breast bx 05-17-2020--- primary breast w/ mets to bones, pt taking oral chemo (ibrance)    Past Surgical History:  Procedure Laterality Date   IUD REMOVAL N/A 11/22/2019   Procedure: INTRAUTERINE DEVICE (IUD) REMOVAL;  Surgeon: Servando Salina, MD;  Location: [place];  Service: Gynecology;  Laterality: N/A;   LAPAROSCOPIC TUBAL LIGATION Bilateral 11/22/2019   Procedure: LAPAROSCOPIC TUBAL LIGATION By Cautery;  Surgeon: Servando Salina, MD;  Location: [place];  Service: Gynecology;  Laterality: Bilateral;   THYROIDECTOMY Bilateral 11/15/2013   Procedure: TOTAL THYROIDECTOMY;  Surgeon: Earnstine Regal, MD;  Location: [facility];  Service: General;  Laterality: Bilateral;   WISDOM TOOTH  EXTRACTION  2012    Allergies: Patient has no known allergies.  Medications: Prior to Admission medications   Medication Sig Start Date End Date Taking? Authorizing Provider  b complex vitamins capsule Take 1 capsule by mouth daily.   Yes [provider]  Calcium Carb-Cholecalciferol (CALCIUM 600 + D PO) Take 4 tablets by mouth daily.   Yes [provider]  capecitabine (XELODA) 500 MG tablet Take 3 tablets (1,500 mg total) by mouth 2 (two) times daily after a meal. Take on days 1-14. Repeat every 21 days. 01/20/21  Yes Magrinat, Virgie Dad, MD  Cholecalciferol (VITAMIN D3) 25 MCG (1000 UT) CAPS Take 3 capsules by mouth daily.   Yes [provider]  Denosumab (XGEVA [place]) Inject into the skin every 30 (thirty) days.   Yes [provider]  dexamethasone (DECADRON) 4 MG tablet Take 1 tablet (4 mg total) by mouth 2 (two) times daily with a meal. 01/05/21  Yes Magrinat, Virgie Dad, MD  Goserelin Acetate (ZOLADEX [place]) Inject into the skin every 30 (thirty) days.   Yes [provider]  levothyroxine (SYNTHROID) 112 MCG tablet 1 tablet in the morning on an empty stomach 01/02/21  Yes [provider]  metFORMIN (GLUCOPHAGE) 500 MG tablet Take 1 tablet (500 mg total) by mouth 2 (two) times daily with a meal. 06/05/20  Yes Magrinat, Virgie Dad, MD  ondansetron (ZOFRAN) 8 MG tablet Take 1 tablet (8 mg total) by mouth 3 (three) times daily as needed for nausea or vomiting. 01/19/21  Yes Magrinat, Virgie Dad, MD  ibuprofen (ADVIL) 800 MG tablet Take 1 tablet (800 mg total) by mouth every 8 (eight) hours as needed for moderate pain. Patient  not taking: Reported on 01/08/2021 11/22/19   Servando Salina, MD  valACYclovir (VALTREX) 500 MG tablet Take 1 tablet (500 mg total) by mouth daily. Patient not taking: Reported on 01/08/2021 05/06/20   Magrinat, Virgie Dad, MD     Family History  Problem Relation Age of Onset   Cancer Mother        Peripheral T cell Lymphoma    Diabetes Father    Diabetes Maternal Grandmother     Social History   Socioeconomic History   Marital status: Divorced    Spouse name: Not on file   Number of children: Not on file   Years of education: Not on file   Highest education level: Not on file  Occupational History   Not on file  Tobacco Use   Smoking status: Never   Smokeless tobacco: Never  Vaping Use   Vaping Use: Never used  Substance and Sexual Activity   Alcohol use: Not Currently    Comment: social   Drug use: Never   Sexual activity: Yes    Birth control/protection: I.U.D.  Other Topics Concern   Not on file  Social History Narrative   Not on file   Social Determinants of Health   Financial Resource Strain: Low Risk    Difficulty of Paying Living Expenses: Not hard at all  Food Insecurity: No Food Insecurity   Worried About Charity fundraiser in the Last Year: Never true   [place] in the Last Year: Never true  Transportation Needs: No Transportation Needs   Lack of Transportation (Medical): No   Lack of Transportation (Non-Medical): No  Physical Activity: Not on file  Stress: Not on file  Social Connections: Not on file      Review of Systems currently denies fever, headache, chest pain, dyspnea, cough, abdominal pain, vomiting or bleeding.  She does have some occasional nausea.  Vital Signs: BP (!) 109/58   Pulse 93   Temp 98.2 F (36.8 C) (Oral)   Resp 18   LMP 10/26/2018   SpO2 98%   Physical Exam awake, alert.  Scleral icterus noted.  Chest clear to auscultation bilaterally.  Heart with regular rate and rhythm.  Abdomen soft, positive bowel sounds, nontender.  No lower extremity edema.  Imaging: MR Brain W Wo Contrast  Result Date: 01/19/2021 CLINICAL DATA:  40 year old female with metastatic breast cancer. Staging. EXAM: MRI HEAD WITHOUT AND WITH CONTRAST TECHNIQUE: Multiplanar, multiecho pulse sequences of the brain and surrounding structures were obtained without and  with intravenous contrast. CONTRAST:  49m GADAVIST GADOBUTROL 1 MMOL/ML IV SOLN [facility] FINDINGS: Brain: There is a mildly undulating appearance of the enhancing packing meninges along the frontal convexities, more so on the right (series 16, image 88). And 1 of these rounded areas of dural thickening (that seen on series 16, image 101) is stable since 2020 and appears benign. However, a larger 10 mm area along the right inferior frontal gyrus on series 16, image 81 is new but only 3 mm thick. This lesion is visible on the coronal postcontrast series 19, image 6 (the more inferior and lateral) where similar superior dural nodularity appears fairly stable since the 2020 postcontrast coronal. Furthermore, contralateral left frontal convexity mild dural thickening and nodularity (series 16, image 99) also appears similar 2020. The bilateral dural changes are also visible on axial FLAIR (series 11, images 32 and 35). No abnormal intra-axial enhancement (vascular related enhancement  suspected in the left basal ganglia on series 16, image 74). No cerebral edema. No restricted diffusion to suggest acute infarction. No midline shift, mass effect, ventriculomegaly, extra-axial collection or acute intracranial hemorrhage. Cervicomedullary junction and pituitary are within normal limits. Scattered small mostly subcortical white matter T2 and FLAIR hyperintensity in both hemispheres has mildly increased since 2020, now mild to moderate for age. This is in a nonspecific configuration. No chronic cerebral blood products. No cortical encephalomalacia. No restricted diffusion to suggest acute infarction. No midline shift, mass effect, evidence of mass lesion, ventriculomegaly, extra-axial collection or acute intracranial hemorrhage. Cervicomedullary junction and pituitary are within normal limits. Vascular: Major intracranial vascular flow voids appear stable. The major dural  venous sinuses are enhancing and appear to be patent. Skull and upper cervical spine: Generalized decreased bone marrow signal throughout the skull and cervical spine was present in 2020, with no destructive or discrete suspicious bone lesion identified. Sinuses/Orbits: Stable, negative. Other: Mastoids are clear. Visible internal auditory structures appear normal. Visible scalp and face appear negative. IMPRESSION: 1. Subtle, indeterminate dural thickening and mild nodularity along both frontal convexities. Some of these areas appear stable since 2020 suggesting benign etiology, and there is no associated cerebral edema, distinct overlying skull metastasis (see #2), or intra-axial brain metastasis identified. A short interval repeat Head MRI without and with contrast in 3 months would be helpful to evaluate stability. But in the meantime, CSF analysis could be considered if there is a high suspicion of dural metastases. 2. Diffusely decreased T1 bone marrow signal throughout the skull and visible cervical spine, but no destructive skull lesion. Electronically Signed   By: Genevie Ann M.D.   On: 01/19/2021 06:24   MR LIVER W WO CONTRAST  Result Date: 01/13/2021 CLINICAL DATA:  Metastatic breast cancer. Evaluate possible liver metastasis. EXAM: MRI ABDOMEN WITHOUT AND WITH CONTRAST TECHNIQUE: Multiplanar multisequence MR imaging of the abdomen was performed both before and after the administration of intravenous contrast. CONTRAST:  79m GADAVIST GADOBUTROL 1 MMOL/ML IV SOLN COMPARISON:  02/13/2020 CT chest, abdomen and pelvis. FINDINGS: Lower chest: No acute abnormality at the lung bases. Hepatobiliary: Mild hepatomegaly. No hepatic steatosis. Innumerable indistinct small enhancing mildly T2 hyperintense liver masses throughout the liver replacing much of the liver parenchyma compatible with metastatic disease. Representative anterior segment 3 left liver 1.2 x 0.9 cm mass (series 6/image 17), posterior segment 31.4  x 1.0 cm mass (series 6/image 16) and segment 6 right liver 1.0 x 0.8 cm mass (series 6/image 22). Simple 1.0 cm segment 2 left liver cyst. Contracted gallbladder with mild diffuse gallbladder wall thickening. No cholelithiasis. No pericholecystic fluid. No biliary ductal dilatation. Common bile duct diameter 2 mm. No choledocholithiasis. No biliary masses, strictures or beading. Pancreas: No pancreatic mass or duct dilation.  No pancreas divisum. Spleen: Normal size. No mass. Adrenals/Urinary Tract: Normal adrenals. No hydronephrosis. Normal kidneys with no renal mass. Stomach/Bowel: Normal non-distended stomach. Visualized small and large bowel is normal caliber, with no bowel wall thickening. Vascular/Lymphatic: Normal caliber abdominal aorta. Patent portal, splenic, hepatic and renal veins. No pathologically enlarged lymph nodes in the abdomen. Other: No abdominal ascites or focal fluid collection. Musculoskeletal: Widespread patchy confluent enhancing lesions throughout the visualized skeleton compatible with bone metastases. IMPRESSION: 1. Innumerable indistinct small enhancing liver masses throughout the liver replacing much of the liver parenchyma compatible with metastatic disease. Mild hepatomegaly. 2. Widespread patchy confluent enhancing lesions throughout the visualized skeleton compatible with bone metastases. 3. Contracted gallbladder with nonspecific mild  diffuse gallbladder wall thickening. No cholelithiasis. No biliary ductal dilatation. No choledocholithiasis. Electronically Signed   By: Ilona Sorrel M.D.   On: 01/13/2021 10:24   [place] ASCITES (ABDOMEN LIMITED)  Result Date: 01/20/2021 CLINICAL DATA:  Metastatic breast cancer Ascites EXAM: LIMITED ABDOMEN ULTRASOUND FOR ASCITES TECHNIQUE: Limited ultrasound survey for ascites was performed in all four abdominal quadrants. COMPARISON:  None. FINDINGS: Trace mid lower abdominal ascites. IMPRESSION: Trace mid lower abdominal ascites. Electronically  Signed   By: Miachel Roux M.D.   On: 01/20/2021 16:40    Labs:  CBC: Recent Labs    01/06/21 0934 01/14/21 1412 01/21/21 1024 01/21/21 1133  WBC 6.7 14.1* 9.5 9.2  HGB 8.0* 8.1* 11.5* 11.8*  HCT 24.8* 24.8* 35.2* 37.6  PLT 116* 130* 95* 97*    COAGS: No results for input(s): INR, APTT in the last 8760 hours.  BMP: Recent Labs    01/06/21 0934 01/14/21 1412 01/21/21 1024 01/21/21 1133  NA 138 136 139 139  K 3.8 4.2 4.2 4.2  CL 108 101 108 108  CO2 21* 22 22 21*  GLUCOSE 125* 264* 122* 122*  BUN '10 19 12 12  '$ CALCIUM 8.3* 9.2 8.5* 8.3*  CREATININE 0.79 0.88 0.80 0.76  GFRNONAA >60 >60 >60 >60    LIVER FUNCTION TESTS: Recent Labs    01/06/21 0934 01/14/21 1412 01/21/21 1024 01/21/21 1133  BILITOT 1.5* 2.4* 6.9* 6.6*  AST 156* 161* 181* 179*  ALT 122* 178* 133* 122*  ALKPHOS 270* 303* 329* 287*  PROT 7.3 6.9 6.8 6.7  ALBUMIN 3.2* 2.9* 2.6* 2.8*    TUMOR MARKERS: No results for input(s): AFPTM, CEA, CA199, CHROMGRNA in the last 8760 hours.  Assessment and Plan: 40 y.o. female with past medical history of anemia, Graves' disease with prior thyroidectomy and postsurgical hypothyroidism, prediabetes, and stage IV right breast cancer, initially diagnosed in 2020.  Recent imaging has revealed innumerable small enhancing liver masses concerning for metastatic disease with patchy enhancing lesions throughout the visualized skeleton compatible with bone metastasis.  She presents today for image guided liver mass biopsy for further evaluation/prognostic panel.Risks and benefits of procedure was discussed with the patient  including, but not limited to bleeding, infection, damage to adjacent structures or low yield requiring additional tests.  All of the questions were answered and there is agreement to proceed.  Consent signed and in chart.    Thank you for this interesting consult.  I greatly enjoyed meeting [name] [name] and look forward to participating in their  care.  A copy of this report was sent to the requesting provider on this date.  Electronically Signed: D. Rowe Robert, PA-C 01/21/2021, 12:24 PM   I spent a total of  25 minutes   in face to face in clinical consultation, greater than 50% of which was counseling/coordinating care for image guided liver mass biopsy

## 2021-01-21 NOTE — Procedures (Signed)
Interventional Radiology Procedure:   Indications: Breast cancer with innumerable liver lesions  Procedure: US guided liver biopsy  Findings: Liver is diffusely heterogenous without discrete lesions on [place].  3 cores obtained from right hepatic lobe.  Gelfoam slurry injected after biopsy.  Complications: No immediate complications noted.     EBL: Minimal  Plan: Bedrest 3 hours, then discharge to home.    [name] [name] R. Anselm Pancoast, MD  Pager: [phone]

## 2021-01-22 ENCOUNTER — Other Ambulatory Visit (HOSPITAL_COMMUNITY): Payer: Self-pay

## 2021-01-22 ENCOUNTER — Inpatient Hospital Stay: Payer: Commercial Managed Care - PPO

## 2021-01-22 ENCOUNTER — Inpatient Hospital Stay (HOSPITAL_BASED_OUTPATIENT_CLINIC_OR_DEPARTMENT_OTHER): Payer: Commercial Managed Care - PPO | Admitting: Oncology

## 2021-01-22 ENCOUNTER — Other Ambulatory Visit: Payer: Self-pay

## 2021-01-22 VITALS — BP 112/83 | HR 103 | Temp 98.1°F | Resp 15

## 2021-01-22 VITALS — BP 107/71 | HR 121 | Temp 97.7°F | Resp 18 | Ht 65.0 in | Wt 185.5 lb

## 2021-01-22 DIAGNOSIS — C50811 Malignant neoplasm of overlapping sites of right female breast: Secondary | ICD-10-CM

## 2021-01-22 DIAGNOSIS — C7951 Secondary malignant neoplasm of bone: Secondary | ICD-10-CM

## 2021-01-22 DIAGNOSIS — Z170 Estrogen receptor positive status [ER+]: Secondary | ICD-10-CM | POA: Diagnosis not present

## 2021-01-22 DIAGNOSIS — C50411 Malignant neoplasm of upper-outer quadrant of right female breast: Secondary | ICD-10-CM

## 2021-01-22 DIAGNOSIS — Z5111 Encounter for antineoplastic chemotherapy: Secondary | ICD-10-CM | POA: Diagnosis not present

## 2021-01-22 DIAGNOSIS — C787 Secondary malignant neoplasm of liver and intrahepatic bile duct: Secondary | ICD-10-CM | POA: Diagnosis not present

## 2021-01-22 MED ORDER — CAPECITABINE 500 MG PO TABS
1500.0000 mg | ORAL_TABLET | Freq: Two times a day (BID) | ORAL | 0 refills | Status: DC
  Filled 2021-01-22: qty 30, 5d supply, fill #0

## 2021-01-22 MED ORDER — GOSERELIN ACETATE 3.6 MG ~~LOC~~ IMPL
3.6000 mg | DRUG_IMPLANT | Freq: Once | SUBCUTANEOUS | Status: AC
  Administered 2021-01-22: 3.6 mg via SUBCUTANEOUS
  Filled 2021-01-22: qty 3.6

## 2021-01-22 MED ORDER — DENOSUMAB 120 MG/1.7ML ~~LOC~~ SOLN
120.0000 mg | Freq: Once | SUBCUTANEOUS | Status: AC
  Administered 2021-01-22: 120 mg via SUBCUTANEOUS
  Filled 2021-01-22: qty 1.7

## 2021-01-22 NOTE — Telephone Encounter (Signed)
Oral Chemotherapy Pharmacist Encounter  I spoke with patient for overview of: Xeloda (capecitabine) for the  treatment of ER+, HER2- breast cancer, planned duration until disease progression or unacceptable drug toxicity.  Counseled patient on administration, dosing, side effects, monitoring, drug-food interactions, safe handling, storage, and disposal.  Patient will take Xeloda 55m tablets, 3 tablets (1,50104m by mouth in AM and 3 tabs (1,50032mby mouth in PM, within 30 minutes of finishing meals, for 14 days on, 7 days off, repeated every 21 days.  Xeloda start date: 01/22/21  Adverse effects include but are not limited to: fatigue, decreased blood counts, GI upset, diarrhea, mouth sores, and hand-foot syndrome.  Patient has anti-emetic on hand and knows to take it if nausea develops.   Patient will obtain anti diarrheal and alert the office of 4 or more loose stools above baseline.  Reviewed with patient importance of keeping a medication schedule and plan for any missed doses. No barriers to medication adherence identified.  Medication reconciliation performed and medication/allergy list updated.  I was contacted in regards to obtaining a 5 day supply from [facility] order to start therapy now instead of a delay until she receives medication from [facility] on 01/26/21. Patient was instructed to call me if she does not receive the medication by mail order on 01/26/21.   Patient informed the pharmacy will reach out 5-7 days prior to needing next fill of Xeloda to coordinate continued medication acquisition to prevent break in therapy.  All questions answered.  Mrs. MarRidgelyiced understanding and appreciation.   Medication education handout placed in mail for patient. Patient knows to call the office with questions or concerns. Oral Chemotherapy Clinic phone number provided to patient.   KaiDrema HalonharmD Hematology/Oncology Clinical  Pharmacist [phone]

## 2021-01-22 NOTE — Progress Notes (Signed)
[place]  [phone] [phone]     ID: [name] [name] DOB: 09/23/1980  MR#: [id number]  EXH#:[id number]  Patient Care Team: Townsend Roger, MD as PCP - General (Internal Medicine) Derwood Kaplan, MD as PCP - Hematology/Oncology (Oncology) Ranald Alessio, Virgie Dad, MD as Consulting Physician (Oncology) Jacelyn Pi, MD as Referring Physician (Endocrinology) Armandina Gemma, MD as Consulting Physician (General Surgery) Servando Salina, MD as Consulting Physician (Obstetrics and Gynecology) Raina Mina, RPH-CPP (Pharmacist) Chauncey Cruel, MD OTHER MD:  CHIEF COMPLAINT: metastatic breast cancer, estrogen receptor positive  CURRENT TREATMENT: capecitabine, goserelin, denosumab   INTERVAL HISTORY: [name] returns today for follow up of her metastatic breast cancer.  She is accompanied by her aunt Bonnita Nasuti who is visiting from [place]  Since her last visit, she underwent liver MRI on 01/12/2021 showing: innumerable indistinct small enhancing liver masses throughout the liver replacing much of the liver parenchyma; widespread patchy confluent enhancing lesions throughout the visualized skeleton.  Brain MRI 01/15/2021 showed: 1. Subtle, indeterminate dural thickening and mild nodularity along both frontal convexities. Some of these areas appear stable since 2020 suggesting benign etiology, and there is no associated cerebral edema, distinct overlying skull metastasis (see #2), or intra-axial brain metastasis identified. A short interval repeat Head MRI without and with contrast in 3 months would be helpful to evaluate stability. But in the meantime, CSF analysis could be considered if there is a high suspicion of dural metastases.  2. Diffusely decreased T1 bone marrow signal throughout the skull and visible cervical spine, but no destructive skull lesion. We are following her hemoglobin closely.  She has received multiple blood transfusions  and of course this is a concern because of iron overload.  On 01/20/2021 she was set up for paracentesis since the history we received suggestive ascites.  However there were no significant ascites found and paracentesis was not performed.  She had liver biopsy yesterday which shows metastatic adenocarcinoma consistent with her breast primary.  A repeat prognostic panel is pending and I have also requested an E-cadherin given the pattern of infiltration seen on the biopsy films.  Lab Results  Component Value Date   HGB 11.8 (L) 01/21/2021   HGB 11.5 (L) 01/21/2021   HGB 8.1 (L) 01/14/2021   HGB 8.0 (L) 01/06/2021   HGB 6.8 (LL) 12/26/2020   On 01/06/2021 we sent an anemia panel just to make sure there were no other causes of her anemia besides the bone marrow infiltration and of course any treatment effects.  Her ferritin was 4000, iron saturation 102%, folate 14, B12 was 3636.  LDH was elevated at 810.  Reticulocytes were inappropriately low at 42.5, but this does not entirely rule out hemolysis given the bone marrow infiltration.  I added a haptoglobin and DAT to today's labs in addition to additional liver labs.  REVIEW OF SYSTEMS: [name] tells me her eyes have turned yellow.  She stopped working 01/19/2021.  She is too fatigued to work.  She has been mostly staying at home.  She tolerated the liver biopsy with some discomfort but no evidence of bleeding.  She says her appetite is decreased and she has nausea with some vomiting occasionally.  Urine is very dark orange but not brown.  Taste is okay.  Bowel movements are brown.  She is not aware of any bleeding, and has not had any fever.  A detailed review of systems today was otherwise stable.  COVID 19 VACCINATION STATUS: Status post [facility]  x3, third dose October 2021; booster pending   HISTORY OF CURRENT ILLNESS: From the [facility] note dated 04/10/2020 by Dickey Gave, PA-C:   "[name] K Roe is a 40 y.o. female  African American female with stage IV (T3 N1 M1) hormone receptor positive right breast cancer diagnosed in December 2020. We began seeing in November 2020 for leukocytosis, anemia and thrombocytopenia.  She had bruising and nose bleeds, as well as a 10-15 lb weight loss.  She had been having chronic back pain for several months, for which she was using ibuprofen or Aleve.  LDH was markedly elevated at 2599, reticulocyte count mildly elevated at 3.4% and haptoglobin was less than 10, which was consistent with hemolysis, but Coombs was negative.  CT chest, abdomen and pelvis revealed diffusely sclerotic osseous structures with multiple lytic lesions noted throughout the axial skeleton.  Some lesions demonstrated expansile soft tissue components, and these findings are highly concerning for multiple myeloma or other osseous metastatic disease of uncertain origin.  A spiculated appearing mass or tissue element was also seen in the lateral right breast, 9 o'clock.  Bone marrow biopsy revealed abundant metastatic carcinoma, which was estrogen receptor positive, so consistent with breast cancer origin.  With that information, it became clear that she had malignant breast cancer that had metastasized to the bone.  She underwent a digital diagnostic bilateral mammogram with right breast ultrasound in December, which revealed highly suspicious calcifications spanning at least 9.2 cm in the upper outer quadrant of the right breast with distortion associated with the posterior calcifications.  A discrete mass was seen in the right breast with ultrasound at 10 o'clock, 8 cm from the nipple measuring 9 x 6 x 7 mm.  Several mildly abnormal nodes are seen in the right axilla with cortices measuring between 3 and 5 mm.  The lymph nodes were symmetric on mammography and were not definitely different compared to 2015.  Stereotactic needle core biopsy of the right breast and right axilla confirmed grade 2, invasive ductal carcinoma,  as well as ductal carcinoma in situ.  The invasive component measured 0.9 cm in greatest linear extent on the core biopsy.  Metastatic carcinoma was involved in one lymph node.  Estrogen receptor positive at 90%, progesterone receptor positive at 1%, and HER2 negative.  Ki67 was 15%.  Nuclear medicine PET scan revealed widespread hypermetabolic bony metastatic disease.  Low level hypermetabolism in bilateral axillary nodes, right greater than left was seen with uptake identified in the ill defined soft tissue lesion in the lateral right breast.  Foci of hypermetabolism identified in the central uterus along the IUD and along the anterior cervix and adjacent small bowel, which were nonspecific.  MRI head did not reveal intracranial metastasis, but diffusely abnormal bone marrow in the skull and cervical spine likely due to metastatic disease was observed.  CA 27-29 was not elevated.   Due to her age of diagnosis, she underwent testing for hereditary breast cancer with the Myriad  myRisk hereditary cancer panel test.  This did not reveal any clinically significant mutation.  There were variants of uncertain significance of the ATM, AXIN2 and MSH3 genes.   She had severer pain, so was placed on methadone, in addition to oxycodone and the doses were titrated up.  We recommended letrozole/palbociclib, denosumab monthly for the bone metastasis and goserelin monthly to suppress ovarian function due to being premenopausal.  She started letrozole on December 28th and denosumab and goserelin on January 7th.  She started palbociclib for 3 weeks on and one week off on January 9th.   She develops severe hypocalcemia, which required IV calcium replacement. Denosumab had to be held due to the severe hypocalcemia, but was resumed in March.  She was placed on high doses of oral calcium.  She had multiple other issues, such as decreased appetite, nausea and vomiting, as well  As dehydration, managed with medications. MRI thoracic  and lumbar spine on January 19th revealed diffusely decreased T1 bone marrow signal with fairly homogeneous enhancement throughout the thoracolumbar spine.  Prior PET-CT demonstrated homogeneous hypermetabolic activity throughout the thoracolumbar spine.  These findings are favored to be related to the patient's anemia rather than diffuse metastatic disease.  There were possible small osseous metastases, notably within the T3 spinous process, left L2 pedicle and upper right sacrum.  There was no significant disc herniation, spinal stenosis or nerve root encroachment.  She was subsequently weaned off methadone and has not had recurrent pain.  She developed cytopenias due to palbociclib and we occasionally had to delay cycles   CT chest, abdomen and pelvis in May revealed diffuse patchy confluent sclerotic osseous metastatic disease throughout the axial and proximal appendicular skeleton, probably representing treatment effect.  ThereWere healed deformities throughout the bilateral ribs.  A new mild right axillary lymphadenopathy was also observed, which wasnonspecific.  No additional sites of metastatic disease were seen in the chest, abdomen or pelvis.  She had a  tubal ligation and removal of her Mirena IUD in June.  CT chest, abdomen and pelvis in September revealed again widespread osseous sclerosis, indicative of treated metastatic disease.  The previous borderline enlarged right axillary lymph nodes had resolved.  She had recurrent cytopenias, and since we had to delay multiple cycles of palbociclib, we decreased her palbociclib dose to 100 mg daily for 3 weeks on a week off."  The patient's subsequent history is as detailed below.   PAST MEDICAL HISTORY: Past Medical History:  Diagnosis Date   Anemia    History of cardiac murmur as a child    History of Graves' disease    dx hyperthyroidism during 4th pregnancy;   11-15-2013  s/p  total thyroidectomy   Hypothyroidism, postsurgical 11/15/2013    endocrinologist--- dr balan   Pre-diabetes    Stage IV breast cancer in female [facility]) 05/2019   oncologist-- dr c. Hinton Rao ([facility]) bone marrow bx 05-08-2019 and right breast bx 05-17-2020--- primary breast w/ mets to bones, pt taking oral chemo (ibrance)  Tumor associated hemolysis at presentation December 2020   PAST SURGICAL HISTORY: Past Surgical History:  Procedure Laterality Date   IUD REMOVAL N/A 11/22/2019   Procedure: INTRAUTERINE DEVICE (IUD) REMOVAL;  Surgeon: Servando Salina, MD;  Location: [facility];  Service: Gynecology;  Laterality: N/A;   LAPAROSCOPIC TUBAL LIGATION Bilateral 11/22/2019   Procedure: LAPAROSCOPIC TUBAL LIGATION By Cautery;  Surgeon: Servando Salina, MD;  Location: [facility];  Service: Gynecology;  Laterality: Bilateral;   THYROIDECTOMY Bilateral 11/15/2013   Procedure: TOTAL THYROIDECTOMY;  Surgeon: Earnstine Regal, MD;  Location: [facility];  Service: General;  Laterality: Bilateral;   WISDOM TOOTH EXTRACTION  2012    FAMILY HISTORY: Family History  Problem Relation Age of Onset   Cancer Mother        Peripheral T cell Lymphoma   Diabetes Father    Diabetes Maternal Grandmother   The patient's mother died at age 27 shortly after her diagnosis of T-cell non-Hodgkin's  lymphoma. The patient's father is 78 years old as of December 2020. He has a history of diabetes, is a bilateral amputee, and has a history of EtOH abuse. The patient has 2 half siblings on her mother side and 3/2 siblings on her father's side. None of them have a history of cancer. She has a maternal cousin with a history of B-cell non-Hodgkin's lymphoma and a maternal grandfather with prostate cancer   GYNECOLOGIC HISTORY:  Patient's last menstrual period was 10/26/2018. Menarche: 40 years old Age at first live birth: 40 years old [place] P 4 LMP December 2020 (when goserelin started Contraceptive: Status post bilateral tubal ligation HRT n/a   Hysterectomy? no BSO? no   SOCIAL HISTORY: (updated 04/2020)  [name] is divorced. Her former husband Cardelia Sassano is a Armed forces technical officer for the [employer]. 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  She works on Friday Saturdays and Sundays only.  Her daughters Jeanella Cara, and [place], age 45-9 as of December 2021. They stay 1 week with the patient in 1 week with her ex-husband. The patient also has a burn doodle called Karlene Lineman. The patient is a Psychologist, forensic    ADVANCED DIRECTIVES: Not in place. At the 05/06/2020 visit the patient was given the appropriate documents to complete and notarized at her discretion.   HEALTH MAINTENANCE: Social History   Tobacco Use   Smoking status: Never   Smokeless tobacco: Never  Vaping Use   Vaping Use: Never used  Substance Use Topics   Alcohol use: Not Currently    Comment: social   Drug use: Never     Colonoscopy: n/a (age)  PAP: Up-to-date  Bone density: Remote   No Known Allergies  Current Outpatient Medications  Medication Sig Dispense Refill   capecitabine (XELODA) 500 MG tablet Take 3 tablets (1,500 mg total) by mouth 2 (two) times daily after a meal. Start 01/22/2021 with supper; continue for 14 days 30 tablet 0   b complex vitamins capsule Take 1 capsule by mouth daily.     Calcium Carb-Cholecalciferol (CALCIUM 600 + D PO) Take 4 tablets by mouth daily.     capecitabine (XELODA) 500 MG tablet Take 3 tablets (1,500 mg total) by mouth 2 (two) times daily after a meal. Take on days 1-14. Repeat every 21 days. 84 tablet 4   Cholecalciferol (VITAMIN D3) 25 MCG (1000 UT) CAPS Take 3 capsules by mouth daily.     Denosumab (XGEVA [place]) Inject into the skin every 30 (thirty) days.     dexamethasone (DECADRON) 4 MG tablet Take 1 tablet (4 mg total) by mouth 2 (two) times daily with a meal. 20 tablet 0   Goserelin Acetate (ZOLADEX [place]) Inject into the skin every 30 (thirty) days.     levothyroxine (SYNTHROID) 112 MCG tablet 1 tablet in  the morning on an empty stomach     ondansetron (ZOFRAN) 8 MG tablet Take 1 tablet (8 mg total) by mouth 3 (three) times daily as needed for nausea or vomiting. 18 tablet 1   valACYclovir (VALTREX) 500 MG tablet Take 1 tablet (500 mg total) by mouth daily. (Patient not taking: Reported on 01/08/2021) 90 tablet 4   No current facility-administered medications for this visit.    OBJECTIVE: African-American woman who appears stated age  57:   01/22/21 1243  BP: 107/71  Pulse: (!) 121  Resp: 18  Temp: 97.7 F (36.5 C)  SpO2: 98%      Body mass index is 30.87  kg/m.   Wt Readings from Last 3 Encounters:  01/22/21 185 lb 8 oz (84.1 kg)  01/08/21 185 lb 11.2 oz (84.2 kg)  12/26/20 189 lb 9.6 oz (86 kg)     ECOG FS:1 - Symptomatic but completely ambulatory  Sclerae mildly icteric, EOMs intact Wearing a mask Lungs no rales or rhonchi Heart regular rate and rhythm Abd soft, nontender including over the right upper quadrant, positive bowel sounds Neuro: nonfocal, well oriented x3, at times overwhelmed during today's visit but overall appropriate affect Breasts:    LAB RESULTS:  CMP     Component Value Date/Time   NA 139 01/21/2021 1133   NA 140 04/16/2020 0000   K 4.2 01/21/2021 1133   CL 108 01/21/2021 1133   CO2 21 (L) 01/21/2021 1133   GLUCOSE 122 (H) 01/21/2021 1133   BUN 12 01/21/2021 1133   BUN 18 04/16/2020 0000   CREATININE 0.76 01/21/2021 1133   CREATININE 0.93 11/11/2020 1034   CALCIUM 8.3 (L) 01/21/2021 1133   PROT 6.7 01/21/2021 1133   ALBUMIN 2.8 (L) 01/21/2021 1133   AST 179 (H) 01/21/2021 1133   AST 38 11/11/2020 1034   ALT 122 (H) 01/21/2021 1133   ALT 33 11/11/2020 1034   ALKPHOS 287 (H) 01/21/2021 1133   BILITOT 6.6 (H) 01/21/2021 1133   BILITOT 0.3 11/11/2020 1034   GFRNONAA >60 01/21/2021 1133   GFRNONAA >60 11/11/2020 1034   GFRAA >60 04/28/2019 1218    No results found for: TOTALPROTELP, ALBUMINELP, A1GS, A2GS, BETS, BETA2SER, GAMS,  MSPIKE, SPEI  Lab Results  Component Value Date   WBC 9.2 01/21/2021   NEUTROABS 5.4 01/21/2021   HGB 11.8 (L) 01/21/2021   HCT 37.6 01/21/2021   MCV 90.6 01/21/2021   PLT 97 (L) 01/21/2021    No results found for: LABCA2  No components found for: [id number]  Recent Labs  Lab 01/21/21 1133  INR 1.1    No results found for: LABCA2  No results found for: WUJ811  No results found for: BJY782  No results found for: NFA213  Lab Results  Component Value Date   CA2729 16.2 11/27/2020    No components found for: HGQUANT  No results found for: CEA1 / No results found for: CEA1   No results found for: AFPTUMOR  No results found for: CHROMOGRNA  No results found for: KPAFRELGTCHN, LAMBDASER, KAPLAMBRATIO (kappa/lambda light chains)  No results found for: HGBA, HGBA2QUANT, HGBFQUANT, HGBSQUAN (Hemoglobinopathy evaluation)   Lab Results  Component Value Date   LDH 1,981 (H) 01/14/2021    Lab Results  Component Value Date   IRON 176 (H) 01/14/2021   TIBC 176 (L) 01/14/2021   IRONPCTSAT 100 (H) 01/14/2021   (Iron and TIBC)  Lab Results  Component Value Date   FERRITIN 2,994 (H) 01/14/2021    Urinalysis    Component Value Date/Time   COLORURINE YELLOW 04/28/2019 1223   APPEARANCEUR HAZY (A) 04/28/2019 1223   LABSPEC 1.024 04/28/2019 1223   PHURINE 5.0 04/28/2019 1223   GLUCOSEU NEGATIVE 04/28/2019 [place] 04/28/2019 [place] 04/28/2019 [place] 04/28/2019 [place] 04/28/2019 1223   NITRITE NEGATIVE 04/28/2019 [place] 04/28/2019 1223    STUDIES: MR Brain W Wo Contrast  Result Date: 01/19/2021 CLINICAL DATA:  40 year old female with metastatic breast cancer. Staging. EXAM: MRI HEAD WITHOUT AND WITH CONTRAST TECHNIQUE: Multiplanar, multiecho pulse sequences of the brain and surrounding  structures were obtained without and with intravenous contrast. CONTRAST:  25m  GADAVIST GADOBUTROL 1 MMOL/ML IV SOLN [facility] FINDINGS: Brain: There is a mildly undulating appearance of the enhancing packing meninges along the frontal convexities, more so on the right (series 16, image 88). And 1 of these rounded areas of dural thickening (that seen on series 16, image 101) is stable since 2020 and appears benign. However, a larger 10 mm area along the right inferior frontal gyrus on series 16, image 81 is new but only 3 mm thick. This lesion is visible on the coronal postcontrast series 19, image 6 (the more inferior and lateral) where similar superior dural nodularity appears fairly stable since the 2020 postcontrast coronal. Furthermore, contralateral left frontal convexity mild dural thickening and nodularity (series 16, image 99) also appears similar 2020. The bilateral dural changes are also visible on axial FLAIR (series 11, images 32 and 35). No abnormal intra-axial enhancement (vascular related enhancement suspected in the left basal ganglia on series 16, image 74). No cerebral edema. No restricted diffusion to suggest acute infarction. No midline shift, mass effect, ventriculomegaly, extra-axial collection or acute intracranial hemorrhage. Cervicomedullary junction and pituitary are within normal limits. Scattered small mostly subcortical white matter T2 and FLAIR hyperintensity in both hemispheres has mildly increased since 2020, now mild to moderate for age. This is in a nonspecific configuration. No chronic cerebral blood products. No cortical encephalomalacia. No restricted diffusion to suggest acute infarction. No midline shift, mass effect, evidence of mass lesion, ventriculomegaly, extra-axial collection or acute intracranial hemorrhage. Cervicomedullary junction and pituitary are within normal limits. Vascular: Major intracranial vascular flow voids appear stable. The major dural venous sinuses are enhancing and appear to  be patent. Skull and upper cervical spine: Generalized decreased bone marrow signal throughout the skull and cervical spine was present in 2020, with no destructive or discrete suspicious bone lesion identified. Sinuses/Orbits: Stable, negative. Other: Mastoids are clear. Visible internal auditory structures appear normal. Visible scalp and face appear negative. IMPRESSION: 1. Subtle, indeterminate dural thickening and mild nodularity along both frontal convexities. Some of these areas appear stable since 2020 suggesting benign etiology, and there is no associated cerebral edema, distinct overlying skull metastasis (see #2), or intra-axial brain metastasis identified. A short interval repeat Head MRI without and with contrast in 3 months would be helpful to evaluate stability. But in the meantime, CSF analysis could be considered if there is a high suspicion of dural metastases. 2. Diffusely decreased T1 bone marrow signal throughout the skull and visible cervical spine, but no destructive skull lesion. Electronically Signed   By: HGenevie AnnM.D.   On: 01/19/2021 06:24   MR LIVER W WO CONTRAST  Result Date: 01/13/2021 CLINICAL DATA:  Metastatic breast cancer. Evaluate possible liver metastasis. EXAM: MRI ABDOMEN WITHOUT AND WITH CONTRAST TECHNIQUE: Multiplanar multisequence MR imaging of the abdomen was performed both before and after the administration of intravenous contrast. CONTRAST:  89mGADAVIST GADOBUTROL 1 MMOL/ML IV SOLN COMPARISON:  02/13/2020 CT chest, abdomen and pelvis. FINDINGS: Lower chest: No acute abnormality at the lung bases. Hepatobiliary: Mild hepatomegaly. No hepatic steatosis. Innumerable indistinct small enhancing mildly T2 hyperintense liver masses throughout the liver replacing much of the liver parenchyma compatible with metastatic disease. Representative anterior segment 3 left liver 1.2 x 0.9 cm mass (series 6/image 17), posterior segment 31.4 x 1.0 cm mass (series 6/image 16) and  segment 6 right liver 1.0 x 0.8 cm mass (series  6/image 22). Simple 1.0 cm segment 2 left liver cyst. Contracted gallbladder with mild diffuse gallbladder wall thickening. No cholelithiasis. No pericholecystic fluid. No biliary ductal dilatation. Common bile duct diameter 2 mm. No choledocholithiasis. No biliary masses, strictures or beading. Pancreas: No pancreatic mass or duct dilation.  No pancreas divisum. Spleen: Normal size. No mass. Adrenals/Urinary Tract: Normal adrenals. No hydronephrosis. Normal kidneys with no renal mass. Stomach/Bowel: Normal non-distended stomach. Visualized small and large bowel is normal caliber, with no bowel wall thickening. Vascular/Lymphatic: Normal caliber abdominal aorta. Patent portal, splenic, hepatic and renal veins. No pathologically enlarged lymph nodes in the abdomen. Other: No abdominal ascites or focal fluid collection. Musculoskeletal: Widespread patchy confluent enhancing lesions throughout the visualized skeleton compatible with bone metastases. IMPRESSION: 1. Innumerable indistinct small enhancing liver masses throughout the liver replacing much of the liver parenchyma compatible with metastatic disease. Mild hepatomegaly. 2. Widespread patchy confluent enhancing lesions throughout the visualized skeleton compatible with bone metastases. 3. Contracted gallbladder with nonspecific mild diffuse gallbladder wall thickening. No cholelithiasis. No biliary ductal dilatation. No choledocholithiasis. Electronically Signed   By: Ilona Sorrel M.D.   On: 01/13/2021 10:24   [place] ASCITES (ABDOMEN LIMITED)  Result Date: 01/20/2021 CLINICAL DATA:  Metastatic breast cancer Ascites EXAM: LIMITED ABDOMEN ULTRASOUND FOR ASCITES TECHNIQUE: Limited ultrasound survey for ascites was performed in all four abdominal quadrants. COMPARISON:  None. FINDINGS: Trace mid lower abdominal ascites. IMPRESSION: Trace mid lower abdominal ascites. Electronically Signed   By: Miachel Roux M.D.   On:  01/20/2021 16:40   [place] CORE BIOPSY (LIVER)  Result Date: 01/21/2021 INDICATION: 40 year old with breast cancer and evidence for innumerable hepatic lesions. Tissue diagnosis is needed. EXAM: Ultrasound-guided liver biopsy MEDICATIONS: Moderate sedation ANESTHESIA/SEDATION: Moderate (conscious) sedation was employed during this procedure. A total of Versed 2.0 mg and Fentanyl 100 mcg was administered intravenously. Moderate Sedation Time: 15 minutes. The patient's level of consciousness and vital signs were monitored continuously by radiology nursing throughout the procedure under my direct supervision. FLUOROSCOPY TIME:  Fluoroscopy Time: None COMPLICATIONS: None immediate. PROCEDURE: Informed written consent was obtained from the patient after a thorough discussion of the procedural risks, benefits and alternatives. All questions were addressed. A timeout was performed prior to the initiation of the procedure. Liver was thoroughly evaluated with ultrasound. The liver is diffusely heterogeneous without discrete lesions. Based on the previous MRI, findings are compatible with infiltrative disease in the liver. As a result, the inferior right hepatic lobe was selected due to easy accessibility and heterogeneity in this area. The right side of the abdomen was prepped with chlorhexidine and sterile field was created. Maximal barrier sterile technique was utilized including caps, mask, sterile gowns, sterile gloves, sterile drape, hand hygiene and skin antiseptic. Skin was anesthetized using 1% lidocaine. Small incision was made. Using ultrasound guidance, 17 gauge coaxial needle was directed into the right hepatic lobe. Total of 3 core biopsies were obtained with an 18 gauge core device. Specimens placed in formalin. Gel-Foam slurry was injected through the 17 gauge needle as it was removed. Bandage placed over the puncture site. FINDINGS: Liver is diffusely heterogeneous without discrete lesions. As a result,  heterogeneous area in the right hepatic lobe was targeted for biopsy. Three adequate specimens obtained. No immediate bleeding or hematoma formation. IMPRESSION: Ultrasound-guided core biopsies from the right hepatic lobe. Electronically Signed   By: Markus Daft M.D.   On: 01/21/2021 14:27     ELIGIBLE FOR AVAILABLE RESEARCH PROTOCOL: no  ASSESSMENT: 40 y.o. [employer]  woman presenting NOV 2021 with stage IV breast cancer as follows:  (a) workup of initial pancytopenia and hemolysis showed a leukoerythroblastic blood picture with bone marrow biopsy 05/07/2020 confirming metastatic carcinoma, estrogen receptor positive; cytogenetics were normal  (b) CT scans at baseline showing multiple bone lesions (both sclerotic and lytic) and a possible mass in the right breast; no definitive lung or liver involvement  (c) right breast upper outer quadrant biopsy x2 and right axillary lymph node biopsy on 05/18/2019 confirmed a clinical 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, grade 1-2, estrogen receptor strongly positive, progesterone receptor moderately positive (1%), HER-2 negative, with an MIB-1 of 2-15%  (d) CA 27-29 was not informative (repeat 05/06/2020 WNL)  (1) letrozole started 05/28/2019  (a) goserelin started 06/07/2019, repeated every 4 weeks  (b) palbociclib added 06/09/2019  (c) palbociclib dose decreased to 100 mg daily, 21/7, with September 2021 cycle  (d)   (2) denosumab/Xgeva started 06/07/2019, repeated every 4 weeks  (3) genetics testing through the myriad MyRisk panel dated 09/22/2019 found no deleterious mutations in the genes tested which included BRCA 1 and 2, CHEK2, PALB 2 and TP53 among others.  (a) variants of uncertain significance were found in ATM, AXN 2, and [place] 3.  (4) restaging studies:  (a) chest CT and bone scan 05/21/2020 shows no visceral disease, stable bone lesions  (b) bilateral mammography and right breast ultrasonography 06/25/2020 shows the measurable disease  in the right breast to have decreased.  There is some progressive calcifications that require monitoring  (c) bone scan 11/21/2020 shows no new bone lesions  (d) breast ultrasound 11/12/2020 shows the previously noted 10:00 mass to have resolved.  The right axillary adenopathy also has resolved.  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  She is minimally pruritic.  The liver involvement is infiltrative which would be more consistent with a lobular breast cancer and I have requested an eke adherent to confirm although the pathologist tells me on consultation today that the morphology is ductal.  The prognostic panel is pending but I suspect we are going to be dealing with estrogen and progesterone receptor negative metastases given the lack of response to her earlier treatment.  Accordingly we are going to go with capecitabine.  Thanks to our oral chemotherapy pharmacy specialists we were able to get her enough pills so she could start this evening.  She will be receiving her first shipment on Monday, 01/18/2028.  She understands how to take these medications and what the possible toxicities side effects and complications are.  I also alerted her to the possibility of fever and abdominal pain which would indicate cholangitis.  If either of those symptoms develop she needs to contact us  immediately and if it is after hours to go to the emergency room.  I have scheduled her for return in 1 week the following week but urged her to contact us with any unusual symptoms.  I also gave her a copy of the healthcare power of attorney and urged her to completed and have it notarized in the near future.  Total encounter time 35 minutes.Sarajane Jews C. Shelley Pooley, MD 01/22/21 7:15 PM Medical Oncology and Hematology [facility] [place], [place] [address] Tel. [phone]    Fax. [phone]   I, Wilburn Mylar, am acting as scribe for Dr. Virgie Dad. Notnamed Croucher.  I, Lurline Del MD, have reviewed the above documentation for accuracy and completeness, and I agree with the above.    *Total Encounter Time as defined by the Centers for Medicare and Medicaid Services includes, in addition to the face-to-face time of a patient visit (documented in the note above) non-face-to-face time: obtaining and reviewing outside history, ordering and reviewing medications, tests or procedures, care coordination (communications with other health care professionals or caregivers) and documentation in the medical record.

## 2021-01-26 ENCOUNTER — Ambulatory Visit (HOSPITAL_COMMUNITY): Payer: Commercial Managed Care - PPO

## 2021-01-27 LAB — SURGICAL PATHOLOGY

## 2021-01-28 ENCOUNTER — Inpatient Hospital Stay (HOSPITAL_BASED_OUTPATIENT_CLINIC_OR_DEPARTMENT_OTHER): Payer: Commercial Managed Care - PPO | Admitting: Hematology

## 2021-01-28 ENCOUNTER — Inpatient Hospital Stay: Payer: Commercial Managed Care - PPO

## 2021-01-28 ENCOUNTER — Other Ambulatory Visit: Payer: Self-pay

## 2021-01-28 VITALS — BP 111/77 | HR 92 | Temp 98.4°F | Resp 18 | Wt 183.1 lb

## 2021-01-28 DIAGNOSIS — C50811 Malignant neoplasm of overlapping sites of right female breast: Secondary | ICD-10-CM

## 2021-01-28 DIAGNOSIS — D5919 Other autoimmune hemolytic anemia: Secondary | ICD-10-CM

## 2021-01-28 DIAGNOSIS — C50411 Malignant neoplasm of upper-outer quadrant of right female breast: Secondary | ICD-10-CM | POA: Diagnosis not present

## 2021-01-28 DIAGNOSIS — C787 Secondary malignant neoplasm of liver and intrahepatic bile duct: Secondary | ICD-10-CM

## 2021-01-28 DIAGNOSIS — D582 Other hemoglobinopathies: Secondary | ICD-10-CM

## 2021-01-28 DIAGNOSIS — Z170 Estrogen receptor positive status [ER+]: Secondary | ICD-10-CM

## 2021-01-28 DIAGNOSIS — C7951 Secondary malignant neoplasm of bone: Secondary | ICD-10-CM | POA: Diagnosis not present

## 2021-01-28 DIAGNOSIS — E0500 Thyrotoxicosis with diffuse goiter without thyrotoxic crisis or storm: Secondary | ICD-10-CM

## 2021-01-28 DIAGNOSIS — Z5111 Encounter for antineoplastic chemotherapy: Secondary | ICD-10-CM | POA: Diagnosis not present

## 2021-01-28 LAB — COMPREHENSIVE METABOLIC PANEL
ALT: 93 U/L — ABNORMAL HIGH (ref 0–44)
AST: 201 U/L (ref 15–41)
Albumin: 2.5 g/dL — ABNORMAL LOW (ref 3.5–5.0)
Alkaline Phosphatase: 320 U/L — ABNORMAL HIGH (ref 38–126)
Anion gap: 11 (ref 5–15)
BUN: 8 mg/dL (ref 6–20)
CO2: 21 mmol/L — ABNORMAL LOW (ref 22–32)
Calcium: 8.3 mg/dL — ABNORMAL LOW (ref 8.9–10.3)
Chloride: 105 mmol/L (ref 98–111)
Creatinine, Ser: 0.73 mg/dL (ref 0.44–1.00)
GFR, Estimated: >60 mL/min (ref >60)
Glucose, Bld: 151 mg/dL — ABNORMAL HIGH (ref 70–99)
Potassium: 3.6 mmol/L (ref 3.5–5.1)
Sodium: 137 mmol/L (ref 135–145)
Total Bilirubin: 6.1 mg/dL (ref 0.3–1.2)
Total Protein: 7 g/dL (ref 6.5–8.1)

## 2021-01-28 LAB — PROTIME-INR
INR: 1.2 (ref 0.8–1.2)
Prothrombin Time: 15.2 seconds (ref 11.4–15.2)

## 2021-01-28 LAB — CBC WITH DIFFERENTIAL/PLATELET
Abs Immature Granulocytes: 0.15 10*3/uL — ABNORMAL HIGH (ref 0.00–0.07)
Basophils Absolute: 0.1 10*3/uL (ref 0.0–0.1)
Basophils Relative: 1 %
Eosinophils Absolute: 0 10*3/uL (ref 0.0–0.5)
Eosinophils Relative: 1 %
HCT: 30.2 % — ABNORMAL LOW (ref 36.0–46.0)
Hemoglobin: 9.7 g/dL — ABNORMAL LOW (ref 12.0–15.0)
Immature Granulocytes: 2 %
Lymphocytes Relative: 33 %
Lymphs Abs: 2 10*3/uL (ref 0.7–4.0)
MCH: 29 pg (ref 26.0–34.0)
MCHC: 32.1 g/dL (ref 30.0–36.0)
MCV: 90.1 fL (ref 80.0–100.0)
Monocytes Absolute: 0.7 10*3/uL (ref 0.1–1.0)
Monocytes Relative: 11 %
Neutro Abs: 3.3 10*3/uL (ref 1.7–7.7)
Neutrophils Relative %: 52 %
Platelets: 133 10*3/uL — ABNORMAL LOW (ref 150–400)
RBC: 3.35 MIL/uL — ABNORMAL LOW (ref 3.87–5.11)
RDW: 22.9 % — ABNORMAL HIGH (ref 11.5–15.5)
WBC: 6.2 10*3/uL (ref 4.0–10.5)
nRBC: 17.4 % — ABNORMAL HIGH (ref 0.0–0.2)

## 2021-01-28 LAB — LACTATE DEHYDROGENASE: LDH: 1846 U/L — ABNORMAL HIGH (ref 98–192)

## 2021-01-28 LAB — TYPE AND SCREEN
ABO/RH(D): A POS
Antibody Screen: NEGATIVE

## 2021-01-28 LAB — DIRECT ANTIGLOBULIN TEST (NOT AT ARMC)
DAT, IgG: NEGATIVE
DAT, complement: NEGATIVE

## 2021-01-28 LAB — APTT: aPTT: 30 seconds (ref 24–36)

## 2021-01-28 MED ORDER — PROCHLORPERAZINE MALEATE 10 MG PO TABS: 10.0000 mg | ORAL_TABLET | Freq: Four times a day (QID) | ORAL | 0 refills | Status: DC | PRN

## 2021-01-28 NOTE — Progress Notes (Addendum)
[place]   [phone] [phone]) [phone]   Clinic Follow up Note   Patient Care Team: Nona Dell, Corene Cornea, MD as PCP - General (Internal Medicine) Derwood Kaplan, MD as PCP - Hematology/Oncology (Oncology) Magrinat, Virgie Dad, MD as Consulting Physician (Oncology) Jacelyn Pi, MD as Referring Physician (Endocrinology) Armandina Gemma, MD as Consulting Physician (General Surgery) Servando Salina, MD as Consulting Physician (Obstetrics and Gynecology) Raina Mina, RPH-CPP (Pharmacist)  Date of Service:  01/28/2021  CHIEF COMPLAINT: f/u of metastatic breast cancer  CURRENT THERAPY:  capecitabine, goserelin, denosumab  ASSESSMENT & PLAN:  [name] [name] is a 40 y.o. female with   1. Stage IV Breast Cancer, ER+/HER2 low expression (IHC 2+) -workup of initial pancytopenia and hemolysis showed a leukoerythroblastic blood picture with bone marrow biopsy 05/08/2019 confirming metastatic carcinoma, estrogen receptor positive; cytogenetics were normal -right breast upper outer quadrant biopsy x2 and right axillary lymph node biopsy on 05/18/2019 confirmed a clinical 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, grade 1-2, estrogen receptor strongly positive, progesterone receptor moderately positive (1%), HER-2 negative, with an MIB-1 of 2-15% -CA 27-29 was not informative (repeat 05/06/2020 WNL) -started letrozole 05/28/19, goserelin q4weeks 06/07/19, palbociclib 06/09/19 (decreased 01/2020), and denosumab q4weeks 06/07/19. -CT scans in 04/2020 at baseline showing multiple bone lesions (both sclerotic and lytic) and a possible mass in the right breast; no definitive lung or liver involvement -MRI of the liver shows innumerable small enhancing liver masses consistent with metastatic disease; bone metastases were again noted -brain MRI 01/15/2021 shows some meningeal thickening which was not new, repeat suggested in 3 months -abdominal ultrasound 01/20/2021 showed  no evidence of ascites -liver biopsy 01/21/2021 confirms metastatic carcinoma, ER 90% positive, PR 40% positive, Ki67 40%, Her2 low expression with IHC 2+,negative by FISH. -letrozole and palbociclib discontinued 12/2020 with progression. -Switched to Xeloda on 01/21/21, 14 days on and 7 days off. She is tolerating well so far. -I ordered repeat abdomen ultrasound to be performed tomorrow to see if there is any biliary obstruction -Lab reviewed, she has persistent hyperbilirubinemia with total bilirubin 6.1 today.  We discussed that Xeloda may take 1-2 months to see clinic benefit -given her tumor has low HER2 expression, I also discussed recent new data of Enhertu in low HER2 expressed breast cancer, which showed superior response then chemotherapy. If she does not respond to Xeloda, I would try Enhertu  -Also there is no data of tucatinib low HER2 expressed breast cancer, I think it's reasonable to consider adding tucatinib to Xarelto, given her very limited treatment options   2. Symptom Management: jaundice, nausea/vomiting, abdominal discomfort -she is on Zofran for nausea, but it is "hit or miss" as to if it works. -I prescribed compazine for her to use with the zofran  2. Genetics testing -genetics testing through the myriad MyRisk panel dated 09/22/2019 found no deleterious mutations in the genes tested which included BRCA 1 and 2, CHEK2, PALB 2 and TP53 among others. -variants of uncertain significance were found in ATM, AXN 2, and [place] 3.  3. Anemia -secondary to tumor infiltration in the marrow as well as treatment -EPO started 11/27/2020, repeated monthly -Hemoglobin 9.7 today (01/28/21)   PLAN: -Continue Xeloda  -I prescribed compazine -I ordered abdomen US to be done tomorrow to rule out biliary obstruction -labs and f/u as scheduled with Dr. Jana Hakim on 02/04/21 -I will discus with Dr. Jana Hakim about adding Tucatinib    No problem-specific Assessment & Plan notes found for  this encounter.   SUMMARY OF ONCOLOGIC HISTORY: Oncology History  Malignant neoplasm of upper-outer quadrant of right breast in female, estrogen receptor positive ([place])  05/06/2020 Initial Diagnosis   Malignant neoplasm of upper-outer quadrant of right breast in female, estrogen receptor positive ([place])   05/06/2020 Cancer Staging   Staging form: Breast, AJCC 8th Edition - Clinical: Stage IV (cT1, cN1, pM1, ER+, PR+, HER2-) - Signed by Chauncey Cruel, MD on 05/06/2020   01/27/2021 -  Chemotherapy    Patient is on Treatment Plan: BREAST CAPECITABINE Q21D       Liver metastases ([place])  01/14/2021 Initial Diagnosis   Liver metastases ([place])   01/27/2021 -  Chemotherapy    Patient is on Treatment Plan: BREAST CAPECITABINE Q21D          INTERVAL HISTORY:  [name] K Gloor is here for a follow up of metastatic breast cancer. I am seeing this patient out of courtesy for Dr. Jana Hakim in his absence. She presents to the clinic accompanied by her boyfriend. She reports her urine is still orange. She also reports continued nausea with vomiting; she notes the medicine is "hit or miss." She reports a "heaviness" in her stomach at night. She notes she has been taking the Xeloda for 7 days so far. She reports she has only experienced nausea and vomiting so far.   All other systems were reviewed with the patient and are negative.  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 Hinton Rao ([facility]) bone marrow bx 05-08-2019 and right breast bx 05-17-2020--- primary breast w/ mets to bones, pt taking oral chemo ([facility])    SURGICAL HISTORY: Past Surgical  History:  Procedure Laterality Date   IUD REMOVAL N/A 11/22/2019   Procedure: INTRAUTERINE DEVICE (IUD) REMOVAL;  Surgeon: Servando Salina, MD;  Location: [facility];  Service: Gynecology;  Laterality: N/A;   LAPAROSCOPIC TUBAL LIGATION Bilateral 11/22/2019   Procedure: LAPAROSCOPIC TUBAL LIGATION By Cautery;  Surgeon: Servando Salina, MD;  Location: [facility];  Service: Gynecology;  Laterality: Bilateral;   THYROIDECTOMY Bilateral 11/15/2013   Procedure: TOTAL THYROIDECTOMY;  Surgeon: Earnstine Regal, MD;  Location: [facility];  Service: General;  Laterality: Bilateral;   WISDOM TOOTH EXTRACTION  2012    I have reviewed the social history and family history with the patient and they are unchanged from previous note.  ALLERGIES:  has No Known Allergies.  MEDICATIONS:  Current Outpatient Medications  Medication Sig Dispense Refill   prochlorperazine (COMPAZINE) 10 MG tablet Take 1 tablet (10 mg total) by mouth every 6 (six) hours as needed for nausea or vomiting. 30 tablet 0   b complex vitamins capsule Take 1 capsule by mouth daily.     Calcium Carb-Cholecalciferol (CALCIUM 600 + D PO) Take 4 tablets by mouth daily.     capecitabine (XELODA) 500 MG tablet Take 3 tablets (1,500 mg total) by mouth 2 (two) times daily after a meal. Take on days 1-14. Repeat every 21 days. 84 tablet 4   capecitabine (XELODA) 500 MG tablet Take 3 tablets (1,500 mg total) by mouth 2 (  two) times daily after a meal. Start 01/22/2021 with supper; continue for 14 days 30 tablet 0   Cholecalciferol (VITAMIN D3) 25 MCG (1000 UT) CAPS Take 3 capsules by mouth daily.     Denosumab (XGEVA [place]) Inject into the skin every 30 (thirty) days.     dexamethasone (DECADRON) 4 MG tablet Take 1 tablet (4 mg total) by mouth 2 (two) times daily with a meal. 20 tablet 0   Goserelin Acetate (ZOLADEX [place]) Inject into the skin every 30 (thirty) days.     levothyroxine (SYNTHROID) 112 MCG tablet 1 tablet in  the morning on an empty stomach     ondansetron (ZOFRAN) 8 MG tablet Take 1 tablet (8 mg total) by mouth 3 (three) times daily as needed for nausea or vomiting. 18 tablet 1   valACYclovir (VALTREX) 500 MG tablet Take 1 tablet (500 mg total) by mouth daily. (Patient not taking: Reported on 01/08/2021) 90 tablet 4   No current facility-administered medications for this visit.    PHYSICAL EXAMINATION: ECOG PERFORMANCE STATUS: 2 - Symptomatic, <50% confined to bed  Vitals:   01/28/21 1404  BP: 111/77  Pulse: 92  Resp: 18  Temp: 98.4 F (36.9 C)  SpO2: 98%   Wt Readings from Last 3 Encounters:  01/28/21 183 lb 2 oz (83.1 kg)  01/22/21 185 lb 8 oz (84.1 kg)  01/08/21 185 lb 11.2 oz (84.2 kg)     GENERAL:alert, no distress and comfortable SKIN: skin color, texture, turgor are normal, no rashes or significant lesions EYES: normal, Conjunctiva are pink and non-injected, (+) sclera yellow  NECK: supple, thyroid normal size, non-tender, without nodularity LYMPH:  no palpable lymphadenopathy in the cervical, axillary  LUNGS: clear to auscultation and percussion with normal breathing effort HEART: regular rate & rhythm and no murmurs and no lower extremity edema ABDOMEN:abdomen soft, non-tender and normal bowel sounds Musculoskeletal:no cyanosis of digits and no clubbing  NEURO: alert & oriented x 3 with fluent speech, no focal motor/sensory deficits  LABORATORY DATA:  I have reviewed the data as listed CBC Latest Ref Rng & Units 01/28/2021 01/21/2021 01/21/2021  WBC 4.0 - 10.5 K/uL 6.2 9.2 9.5  Hemoglobin 12.0 - 15.0 g/dL 9.7(L) 11.8(L) 11.5(L)  Hematocrit 36.0 - 46.0 % 30.2(L) 37.6 35.2(L)  Platelets 150 - 400 K/uL 133(L) 97(L) 95(L)     CMP Latest Ref Rng & Units 01/28/2021 01/21/2021 01/21/2021  Glucose 70 - 99 mg/dL 151(H) 122(H) 122(H)  BUN 6 - 20 mg/dL 8 12 12   Creatinine 0.44 - 1.00 mg/dL 0.73 0.76 0.80  Sodium 135 - 145 mmol/L 137 139 139  Potassium 3.5 - 5.1 mmol/L 3.6 4.2  4.2  Chloride 98 - 111 mmol/L 105 108 108  CO2 22 - 32 mmol/L 21(L) 21(L) 22  Calcium 8.9 - 10.3 mg/dL 8.3(L) 8.3(L) 8.5(L)  Total Protein 6.5 - 8.1 g/dL 7.0 6.7 6.8  Total Bilirubin 0.3 - 1.2 mg/dL 6.1(HH) 6.6(H) 6.9(HH)  Alkaline Phos 38 - 126 U/L 320(H) 287(H) 329(H)  AST 15 - 41 U/L 201(HH) 179(H) 181(HH)  ALT 0 - 44 U/L 93(H) 122(H) 133(H)      RADIOGRAPHIC STUDIES: I have personally reviewed the radiological images as listed and agreed with the findings in the report. No results found.    Orders Placed This Encounter  Procedures   US Abdomen Limited    Rule out biliary obstruction    Standing Status:   Future    Standing Expiration Date:   01/28/2022  Order Specific Question:   Reason for Exam (SYMPTOM  OR DIAGNOSIS REQUIRED)    Answer:   evaluate liver and bile duct    Order Specific Question:   Preferred imaging location?    Answer:   [facility]   All questions were answered. The patient knows to call the clinic with any problems, questions or concerns. No barriers to learning was detected. The total time spent in the appointment was 30 minutes.     Truitt Merle, MD 01/28/2021   I, Wilburn Mylar, am acting as scribe for Truitt Merle, MD.   I have reviewed the above documentation for accuracy and completeness, and I agree with the above.

## 2021-01-29 LAB — HAPTOGLOBIN: Haptoglobin: <10 mg/dL — ABNORMAL LOW (ref 42–296)

## 2021-02-03 ENCOUNTER — Other Ambulatory Visit: Payer: Self-pay | Admitting: Oncology

## 2021-02-03 DIAGNOSIS — D582 Other hemoglobinopathies: Secondary | ICD-10-CM

## 2021-02-03 NOTE — Progress Notes (Unsigned)
[place]  [phone] [phone]     ID: [name] [name] DOB: 1980/07/29  MR#: [id number]  VXB#:[id number]  Patient Care Team: Townsend Roger, MD as PCP - General (Internal Medicine) Derwood Kaplan, MD as PCP - Hematology/Oncology (Oncology) Myria Steenbergen, Virgie Dad, MD as Consulting Physician (Oncology) Jacelyn Pi, MD as Referring Physician (Endocrinology) Armandina Gemma, MD as Consulting Physician (General Surgery) Servando Salina, MD as Consulting Physician (Obstetrics and Gynecology) Raina Mina, RPH-CPP (Pharmacist) Chauncey Cruel, MD OTHER MD:  CHIEF COMPLAINT: metastatic breast cancer, estrogen receptor positive  CURRENT TREATMENT: capecitabine, goserelin, denosumab   INTERVAL HISTORY: [name] returns today for follow up of her metastatic breast cancer.  She is accompanied by her aunt Bonnita Nasuti who is visiting from [place]  Since her last visit, she underwent liver MRI on 01/12/2021 showing: innumerable indistinct small enhancing liver masses throughout the liver replacing much of the liver parenchyma; widespread patchy confluent enhancing lesions throughout the visualized skeleton.  Brain MRI 01/15/2021 showed: 1. Subtle, indeterminate dural thickening and mild nodularity along both frontal convexities. Some of these areas appear stable since 2020 suggesting benign etiology, and there is no associated cerebral edema, distinct overlying skull metastasis (see #2), or intra-axial brain metastasis identified. A short interval repeat Head MRI without and with contrast in 3 months would be helpful to evaluate stability. But in the meantime, CSF analysis could be considered if there is a high suspicion of dural metastases.  2. Diffusely decreased T1 bone marrow signal throughout the skull and visible cervical spine, but no destructive skull lesion. We are following her hemoglobin closely.  She has received multiple blood transfusions  and of course this is a concern because of iron overload.  On 01/20/2021 she was set up for paracentesis since the history we received suggestive ascites.  However there were no significant ascites found and paracentesis was not performed.  She had liver biopsy yesterday which shows metastatic adenocarcinoma consistent with her breast primary.  A repeat prognostic panel is pending and I have also requested an E-cadherin given the pattern of infiltration seen on the biopsy films.  Lab Results  Component Value Date   HGB 9.7 (L) 01/28/2021   HGB 11.8 (L) 01/21/2021   HGB 11.5 (L) 01/21/2021   HGB 8.1 (L) 01/14/2021   HGB 8.0 (L) 01/06/2021   On 01/06/2021 we sent an anemia panel just to make sure there were no other causes of her anemia besides the bone marrow infiltration and of course any treatment effects.  Her ferritin was 4000, iron saturation 102%, folate 14, B12 was 3636.  LDH was elevated at 810.  Reticulocytes were inappropriately low at 42.5, but this does not entirely rule out hemolysis given the bone marrow infiltration.  I added a haptoglobin and DAT to today's labs in addition to additional liver labs.  REVIEW OF SYSTEMS: [name] tells me her eyes have turned yellow.  She stopped working 01/19/2021.  She is too fatigued to work.  She has been mostly staying at home.  She tolerated the liver biopsy with some discomfort but no evidence of bleeding.  She says her appetite is decreased and she has nausea with some vomiting occasionally.  Urine is very dark orange but not brown.  Taste is okay.  Bowel movements are brown.  She is not aware of any bleeding, and has not had any fever.  A detailed review of systems today was otherwise stable.  COVID 19 VACCINATION STATUS: Status post [facility]  x3, third dose October 2021; booster pending   HISTORY OF CURRENT ILLNESS: From the [facility] note dated 04/10/2020 by Dickey Gave, PA-C:   "[name] [name] is a 40 y.o. female  African American female with stage IV (T3 N1 M1) hormone receptor positive right breast cancer diagnosed in December 2020. We began seeing in November 2020 for leukocytosis, anemia and thrombocytopenia.  She had bruising and nose bleeds, as well as a 10-15 lb weight loss.  She had been having chronic back pain for several months, for which she was using ibuprofen or Aleve.  LDH was markedly elevated at 2599, reticulocyte count mildly elevated at 3.4% and haptoglobin was less than 10, which was consistent with hemolysis, but Coombs was negative.  CT chest, abdomen and pelvis revealed diffusely sclerotic osseous structures with multiple lytic lesions noted throughout the axial skeleton.  Some lesions demonstrated expansile soft tissue components, and these findings are highly concerning for multiple myeloma or other osseous metastatic disease of uncertain origin.  A spiculated appearing mass or tissue element was also seen in the lateral right breast, 9 o'clock.  Bone marrow biopsy revealed abundant metastatic carcinoma, which was estrogen receptor positive, so consistent with breast cancer origin.  With that information, it became clear that she had malignant breast cancer that had metastasized to the bone.  She underwent a digital diagnostic bilateral mammogram with right breast ultrasound in December, which revealed highly suspicious calcifications spanning at least 9.2 cm in the upper outer quadrant of the right breast with distortion associated with the posterior calcifications.  A discrete mass was seen in the right breast with ultrasound at 10 o'clock, 8 cm from the nipple measuring 9 x 6 x 7 mm.  Several mildly abnormal nodes are seen in the right axilla with cortices measuring between 3 and 5 mm.  The lymph nodes were symmetric on mammography and were not definitely different compared to 2015.  Stereotactic needle core biopsy of the right breast and right axilla confirmed grade 2, invasive ductal carcinoma,  as well as ductal carcinoma in situ.  The invasive component measured 0.9 cm in greatest linear extent on the core biopsy.  Metastatic carcinoma was involved in one lymph node.  Estrogen receptor positive at 90%, progesterone receptor positive at 1%, and HER2 negative.  Ki67 was 15%.  Nuclear medicine PET scan revealed widespread hypermetabolic bony metastatic disease.  Low level hypermetabolism in bilateral axillary nodes, right greater than left was seen with uptake identified in the ill defined soft tissue lesion in the lateral right breast.  Foci of hypermetabolism identified in the central uterus along the IUD and along the anterior cervix and adjacent small bowel, which were nonspecific.  MRI head did not reveal intracranial metastasis, but diffusely abnormal bone marrow in the skull and cervical spine likely due to metastatic disease was observed.  CA 27-29 was not elevated.   Due to her age of diagnosis, she underwent testing for hereditary breast cancer with the Myriad  myRisk hereditary cancer panel test.  This did not reveal any clinically significant mutation.  There were variants of uncertain significance of the ATM, AXIN2 and MSH3 genes.   She had severer pain, so was placed on methadone, in addition to oxycodone and the doses were titrated up.  We recommended letrozole/palbociclib, denosumab monthly for the bone metastasis and goserelin monthly to suppress ovarian function due to being premenopausal.  She started letrozole on December 28th and denosumab and goserelin on January 7th.  She started palbociclib for 3 weeks on and one week off on January 9th.   She develops severe hypocalcemia, which required IV calcium replacement. Denosumab had to be held due to the severe hypocalcemia, but was resumed in March.  She was placed on high doses of oral calcium.  She had multiple other issues, such as decreased appetite, nausea and vomiting, as well  As dehydration, managed with medications. MRI thoracic  and lumbar spine on January 19th revealed diffusely decreased T1 bone marrow signal with fairly homogeneous enhancement throughout the thoracolumbar spine.  Prior PET-CT demonstrated homogeneous hypermetabolic activity throughout the thoracolumbar spine.  These findings are favored to be related to the patient's anemia rather than diffuse metastatic disease.  There were possible small osseous metastases, notably within the T3 spinous process, left L2 pedicle and upper right sacrum.  There was no significant disc herniation, spinal stenosis or nerve root encroachment.  She was subsequently weaned off methadone and has not had recurrent pain.  She developed cytopenias due to palbociclib and we occasionally had to delay cycles   CT chest, abdomen and pelvis in May revealed diffuse patchy confluent sclerotic osseous metastatic disease throughout the axial and proximal appendicular skeleton, probably representing treatment effect.  ThereWere healed deformities throughout the bilateral ribs.  A new mild right axillary lymphadenopathy was also observed, which wasnonspecific.  No additional sites of metastatic disease were seen in the chest, abdomen or pelvis.  She had a  tubal ligation and removal of her Mirena IUD in June.  CT chest, abdomen and pelvis in September revealed again widespread osseous sclerosis, indicative of treated metastatic disease.  The previous borderline enlarged right axillary lymph nodes had resolved.  She had recurrent cytopenias, and since we had to delay multiple cycles of palbociclib, we decreased her palbociclib dose to 100 mg daily for 3 weeks on a week off."  The patient's subsequent history is as detailed below.   PAST MEDICAL HISTORY: Past Medical History:  Diagnosis Date   Anemia    History of cardiac murmur as a child    History of Graves' disease    dx hyperthyroidism during 4th pregnancy;   11-15-2013  s/p  total thyroidectomy   Hypothyroidism, postsurgical 11/15/2013    endocrinologist--- dr balan   Pre-diabetes    Stage IV breast cancer in female [facility]) 05/2019   oncologist-- dr c. Hinton Rao ([facility]) bone marrow bx 05-08-2019 and right breast bx 05-17-2020--- primary breast w/ mets to bones, pt taking oral chemo (ibrance)  Tumor associated hemolysis at presentation December 2020   PAST SURGICAL HISTORY: Past Surgical History:  Procedure Laterality Date   IUD REMOVAL N/A 11/22/2019   Procedure: INTRAUTERINE DEVICE (IUD) REMOVAL;  Surgeon: Servando Salina, MD;  Location: [facility];  Service: Gynecology;  Laterality: N/A;   LAPAROSCOPIC TUBAL LIGATION Bilateral 11/22/2019   Procedure: LAPAROSCOPIC TUBAL LIGATION By Cautery;  Surgeon: Servando Salina, MD;  Location: [facility];  Service: Gynecology;  Laterality: Bilateral;   THYROIDECTOMY Bilateral 11/15/2013   Procedure: TOTAL THYROIDECTOMY;  Surgeon: Earnstine Regal, MD;  Location: [facility];  Service: General;  Laterality: Bilateral;   WISDOM TOOTH EXTRACTION  2012    FAMILY HISTORY: Family History  Problem Relation Age of Onset   Cancer Mother        Peripheral T cell Lymphoma   Diabetes Father    Diabetes Maternal Grandmother   The patient's mother died at age 27 shortly after her diagnosis of T-cell non-Hodgkin's  lymphoma. The patient's father is 27 years old as of December 2020. He has a history of diabetes, is a bilateral amputee, and has a history of EtOH abuse. The patient has 2 half siblings on her mother side and 3/2 siblings on her father's side. None of them have a history of cancer. She has a maternal cousin with a history of B-cell non-Hodgkin's lymphoma and a maternal grandfather with prostate cancer   GYNECOLOGIC HISTORY:  Patient's last menstrual period was 10/26/2018. Menarche: 40 years old Age at first live birth: 40 years old [place] P 4 LMP December 2020 (when goserelin started Contraceptive: Status post bilateral tubal ligation HRT n/a   Hysterectomy? no BSO? no   SOCIAL HISTORY: (updated 04/2020)  [name] is divorced. Her former husband Audreanna Torrisi is a Armed forces technical officer for the [employer]. 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  She works on Friday Saturdays and Sundays only.  Her daughters Jeanella Cara, and [place], age 107-9 as of December 2021. They stay 1 week with the patient in 1 week with her ex-husband. The patient also has a burn doodle called Karlene Lineman. The patient is a Psychologist, forensic    ADVANCED DIRECTIVES: Not in place. At the 05/06/2020 visit the patient was given the appropriate documents to complete and notarized at her discretion.   HEALTH MAINTENANCE: Social History   Tobacco Use   Smoking status: Never   Smokeless tobacco: Never  Vaping Use   Vaping Use: Never used  Substance Use Topics   Alcohol use: Not Currently    Comment: social   Drug use: Never     Colonoscopy: n/a (age)  PAP: Up-to-date  Bone density: Remote   No Known Allergies  Current Outpatient Medications  Medication Sig Dispense Refill   b complex vitamins capsule Take 1 capsule by mouth daily.     Calcium Carb-Cholecalciferol (CALCIUM 600 + D PO) Take 4 tablets by mouth daily.     capecitabine (XELODA) 500 MG tablet Take 3 tablets (1,500 mg total) by mouth 2 (two) times daily after a meal. Take on days 1-14. Repeat every 21 days. 84 tablet 4   capecitabine (XELODA) 500 MG tablet Take 3 tablets (1,500 mg total) by mouth 2 (two) times daily after a meal. Start 01/22/2021 with supper; continue for 14 days 30 tablet 0   Cholecalciferol (VITAMIN D3) 25 MCG (1000 UT) CAPS Take 3 capsules by mouth daily.     Denosumab (XGEVA Francis) Inject into the skin every 30 (thirty) days.     dexamethasone (DECADRON) 4 MG tablet Take 1 tablet (4 mg total) by mouth 2 (two) times daily with a meal. 20 tablet 0   Goserelin Acetate (ZOLADEX Sawyer) Inject into the skin every 30 (thirty) days.     levothyroxine (SYNTHROID) 112 MCG tablet 1 tablet in  the morning on an empty stomach     ondansetron (ZOFRAN) 8 MG tablet Take 1 tablet (8 mg total) by mouth 3 (three) times daily as needed for nausea or vomiting. 18 tablet 1   prochlorperazine (COMPAZINE) 10 MG tablet Take 1 tablet (10 mg total) by mouth every 6 (six) hours as needed for nausea or vomiting. 30 tablet 0   valACYclovir (VALTREX) 500 MG tablet Take 1 tablet (500 mg total) by mouth daily. (Patient not taking: Reported on 01/08/2021) 90 tablet 4   No current facility-administered medications for this visit.    OBJECTIVE: African-American woman who appears stated age  There were no vitals filed for  this visit.     There is no height or weight on file to calculate BMI.   Wt Readings from Last 3 Encounters:  01/28/21 183 lb 2 oz (83.1 kg)  01/22/21 185 lb 8 oz (84.1 kg)  01/08/21 185 lb 11.2 oz (84.2 kg)     ECOG FS:1 - Symptomatic but completely ambulatory  Sclerae mildly icteric, EOMs intact Wearing a mask Lungs no rales or rhonchi Heart regular rate and rhythm Abd soft, nontender including over the right upper quadrant, positive bowel sounds Neuro: nonfocal, well oriented x3, at times overwhelmed during today's visit but overall appropriate affect Breasts:    LAB RESULTS:  CMP     Component Value Date/Time   NA 137 01/28/2021 1347   NA 140 04/16/2020 0000   K 3.6 01/28/2021 1347   CL 105 01/28/2021 1347   CO2 21 (L) 01/28/2021 1347   GLUCOSE 151 (H) 01/28/2021 1347   BUN 8 01/28/2021 1347   BUN 18 04/16/2020 0000   CREATININE 0.73 01/28/2021 1347   CREATININE 0.93 11/11/2020 1034   CALCIUM 8.3 (L) 01/28/2021 1347   PROT 7.0 01/28/2021 1347   ALBUMIN 2.5 (L) 01/28/2021 1347   AST 201 (HH) 01/28/2021 1347   AST 38 11/11/2020 1034   ALT 93 (H) 01/28/2021 1347   ALT 33 11/11/2020 1034   ALKPHOS 320 (H) 01/28/2021 1347   BILITOT 6.1 (HH) 01/28/2021 1347   BILITOT 0.3 11/11/2020 1034   GFRNONAA >60 01/28/2021 1347   GFRNONAA >60 11/11/2020 1034   GFRAA >60  04/28/2019 1218    No results found for: TOTALPROTELP, ALBUMINELP, A1GS, A2GS, BETS, BETA2SER, GAMS, MSPIKE, SPEI  Lab Results  Component Value Date   WBC 6.2 01/28/2021   NEUTROABS 3.3 01/28/2021   HGB 9.7 (L) 01/28/2021   HCT 30.2 (L) 01/28/2021   MCV 90.1 01/28/2021   PLT 133 (L) 01/28/2021    No results found for: LABCA2  No components found for: ZHGDJM426  Recent Labs  Lab 01/28/21 1347  INR 1.2    No results found for: LABCA2  No results found for: STM196  No results found for: QIW979  No results found for: GXQ119  Lab Results  Component Value Date   CA2729 16.2 11/27/2020    No components found for: HGQUANT  No results found for: CEA1 / No results found for: CEA1   No results found for: AFPTUMOR  No results found for: CHROMOGRNA  No results found for: KPAFRELGTCHN, LAMBDASER, KAPLAMBRATIO (kappa/lambda light chains)  No results found for: HGBA, HGBA2QUANT, HGBFQUANT, HGBSQUAN (Hemoglobinopathy evaluation)   Lab Results  Component Value Date   LDH 1,846 (H) 01/28/2021    Lab Results  Component Value Date   IRON 176 (H) 01/14/2021   TIBC 176 (L) 01/14/2021   IRONPCTSAT 100 (H) 01/14/2021   (Iron and TIBC)  Lab Results  Component Value Date   FERRITIN 2,994 (H) 01/14/2021    Urinalysis    Component Value Date/Time   COLORURINE YELLOW 04/28/2019 1223   APPEARANCEUR HAZY (A) 04/28/2019 1223   LABSPEC 1.024 04/28/2019 1223   PHURINE 5.0 04/28/2019 1223   GLUCOSEU NEGATIVE 04/28/2019 [place] 04/28/2019 [place] 04/28/2019 [place] 04/28/2019 [place] 04/28/2019 1223   NITRITE NEGATIVE 04/28/2019 [place] 04/28/2019 1223    STUDIES: MR Brain W Wo Contrast  Result Date: 01/19/2021 CLINICAL DATA:  40 year old female with metastatic breast cancer. Staging.  EXAM: MRI HEAD WITHOUT AND WITH CONTRAST TECHNIQUE: Multiplanar, multiecho pulse sequences  of the brain and surrounding structures were obtained without and with intravenous contrast. CONTRAST:  41mL GADAVIST GADOBUTROL 1 MMOL/ML IV SOLN COMPARISON:  [facility] brain MRI and PET-CT 05/21/2019 FINDINGS: Brain: There is a mildly undulating appearance of the enhancing packing meninges along the frontal convexities, more so on the right (series 16, image 88). And 1 of these rounded areas of dural thickening (that seen on series 16, image 101) is stable since 2020 and appears benign. However, a larger 10 mm area along the right inferior frontal gyrus on series 16, image 81 is new but only 3 mm thick. This lesion is visible on the coronal postcontrast series 19, image 6 (the more inferior and lateral) where similar superior dural nodularity appears fairly stable since the 2020 postcontrast coronal. Furthermore, contralateral left frontal convexity mild dural thickening and nodularity (series 16, image 99) also appears similar 2020. The bilateral dural changes are also visible on axial FLAIR (series 11, images 32 and 35). No abnormal intra-axial enhancement (vascular related enhancement suspected in the left basal ganglia on series 16, image 74). No cerebral edema. No restricted diffusion to suggest acute infarction. No midline shift, mass effect, ventriculomegaly, extra-axial collection or acute intracranial hemorrhage. Cervicomedullary junction and pituitary are within normal limits. Scattered small mostly subcortical white matter T2 and FLAIR hyperintensity in both hemispheres has mildly increased since 2020, now mild to moderate for age. This is in a nonspecific configuration. No chronic cerebral blood products. No cortical encephalomalacia. No restricted diffusion to suggest acute infarction. No midline shift, mass effect, evidence of mass lesion, ventriculomegaly, extra-axial collection or acute intracranial hemorrhage. Cervicomedullary junction and pituitary are within normal limits. Vascular: Major  intracranial vascular flow voids appear stable. The major dural venous sinuses are enhancing and appear to be patent. Skull and upper cervical spine: Generalized decreased bone marrow signal throughout the skull and cervical spine was present in 2020, with no destructive or discrete suspicious bone lesion identified. Sinuses/Orbits: Stable, negative. Other: Mastoids are clear. Visible internal auditory structures appear normal. Visible scalp and face appear negative. IMPRESSION: 1. Subtle, indeterminate dural thickening and mild nodularity along both frontal convexities. Some of these areas appear stable since 2020 suggesting benign etiology, and there is no associated cerebral edema, distinct overlying skull metastasis (see #2), or intra-axial brain metastasis identified. A short interval repeat Head MRI without and with contrast in 3 months would be helpful to evaluate stability. But in the meantime, CSF analysis could be considered if there is a high suspicion of dural metastases. 2. Diffusely decreased T1 bone marrow signal throughout the skull and visible cervical spine, but no destructive skull lesion. Electronically Signed   By: Genevie Ann M.D.   On: 01/19/2021 06:24   MR LIVER W WO CONTRAST  Result Date: 01/13/2021 CLINICAL DATA:  Metastatic breast cancer. Evaluate possible liver metastasis. EXAM: MRI ABDOMEN WITHOUT AND WITH CONTRAST TECHNIQUE: Multiplanar multisequence MR imaging of the abdomen was performed both before and after the administration of intravenous contrast. CONTRAST:  52mL GADAVIST GADOBUTROL 1 MMOL/ML IV SOLN COMPARISON:  02/13/2020 CT chest, abdomen and pelvis. FINDINGS: Lower chest: No acute abnormality at the lung bases. Hepatobiliary: Mild hepatomegaly. No hepatic steatosis. Innumerable indistinct small enhancing mildly T2 hyperintense liver masses throughout the liver replacing much of the liver parenchyma compatible with metastatic disease. Representative anterior segment 3 left  liver 1.2 x 0.9 cm mass (series 6/image 17), posterior segment 31.4 x  1.0 cm mass (series 6/image 16) and segment 6 right liver 1.0 x 0.8 cm mass (series 6/image 22). Simple 1.0 cm segment 2 left liver cyst. Contracted gallbladder with mild diffuse gallbladder wall thickening. No cholelithiasis. No pericholecystic fluid. No biliary ductal dilatation. Common bile duct diameter 2 mm. No choledocholithiasis. No biliary masses, strictures or beading. Pancreas: No pancreatic mass or duct dilation.  No pancreas divisum. Spleen: Normal size. No mass. Adrenals/Urinary Tract: Normal adrenals. No hydronephrosis. Normal kidneys with no renal mass. Stomach/Bowel: Normal non-distended stomach. Visualized small and large bowel is normal caliber, with no bowel wall thickening. Vascular/Lymphatic: Normal caliber abdominal aorta. Patent portal, splenic, hepatic and renal veins. No pathologically enlarged lymph nodes in the abdomen. Other: No abdominal ascites or focal fluid collection. Musculoskeletal: Widespread patchy confluent enhancing lesions throughout the visualized skeleton compatible with bone metastases. IMPRESSION: 1. Innumerable indistinct small enhancing liver masses throughout the liver replacing much of the liver parenchyma compatible with metastatic disease. Mild hepatomegaly. 2. Widespread patchy confluent enhancing lesions throughout the visualized skeleton compatible with bone metastases. 3. Contracted gallbladder with nonspecific mild diffuse gallbladder wall thickening. No cholelithiasis. No biliary ductal dilatation. No choledocholithiasis. Electronically Signed   By: Ilona Sorrel M.D.   On: 01/13/2021 10:24   [place] ASCITES (ABDOMEN LIMITED)  Result Date: 01/20/2021 CLINICAL DATA:  Metastatic breast cancer Ascites EXAM: LIMITED ABDOMEN ULTRASOUND FOR ASCITES TECHNIQUE: Limited ultrasound survey for ascites was performed in all four abdominal quadrants. COMPARISON:  None. FINDINGS: Trace mid lower abdominal  ascites. IMPRESSION: Trace mid lower abdominal ascites. Electronically Signed   By: Miachel Roux M.D.   On: 01/20/2021 16:40   [place] CORE BIOPSY (LIVER)  Result Date: 01/21/2021 INDICATION: 40 year old with breast cancer and evidence for innumerable hepatic lesions. Tissue diagnosis is needed. EXAM: Ultrasound-guided liver biopsy MEDICATIONS: Moderate sedation ANESTHESIA/SEDATION: Moderate (conscious) sedation was employed during this procedure. A total of Versed 2.0 mg and Fentanyl 100 mcg was administered intravenously. Moderate Sedation Time: 15 minutes. The patient's level of consciousness and vital signs were monitored continuously by radiology nursing throughout the procedure under my direct supervision. FLUOROSCOPY TIME:  Fluoroscopy Time: None COMPLICATIONS: None immediate. PROCEDURE: Informed written consent was obtained from the patient after a thorough discussion of the procedural risks, benefits and alternatives. All questions were addressed. A timeout was performed prior to the initiation of the procedure. Liver was thoroughly evaluated with ultrasound. The liver is diffusely heterogeneous without discrete lesions. Based on the previous MRI, findings are compatible with infiltrative disease in the liver. As a result, the inferior right hepatic lobe was selected due to easy accessibility and heterogeneity in this area. The right side of the abdomen was prepped with chlorhexidine and sterile field was created. Maximal barrier sterile technique was utilized including caps, mask, sterile gowns, sterile gloves, sterile drape, hand hygiene and skin antiseptic. Skin was anesthetized using 1% lidocaine. Small incision was made. Using ultrasound guidance, 17 gauge coaxial needle was directed into the right hepatic lobe. Total of 3 core biopsies were obtained with an 18 gauge core device. Specimens placed in formalin. Gel-Foam slurry was injected through the 17 gauge needle as it was removed. Bandage placed over  the puncture site. FINDINGS: Liver is diffusely heterogeneous without discrete lesions. As a result, heterogeneous area in the right hepatic lobe was targeted for biopsy. Three adequate specimens obtained. No immediate bleeding or hematoma formation. IMPRESSION: Ultrasound-guided core biopsies from the right hepatic lobe. Electronically Signed   By: Markus Daft M.D.   On:  01/21/2021 14:27     ELIGIBLE FOR AVAILABLE RESEARCH PROTOCOL: no  ASSESSMENT: 40 y.o. [place] woman presenting NOV 2021 with stage IV breast cancer as follows:  (a) workup of initial pancytopenia and hemolysis showed a leukoerythroblastic blood picture with bone marrow biopsy 05/07/2020 confirming metastatic carcinoma, estrogen receptor positive; cytogenetics were normal  (b) CT scans at baseline showing multiple bone lesions (both sclerotic and lytic) and a possible mass in the right breast; no definitive lung or liver involvement  (c) right breast upper outer quadrant biopsy x2 and right axillary lymph node biopsy on 05/18/2019 confirmed a clinical 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, grade 1-2, estrogen receptor strongly positive, progesterone receptor moderately positive (1%), HER-2 negative, with an MIB-1 of 2-15%  (d) CA 27-29 was not informative (repeat 05/06/2020 WNL)  (1) letrozole started 05/28/2019  (a) goserelin started 06/07/2019, repeated every 4 weeks  (b) palbociclib added 06/09/2019  (c) palbociclib dose decreased to 100 mg daily, 21/7, with September 2021 cycle  (d) letrozole and palbociclib discontinued 01/15/2021, with evidence of disease progression  (e) goserelin continued  (2) denosumab/Xgeva started 06/07/2019, repeated every 4 weeks  (3) genetics testing through the [facility] [employer] panel dated 09/22/2019 found no deleterious mutations in the genes tested which included BRCA 1 and 2, CHEK2, PALB 2 and TP53 among others.  (a) variants of uncertain significance were found in ATM, AXN 2, and [place]  3.  (4) restaging studies:  (a) chest CT and bone scan 05/21/2020 shows no visceral disease, stable bone lesions  (b) bilateral mammography and right breast ultrasonography 06/25/2020 shows the measurable disease in the right breast to have decreased.  There is some progressive calcifications that require monitoring  (c) bone scan 11/21/2020 shows no new bone lesions  (d) breast ultrasound 11/12/2020 shows the previously noted 10:00 mass to have resolved.  The right axillary adenopathy also has resolved.  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  She is minimally pruritic.  The liver involvement is infiltrative which would be more consistent with a lobular breast cancer and I have requested an eke adherent to confirm although the pathologist tells me on consultation today that the morphology is ductal.  The prognostic panel is pending but I suspect we are going to be dealing with estrogen  and progesterone receptor negative metastases given the lack of response to her earlier treatment.  Accordingly we are going to go with capecitabine.  Thanks to our oral chemotherapy pharmacy specialists we were able to get her enough pills so she could start this evening.  She will  be receiving her first shipment on Monday, 01/18/2028.  She understands how to take these medications and what the possible toxicities side effects and complications are.  I also alerted her to the possibility of fever and abdominal pain which would indicate cholangitis.  If either of those symptoms develop she needs to contact us immediately and if it is after hours to go to the emergency room.  I have scheduled her for return in 1 week the following week but urged her to contact us with any unusual symptoms.  I also gave her a copy of the healthcare power of attorney and urged her to completed and have it notarized in the near future.  Total encounter time 35 minutes.Sarajane Jews C. Deandra Goering, MD 02/03/21 7:12 AM Medical Oncology and Hematology [facility] [place], [place] [address] Tel. [phone]    Fax. [phone]   I, Wilburn Mylar, am acting as scribe for Dr. Virgie Dad. Keturah Yerby.  I, Lurline Del MD, have reviewed the above documentation for accuracy and completeness, and I agree with the above.    *Total Encounter Time as defined by the Centers for Medicare and Medicaid Services includes, in addition to the face-to-face time of a patient visit (documented in the note above) non-face-to-face time: obtaining and reviewing outside history, ordering and reviewing medications, tests or procedures, care coordination (communications with other health care professionals or caregivers) and documentation in the medical record.

## 2021-02-04 ENCOUNTER — Encounter: Payer: Self-pay | Admitting: Oncology

## 2021-02-04 ENCOUNTER — Inpatient Hospital Stay: Payer: Commercial Managed Care - PPO | Attending: Oncology

## 2021-02-04 ENCOUNTER — Inpatient Hospital Stay (HOSPITAL_BASED_OUTPATIENT_CLINIC_OR_DEPARTMENT_OTHER): Payer: Commercial Managed Care - PPO | Admitting: Oncology

## 2021-02-04 ENCOUNTER — Other Ambulatory Visit: Payer: Self-pay

## 2021-02-04 ENCOUNTER — Telehealth: Payer: Self-pay | Admitting: *Deleted

## 2021-02-04 VITALS — BP 98/65 | HR 80 | Temp 97.7°F | Resp 18 | Ht 65.0 in | Wt 177.8 lb

## 2021-02-04 DIAGNOSIS — C7951 Secondary malignant neoplasm of bone: Secondary | ICD-10-CM

## 2021-02-04 DIAGNOSIS — C787 Secondary malignant neoplasm of liver and intrahepatic bile duct: Secondary | ICD-10-CM

## 2021-02-04 DIAGNOSIS — D649 Anemia, unspecified: Secondary | ICD-10-CM | POA: Diagnosis not present

## 2021-02-04 DIAGNOSIS — C50811 Malignant neoplasm of overlapping sites of right female breast: Secondary | ICD-10-CM

## 2021-02-04 DIAGNOSIS — C50411 Malignant neoplasm of upper-outer quadrant of right female breast: Secondary | ICD-10-CM

## 2021-02-04 DIAGNOSIS — C50911 Malignant neoplasm of unspecified site of right female breast: Secondary | ICD-10-CM | POA: Diagnosis present

## 2021-02-04 DIAGNOSIS — Z170 Estrogen receptor positive status [ER+]: Secondary | ICD-10-CM

## 2021-02-04 DIAGNOSIS — D5919 Other autoimmune hemolytic anemia: Secondary | ICD-10-CM

## 2021-02-04 DIAGNOSIS — E0500 Thyrotoxicosis with diffuse goiter without thyrotoxic crisis or storm: Secondary | ICD-10-CM

## 2021-02-04 DIAGNOSIS — D582 Other hemoglobinopathies: Secondary | ICD-10-CM

## 2021-02-04 LAB — COMPREHENSIVE METABOLIC PANEL
ALT: 79 U/L — ABNORMAL HIGH (ref 0–44)
AST: 156 U/L — ABNORMAL HIGH (ref 15–41)
Albumin: 2.7 g/dL — ABNORMAL LOW (ref 3.5–5.0)
Alkaline Phosphatase: 310 U/L — ABNORMAL HIGH (ref 38–126)
Anion gap: 12 (ref 5–15)
BUN: 14 mg/dL (ref 6–20)
CO2: 24 mmol/L (ref 22–32)
Calcium: 8.5 mg/dL — ABNORMAL LOW (ref 8.9–10.3)
Chloride: 100 mmol/L (ref 98–111)
Creatinine, Ser: 0.86 mg/dL (ref 0.44–1.00)
GFR, Estimated: >60 mL/min (ref >60)
Glucose, Bld: 201 mg/dL — ABNORMAL HIGH (ref 70–99)
Potassium: 3.1 mmol/L — ABNORMAL LOW (ref 3.5–5.1)
Sodium: 136 mmol/L (ref 135–145)
Total Bilirubin: 4.3 mg/dL (ref 0.3–1.2)
Total Protein: 6.7 g/dL (ref 6.5–8.1)

## 2021-02-04 LAB — CBC WITH DIFFERENTIAL/PLATELET
Abs Immature Granulocytes: 0.08 10*3/uL — ABNORMAL HIGH (ref 0.00–0.07)
Basophils Absolute: 0 10*3/uL (ref 0.0–0.1)
Basophils Relative: 0 %
Eosinophils Absolute: 0 10*3/uL (ref 0.0–0.5)
Eosinophils Relative: 0 %
HCT: 25.4 % — ABNORMAL LOW (ref 36.0–46.0)
Hemoglobin: 8.3 g/dL — ABNORMAL LOW (ref 12.0–15.0)
Immature Granulocytes: 2 %
Lymphocytes Relative: 41 %
Lymphs Abs: 2 10*3/uL (ref 0.7–4.0)
MCH: 29.3 pg (ref 26.0–34.0)
MCHC: 32.7 g/dL (ref 30.0–36.0)
MCV: 89.8 fL (ref 80.0–100.0)
Monocytes Absolute: 0.4 10*3/uL (ref 0.1–1.0)
Monocytes Relative: 8 %
Neutro Abs: 2.3 10*3/uL (ref 1.7–7.7)
Neutrophils Relative %: 49 %
Platelets: 159 10*3/uL (ref 150–400)
RBC: 2.83 MIL/uL — ABNORMAL LOW (ref 3.87–5.11)
RDW: 23.2 % — ABNORMAL HIGH (ref 11.5–15.5)
WBC: 4.8 10*3/uL (ref 4.0–10.5)
nRBC: 5.9 % — ABNORMAL HIGH (ref 0.0–0.2)

## 2021-02-04 LAB — TYPE AND SCREEN
ABO/RH(D): A POS
Antibody Screen: NEGATIVE

## 2021-02-04 LAB — SAVE SMEAR(SSMR), FOR PROVIDER SLIDE REVIEW

## 2021-02-04 LAB — RETICULOCYTES
Immature Retic Fract: 23.8 % — ABNORMAL HIGH (ref 2.3–15.9)
RBC.: 2.79 MIL/uL — ABNORMAL LOW (ref 3.87–5.11)
Retic Count, Absolute: 59.7 10*3/uL (ref 19.0–186.0)
Retic Ct Pct: 2.1 % (ref 0.4–3.1)

## 2021-02-04 MED ORDER — PREDNISONE 20 MG PO TABS: 40.0000 mg | ORAL_TABLET | Freq: Every day | ORAL | 0 refills | Status: DC

## 2021-02-04 MED ORDER — POTASSIUM CHLORIDE CRYS ER 10 MEQ PO TBCR: 10.0000 meq | EXTENDED_RELEASE_TABLET | Freq: Every day | ORAL | 3 refills | Status: DC

## 2021-02-04 MED ORDER — OMEPRAZOLE 40 MG PO CPDR: 40.0000 mg | DELAYED_RELEASE_CAPSULE | Freq: Every day | ORAL | 1 refills | Status: DC

## 2021-02-04 NOTE — Telephone Encounter (Signed)
CRITICAL VALUE STICKER  CRITICAL VALUE: Total Bilirubin 4.3  RECEIVER (on-site recipient of call): Dow Adolph, [place] NOTIFIED: 02/04/21 4:56 PM   MESSENGER (representative from lab): Orvis Brill  MD NOTIFIED: [place]  TIME OF NOTIFICATION: 4:57 PM  RESPONSE:

## 2021-02-04 NOTE — Progress Notes (Signed)
[place]  [phone] [phone]     ID: [name] [name] DOB: [birth year]  MR#: [id number]  OYD#:[id number]  Patient Care Team: Townsend Roger, MD as PCP - General (Internal Medicine) Derwood Kaplan, MD as PCP - Hematology/Oncology (Oncology) Agam Davenport, Virgie Dad, MD as Consulting Physician (Oncology) Jacelyn Pi, MD as Referring Physician (Endocrinology) Armandina Gemma, MD as Consulting Physician (General Surgery) Servando Salina, MD as Consulting Physician (Obstetrics and Gynecology) Raina Mina, RPH-CPP (Pharmacist) Chauncey Cruel, MD OTHER MD:  CHIEF COMPLAINT: metastatic breast cancer, estrogen receptor positive  CURRENT TREATMENT: capecitabine, goserelin, denosumab   INTERVAL HISTORY: [name] returns today for follow up of her metastatic breast cancer.    She was switched to Xeloda on 01/21/2021, 14 days on and 7 days off.  She completes the first cycle today.  She is tolerating it well and specifically has had no problems with diarrhea mouth sores or rash.  She is scheduled for follow up abdomen ultrasound on 02/09/2021.  The plan just to see if there is a focal obstruction that might possibly be stented  REVIEW OF SYSTEMS: [name] tells me she actually has been feeling better the last few days and particularly today she feels better.  Initially she had a lot of nausea vomiting and no appetite and lost quite a bit of weight, about 25pounds, but now she is eating better and her appetite is better and she went back to work to 12-hour days.  Her eyes are still yellow.  She has had a little bit of a headache here and there and little bit of a balance issue sometimes but there have been no falls.  A detailed review of systems today was otherwise stable  COVID 19 VACCINATION STATUS: Status post Moderna x3, third dose October 2021; booster pending   HISTORY OF CURRENT ILLNESS: From the [place] note dated  04/10/2020 by Dickey Gave, PA-C:   "[name] [name] is a 40 y.o. female African American female with stage IV (T3 N1 M1) hormone receptor positive right breast cancer diagnosed in December 2020. We began seeing in November 2020 for leukocytosis, anemia and thrombocytopenia.  She had bruising and nose bleeds, as well as a 10-15 lb weight loss.  She had been having chronic back pain for several months, for which she was using ibuprofen or Aleve.  LDH was markedly elevated at 2599, reticulocyte count mildly elevated at 3.4% and haptoglobin was less than 10, which was consistent with hemolysis, but Coombs was negative.  CT chest, abdomen and pelvis revealed diffusely sclerotic osseous structures with multiple lytic lesions noted throughout the axial skeleton.  Some lesions demonstrated expansile soft tissue components, and these findings are highly concerning for multiple myeloma or other osseous metastatic disease of uncertain origin.  A spiculated appearing mass or tissue element was also seen in the lateral right breast, 9 o'clock.  Bone marrow biopsy revealed abundant metastatic carcinoma, which was estrogen receptor positive, so consistent with breast cancer origin.  With that information, it became clear that she had malignant breast cancer that had metastasized to the bone.  She underwent a digital diagnostic bilateral mammogram with right breast ultrasound in December, which revealed highly suspicious calcifications spanning at least 9.2 cm in the upper outer quadrant of the right breast with distortion associated with the posterior calcifications.  A discrete mass was seen in the right breast with ultrasound at 10 o'clock, 8 cm from the nipple measuring 9 x 6  x 7 mm.  Several mildly abnormal nodes are seen in the right axilla with cortices measuring between 3 and 5 mm.  The lymph nodes were symmetric on mammography and were not definitely different compared to 2015.  Stereotactic needle core biopsy of the right  breast and right axilla confirmed grade 2, invasive ductal carcinoma, as well as ductal carcinoma in situ.  The invasive component measured 0.9 cm in greatest linear extent on the core biopsy.  Metastatic carcinoma was involved in one lymph node.  Estrogen receptor positive at 90%, progesterone receptor positive at 1%, and HER2 negative.  Ki67 was 15%.  Nuclear medicine PET scan revealed widespread hypermetabolic bony metastatic disease.  Low level hypermetabolism in bilateral axillary nodes, right greater than left was seen with uptake identified in the ill defined soft tissue lesion in the lateral right breast.  Foci of hypermetabolism identified in the central uterus along the IUD and along the anterior cervix and adjacent small bowel, which were nonspecific.  MRI head did not reveal intracranial metastasis, but diffusely abnormal bone marrow in the skull and cervical spine likely due to metastatic disease was observed.  CA 27-29 was not elevated.   Due to her age of diagnosis, she underwent testing for hereditary breast cancer with the Myriad  myRisk hereditary cancer panel test.  This did not reveal any clinically significant mutation.  There were variants of uncertain significance of the ATM, AXIN2 and MSH3 genes.   She had severer pain, so was placed on methadone, in addition to oxycodone and the doses were titrated up.  We recommended letrozole/palbociclib, denosumab monthly for the bone metastasis and goserelin monthly to suppress ovarian function due to being premenopausal.  She started letrozole on December 28th and denosumab and goserelin on January 7th.  She started palbociclib for 3 weeks on and one week off on January 9th.   She develops severe hypocalcemia, which required IV calcium replacement. Denosumab had to be held due to the severe hypocalcemia, but was resumed in March.  She was placed on high doses of oral calcium.  She had multiple other issues, such as decreased appetite, nausea and  vomiting, as well  As dehydration, managed with medications. MRI thoracic and lumbar spine on January 19th revealed diffusely decreased T1 bone marrow signal with fairly homogeneous enhancement throughout the thoracolumbar spine.  Prior PET-CT demonstrated homogeneous hypermetabolic activity throughout the thoracolumbar spine.  These findings are favored to be related to the patient's anemia rather than diffuse metastatic disease.  There were possible small osseous metastases, notably within the T3 spinous process, left L2 pedicle and upper right sacrum.  There was no significant disc herniation, spinal stenosis or nerve root encroachment.  She was subsequently weaned off methadone and has not had recurrent pain.  She developed cytopenias due to palbociclib and we occasionally had to delay cycles   CT chest, abdomen and pelvis in May revealed diffuse patchy confluent sclerotic osseous metastatic disease throughout the axial and proximal appendicular skeleton, probably representing treatment effect.  ThereWere healed deformities throughout the bilateral ribs.  A new mild right axillary lymphadenopathy was also observed, which wasnonspecific.  No additional sites of metastatic disease were seen in the chest, abdomen or pelvis.  She had a  tubal ligation and removal of her Mirena IUD in June.  CT chest, abdomen and pelvis in September revealed again widespread osseous sclerosis, indicative of treated metastatic disease.  The previous borderline enlarged right axillary lymph nodes had resolved.  She had recurrent  cytopenias, and since we had to delay multiple cycles of palbociclib, we decreased her palbociclib dose to 100 mg daily for 3 weeks on a week off."  The patient's subsequent history is as detailed below.   PAST MEDICAL HISTORY: Past Medical History:  Diagnosis Date   Anemia    History of cardiac murmur as a child    History of Graves' disease    dx hyperthyroidism during 4th pregnancy;   11-15-2013   s/p  total thyroidectomy   Hypothyroidism, postsurgical 11/15/2013   endocrinologist--- dr balan   Pre-diabetes    Stage IV breast cancer in female [facility]) 05/2019   oncologist-- dr c. Hinton Rao ([facility]) bone marrow bx 05-08-2019 and right breast bx 05-17-2020--- primary breast w/ mets to bones, pt taking oral chemo (ibrance)  Tumor associated hemolysis at presentation December 2020   PAST SURGICAL HISTORY: Past Surgical History:  Procedure Laterality Date   IUD REMOVAL N/A 11/22/2019   Procedure: INTRAUTERINE DEVICE (IUD) REMOVAL;  Surgeon: Servando Salina, MD;  Location: [facility];  Service: Gynecology;  Laterality: N/A;   LAPAROSCOPIC TUBAL LIGATION Bilateral 11/22/2019   Procedure: LAPAROSCOPIC TUBAL LIGATION By Cautery;  Surgeon: Servando Salina, MD;  Location: [facility];  Service: Gynecology;  Laterality: Bilateral;   THYROIDECTOMY Bilateral 11/15/2013   Procedure: TOTAL THYROIDECTOMY;  Surgeon: Earnstine Regal, MD;  Location: [facility];  Service: General;  Laterality: Bilateral;   WISDOM TOOTH EXTRACTION  2012    FAMILY HISTORY: Family History  Problem Relation Age of Onset   Cancer Mother        Peripheral T cell Lymphoma   Diabetes Father    Diabetes Maternal Grandmother   The patient's mother died at age 5 shortly after her diagnosis of T-cell non-Hodgkin's lymphoma. The patient's father is 37 years old as of December 2020. He has a history of diabetes, is a bilateral amputee, and has a history of EtOH abuse. The patient has 2 half siblings on her mother side and 3/2 siblings on her father's side. None of them have a history of cancer. She has a maternal cousin with a history of B-cell non-Hodgkin's lymphoma and a maternal grandfather with prostate cancer   GYNECOLOGIC HISTORY:  Patient's last menstrual period was 10/26/2018. Menarche: 40 years old Age at first live birth: 40 years old [place] P 4 LMP December 2020 (when  goserelin started Contraceptive: Status post bilateral tubal ligation HRT n/a  Hysterectomy? no BSO? no   SOCIAL HISTORY: (updated 04/2020)  [name] is divorced. Her former husband Radiah Lubinski is a Armed forces technical officer for the [employer]. 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  She works on Friday Saturdays and Sundays only.  Her daughters Jeanella Cara, and [place], age 42-9 as of December 2021. They stay 1 week with the patient in 1 week with her ex-husband. The patient also has a burn doodle called Karlene Lineman. The patient is a Psychologist, forensic    ADVANCED DIRECTIVES: Not in place. At the 05/06/2020 visit the patient was given the appropriate documents to complete and notarized at her discretion.   HEALTH MAINTENANCE: Social History   Tobacco Use   Smoking status: Never   Smokeless tobacco: Never  Vaping Use   Vaping Use: Never used  Substance Use Topics   Alcohol use: Not Currently    Comment: social   Drug use: Never     Colonoscopy: n/a (age)  PAP: Up-to-date  Bone density: Remote   No Known Allergies  Current Outpatient Medications  Medication Sig Dispense Refill   omeprazole (PRILOSEC) 40 MG capsule Take 1 capsule (40 mg total) by mouth at bedtime. 30 capsule 1   potassium chloride (KLOR-CON) 10 MEQ tablet Take 1 tablet (10 mEq total) by mouth daily. 69 tablet 3   predniSONE (DELTASONE) 20 MG tablet Take 2 tablets (40 mg total) by mouth daily with breakfast. 20 tablet 0   b complex vitamins capsule Take 1 capsule by mouth daily.     Calcium Carb-Cholecalciferol (CALCIUM 600 + D PO) Take 4 tablets by mouth daily.     capecitabine (XELODA) 500 MG tablet Take 3 tablets (1,500 mg total) by mouth 2 (two) times daily after a meal. Take on days 1-14. Repeat every 21 days. 84 tablet 4   capecitabine (XELODA) 500 MG tablet Take 3 tablets (1,500 mg total) by mouth 2 (two) times daily after a meal. Start 01/22/2021 with supper; continue for 14 days 30 tablet 0   Cholecalciferol  (VITAMIN D3) 25 MCG (1000 UT) CAPS Take 3 capsules by mouth daily.     Denosumab (XGEVA [place]) Inject into the skin every 30 (thirty) days.     dexamethasone (DECADRON) 4 MG tablet Take 1 tablet (4 mg total) by mouth 2 (two) times daily with a meal. 20 tablet 0   Goserelin Acetate (ZOLADEX [place]) Inject into the skin every 30 (thirty) days.     levothyroxine (SYNTHROID) 112 MCG tablet 1 tablet in the morning on an empty stomach     ondansetron (ZOFRAN) 8 MG tablet Take 1 tablet (8 mg total) by mouth 3 (three) times daily as needed for nausea or vomiting. 18 tablet 1   prochlorperazine (COMPAZINE) 10 MG tablet Take 1 tablet (10 mg total) by mouth every 6 (six) hours as needed for nausea or vomiting. 30 tablet 0   valACYclovir (VALTREX) 500 MG tablet Take 1 tablet (500 mg total) by mouth daily. (Patient not taking: Reported on 01/08/2021) 90 tablet 4   No current facility-administered medications for this visit.    OBJECTIVE: African-American woman who appears stated age  40:   02/04/21 1627  BP: 98/65  Pulse: 80  Resp: 18  Temp: 97.7 F (36.5 C)  SpO2: 99%       Body mass index is 29.59 kg/m.   Wt Readings from Last 3 Encounters:  02/04/21 177 lb 12.8 oz (80.6 kg)  01/28/21 183 lb 2 oz (83.1 kg)  01/22/21 185 lb 8 oz (84.1 kg)     ECOG FS:1 - Symptomatic but completely ambulatory  Sclerae unicteric, EOMs intact Wearing a mask No cervical or supraclavicular adenopathy Lungs no rales or rhonchi Heart regular rate and rhythm Abd soft, nontender, positive bowel sounds MSK no focal spinal tenderness, no upper extremity lymphedema Neuro: nonfocal, well oriented, appropriate affect Breasts: I do not palpate a mass in the right breast or the left breast.  Both axilla are benign.   LAB RESULTS:  CMP     Component Value Date/Time   NA 136 02/04/2021 1620   NA 140 04/16/2020 0000   K 3.1 (L) 02/04/2021 1620   CL 100 02/04/2021 1620   CO2 24 02/04/2021 1620   GLUCOSE 201 (H)  02/04/2021 1620   BUN 14 02/04/2021 1620   BUN 18 04/16/2020 0000   CREATININE 0.86 02/04/2021 1620   CREATININE 0.93 11/11/2020 1034   CALCIUM 8.5 (L) 02/04/2021 1620   PROT 6.7 02/04/2021 1620   ALBUMIN 2.7 (L) 02/04/2021 1620  AST 156 (H) 02/04/2021 1620   AST 38 11/11/2020 1034   ALT 79 (H) 02/04/2021 1620   ALT 33 11/11/2020 1034   ALKPHOS 310 (H) 02/04/2021 1620   BILITOT 4.3 (HH) 02/04/2021 1620   BILITOT 0.3 11/11/2020 1034   GFRNONAA >60 02/04/2021 1620   GFRNONAA >60 11/11/2020 1034   GFRAA >60 04/28/2019 1218    No results found for: TOTALPROTELP, ALBUMINELP, A1GS, A2GS, BETS, BETA2SER, GAMS, MSPIKE, SPEI  Lab Results  Component Value Date   WBC 4.8 02/04/2021   NEUTROABS 2.3 02/04/2021   HGB 8.3 (L) 02/04/2021   HCT 25.4 (L) 02/04/2021   MCV 89.8 02/04/2021   PLT 159 02/04/2021    No results found for: LABCA2  No components found for: [id number]  No results for input(s): INR in the last 168 hours.   No results found for: LABCA2  No results found for: QZE092  No results found for: ZRA076  No results found for: AUQ333  Lab Results  Component Value Date   CA2729 16.2 11/27/2020    No components found for: HGQUANT  No results found for: CEA1 / No results found for: CEA1   No results found for: AFPTUMOR  No results found for: CHROMOGRNA  No results found for: KPAFRELGTCHN, LAMBDASER, KAPLAMBRATIO (kappa/lambda light chains)  No results found for: HGBA, HGBA2QUANT, HGBFQUANT, HGBSQUAN (Hemoglobinopathy evaluation)   Lab Results  Component Value Date   LDH 1,846 (H) 01/28/2021    Lab Results  Component Value Date   IRON 176 (H) 01/14/2021   TIBC 176 (L) 01/14/2021   IRONPCTSAT 100 (H) 01/14/2021   (Iron and TIBC)  Lab Results  Component Value Date   FERRITIN 2,994 (H) 01/14/2021    Urinalysis    Component Value Date/Time   COLORURINE YELLOW 04/28/2019 1223   APPEARANCEUR HAZY (A) 04/28/2019 1223   LABSPEC 1.024 04/28/2019  1223   PHURINE 5.0 04/28/2019 1223   GLUCOSEU NEGATIVE 04/28/2019 1223   HGBUR NEGATIVE 04/28/2019 [place] 04/28/2019 [place] 04/28/2019 [place] 04/28/2019 1223   NITRITE NEGATIVE 04/28/2019 1223   LEUKOCYTESUR NEGATIVE 04/28/2019 1223    STUDIES: MR Brain W Wo Contrast  Result Date: 01/19/2021 CLINICAL DATA:  40 year old female with metastatic breast cancer. Staging. EXAM: MRI HEAD WITHOUT AND WITH CONTRAST TECHNIQUE: Multiplanar, multiecho pulse sequences of the brain and surrounding structures were obtained without and with intravenous contrast. CONTRAST:  47m GADAVIST GADOBUTROL 1 MMOL/ML IV SOLN [facility] FINDINGS: Brain: There is a mildly undulating appearance of the enhancing packing meninges along the frontal convexities, more so on the right (series 16, image 88). And 1 of these rounded areas of dural thickening (that seen on series 16, image 101) is stable since 2020 and appears benign. However, a larger 10 mm area along the right inferior frontal gyrus on series 16, image 81 is new but only 3 mm thick. This lesion is visible on the coronal postcontrast series 19, image 6 (the more inferior and lateral) where similar superior dural nodularity appears fairly stable since the 2020 postcontrast coronal. Furthermore, contralateral left frontal convexity mild dural thickening and nodularity (series 16, image 99) also appears similar 2020. The bilateral dural changes are also visible on axial FLAIR (series 11, images 32 and 35). No abnormal intra-axial enhancement (vascular related enhancement suspected in the left basal ganglia on series 16, image 74). No cerebral edema. No restricted diffusion to suggest  acute infarction. No midline shift, mass effect, ventriculomegaly, extra-axial collection or acute intracranial hemorrhage. Cervicomedullary junction and pituitary are within normal  limits. Scattered small mostly subcortical white matter T2 and FLAIR hyperintensity in both hemispheres has mildly increased since 2020, now mild to moderate for age. This is in a nonspecific configuration. No chronic cerebral blood products. No cortical encephalomalacia. No restricted diffusion to suggest acute infarction. No midline shift, mass effect, evidence of mass lesion, ventriculomegaly, extra-axial collection or acute intracranial hemorrhage. Cervicomedullary junction and pituitary are within normal limits. Vascular: Major intracranial vascular flow voids appear stable. The major dural venous sinuses are enhancing and appear to be patent. Skull and upper cervical spine: Generalized decreased bone marrow signal throughout the skull and cervical spine was present in 2020, with no destructive or discrete suspicious bone lesion identified. Sinuses/Orbits: Stable, negative. Other: Mastoids are clear. Visible internal auditory structures appear normal. Visible scalp and face appear negative. IMPRESSION: 1. Subtle, indeterminate dural thickening and mild nodularity along both frontal convexities. Some of these areas appear stable since 2020 suggesting benign etiology, and there is no associated cerebral edema, distinct overlying skull metastasis (see #2), or intra-axial brain metastasis identified. A short interval repeat Head MRI without and with contrast in 3 months would be helpful to evaluate stability. But in the meantime, CSF analysis could be considered if there is a high suspicion of dural metastases. 2. Diffusely decreased T1 bone marrow signal throughout the skull and visible cervical spine, but no destructive skull lesion. Electronically Signed   By: Genevie Ann M.D.   On: 01/19/2021 06:24   MR LIVER W WO CONTRAST  Result Date: 01/13/2021 CLINICAL DATA:  Metastatic breast cancer. Evaluate possible liver metastasis. EXAM: MRI ABDOMEN WITHOUT AND WITH CONTRAST TECHNIQUE: Multiplanar multisequence MR  imaging of the abdomen was performed both before and after the administration of intravenous contrast. CONTRAST:  29m GADAVIST GADOBUTROL 1 MMOL/ML IV SOLN COMPARISON:  02/13/2020 CT chest, abdomen and pelvis. FINDINGS: Lower chest: No acute abnormality at the lung bases. Hepatobiliary: Mild hepatomegaly. No hepatic steatosis. Innumerable indistinct small enhancing mildly T2 hyperintense liver masses throughout the liver replacing much of the liver parenchyma compatible with metastatic disease. Representative anterior segment 3 left liver 1.2 x 0.9 cm mass (series 6/image 17), posterior segment 31.4 x 1.0 cm mass (series 6/image 16) and segment 6 right liver 1.0 x 0.8 cm mass (series 6/image 22). Simple 1.0 cm segment 2 left liver cyst. Contracted gallbladder with mild diffuse gallbladder wall thickening. No cholelithiasis. No pericholecystic fluid. No biliary ductal dilatation. Common bile duct diameter 2 mm. No choledocholithiasis. No biliary masses, strictures or beading. Pancreas: No pancreatic mass or duct dilation.  No pancreas divisum. Spleen: Normal size. No mass. Adrenals/Urinary Tract: Normal adrenals. No hydronephrosis. Normal kidneys with no renal mass. Stomach/Bowel: Normal non-distended stomach. Visualized small and large bowel is normal caliber, with no bowel wall thickening. Vascular/Lymphatic: Normal caliber abdominal aorta. Patent portal, splenic, hepatic and renal veins. No pathologically enlarged lymph nodes in the abdomen. Other: No abdominal ascites or focal fluid collection. Musculoskeletal: Widespread patchy confluent enhancing lesions throughout the visualized skeleton compatible with bone metastases. IMPRESSION: 1. Innumerable indistinct small enhancing liver masses throughout the liver replacing much of the liver parenchyma compatible with metastatic disease. Mild hepatomegaly. 2. Widespread patchy confluent enhancing lesions throughout the visualized skeleton compatible with bone  metastases. 3. Contracted gallbladder with nonspecific mild diffuse gallbladder wall thickening. No cholelithiasis. No biliary ductal dilatation. No choledocholithiasis. Electronically Signed   By: JCorene Cornea  A Poff M.D.   On: 01/13/2021 10:24   [place] ASCITES (ABDOMEN LIMITED)  Result Date: 01/20/2021 CLINICAL DATA:  Metastatic breast cancer Ascites EXAM: LIMITED ABDOMEN ULTRASOUND FOR ASCITES TECHNIQUE: Limited ultrasound survey for ascites was performed in all four abdominal quadrants. COMPARISON:  None. FINDINGS: Trace mid lower abdominal ascites. IMPRESSION: Trace mid lower abdominal ascites. Electronically Signed   By: Miachel Roux M.D.   On: 01/20/2021 16:40   [place] CORE BIOPSY (LIVER)  Result Date: 01/21/2021 INDICATION: 40 year old with breast cancer and evidence for innumerable hepatic lesions. Tissue diagnosis is needed. EXAM: Ultrasound-guided liver biopsy MEDICATIONS: Moderate sedation ANESTHESIA/SEDATION: Moderate (conscious) sedation was employed during this procedure. A total of Versed 2.0 mg and Fentanyl 100 mcg was administered intravenously. Moderate Sedation Time: 15 minutes. The patient's level of consciousness and vital signs were monitored continuously by radiology nursing throughout the procedure under my direct supervision. FLUOROSCOPY TIME:  Fluoroscopy Time: None COMPLICATIONS: None immediate. PROCEDURE: Informed written consent was obtained from the patient after a thorough discussion of the procedural risks, benefits and alternatives. All questions were addressed. A timeout was performed prior to the initiation of the procedure. Liver was thoroughly evaluated with ultrasound. The liver is diffusely heterogeneous without discrete lesions. Based on the previous MRI, findings are compatible with infiltrative disease in the liver. As a result, the inferior right hepatic lobe was selected due to easy accessibility and heterogeneity in this area. The right side of the abdomen was prepped with  chlorhexidine and sterile field was created. Maximal barrier sterile technique was utilized including caps, mask, sterile gowns, sterile gloves, sterile drape, hand hygiene and skin antiseptic. Skin was anesthetized using 1% lidocaine. Small incision was made. Using ultrasound guidance, 17 gauge coaxial needle was directed into the right hepatic lobe. Total of 3 core biopsies were obtained with an 18 gauge core device. Specimens placed in formalin. Gel-Foam slurry was injected through the 17 gauge needle as it was removed. Bandage placed over the puncture site. FINDINGS: Liver is diffusely heterogeneous without discrete lesions. As a result, heterogeneous area in the right hepatic lobe was targeted for biopsy. Three adequate specimens obtained. No immediate bleeding or hematoma formation. IMPRESSION: Ultrasound-guided core biopsies from the right hepatic lobe. Electronically Signed   By: Markus Daft M.D.   On: 01/21/2021 14:27     ELIGIBLE FOR AVAILABLE RESEARCH PROTOCOL: no  ASSESSMENT: 40 y.o. [place] woman presenting NOV 2021 with stage IV breast cancer as follows:  (a) workup of initial pancytopenia and hemolysis showed a leukoerythroblastic blood picture with bone marrow biopsy 05/07/2020 confirming metastatic carcinoma, estrogen receptor positive; cytogenetics were normal  (b) CT scans at baseline showing multiple bone lesions (both sclerotic and lytic) and a possible mass in the right breast; no definitive lung or liver involvement  (c) right breast upper outer quadrant biopsy x2 and right axillary lymph node biopsy on 05/18/2019 confirmed a clinical 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, grade 1-2, estrogen receptor strongly positive, progesterone receptor moderately positive (1%), HER-2 negative, with an MIB-1 of 2-15%  (d) CA 27-29 was not informative (repeat 05/06/2020 WNL)  (1) letrozole started 05/28/2019  (a) goserelin started 06/07/2019, repeated every 4 weeks  (b) palbociclib  added 06/09/2019  (c) palbociclib dose decreased to 100 mg daily, 21/7, with September 2021 cycle  (d) letrozole and palbociclib discontinued 01/15/2021, with evidence of disease progression  (e) goserelin continued  (2) denosumab/Xgeva started 06/07/2019, repeated every 4 weeks  (3) genetics testing through the myriad [facility] panel  dated 09/22/2019 found no deleterious mutations in the genes tested which included BRCA 1 and 2, CHEK2, PALB 2 and TP53 among others.  (a) variants of uncertain significance were found in ATM, AXN 2, and [place] 3.  (4) restaging studies:  (a) chest CT and bone scan 05/21/2020 shows no visceral disease, stable bone lesions  (b) bilateral mammography and right breast ultrasonography 06/25/2020 shows the measurable disease in the right breast to have decreased.  There is some progressive calcifications that require monitoring  (c) bone scan 11/21/2020 shows no new bone lesions  (d) breast ultrasound 11/12/2020 shows the previously noted 10:00 mass to have resolved.  The right axillary adenopathy also has resolved.  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  The liver function tests are all improved.  Clinically also she feels better, has been able to get back to work, no longer has the awful nausea vomiting or loss of appetite she had previously.  I do not think were going to find a stent double lesion in the ultrasound of the abdomen but it is worth doing and she is scheduled for that next Monday.  I think she will benefit from a blood transfusion that day and I am putting her in for 1 unit on 02/09/2021.  We will also repeat her lab work at that same day.  She will start her second cycle of chemotherapy on 02/12/2021.  I am hoping after 2 cycles perhaps she will no longer be jaundiced.  The 1 possible is the anemia.  This could be from bone marrow involvement and certainly now also perhaps from the capecitabine but I think there may be an element of hemolysis given the very low haptoglobin and high LDH.  As noted above DAT is negative.  I am going to give a brief course of prednisone a try.  She will receive 40 mg every morning beginning tomorrow.  If the hemoglobin is really no better by next Monday we will discontinue that.  Total encounter time 35 minutes.Sarajane Jews C. Miana Politte, MD 02/04/21 5:21 PM Medical Oncology and Hematology [facility] [place], [place] [address] Tel. [phone]    Fax. [phone]   I, Wilburn Mylar, am acting as scribe for Dr. Virgie Dad. Marielis Samara.  I, Lurline Del MD, have reviewed the above documentation for accuracy and completeness, and I agree with the above.   *Total Encounter Time as defined by the Centers for Medicare and Medicaid Services includes, in addition to the face-to-face time of a patient visit (documented in the note  above) non-face-to-face time:  obtaining and reviewing outside history, ordering and reviewing medications, tests or procedures, care coordination (communications with other health care professionals or caregivers) and documentation in the medical record.

## 2021-02-05 ENCOUNTER — Encounter: Payer: Self-pay | Admitting: Oncology

## 2021-02-05 LAB — CANCER ANTIGEN 27.29: CA 27.29: 26.8 U/mL (ref 0.0–38.6)

## 2021-02-08 NOTE — Progress Notes (Signed)
[place]  [phone] [phone]) [phone]     ID: [name] [name] DOB: [birth year]  MR#: [id number]  TMA#:[id number]  Patient Care Team: Townsend Roger, MD as PCP - General (Internal Medicine) Derwood Kaplan, MD as PCP - Hematology/Oncology (Oncology) Hayven Fatima, Virgie Dad, MD as Consulting Physician (Oncology) Jacelyn Pi, MD as Referring Physician (Endocrinology) Armandina Gemma, MD as Consulting Physician (General Surgery) Servando Salina, MD as Consulting Physician (Obstetrics and Gynecology) Raina Mina, RPH-CPP (Pharmacist) Chauncey Cruel, MD OTHER MD:  CHIEF COMPLAINT: metastatic breast cancer, estrogen receptor positive  CURRENT TREATMENT: capecitabine, goserelin, denosumab   INTERVAL HISTORY: [name] returns today for follow up of her metastatic breast cancer.    She was switched to Xeloda on 01/21/2021, 14 days on and 7 days off. She is currently in the her "off week".  She has had some pigment changes but no palmar plantar erythrodysesthesia so far  She underwent follow up abdomen ultrasound earlier today.  The plan just to see if there is a focal obstruction that might possibly be stented.  Results are pending  She was started on prednisone 40 mg every morning last visit.  The plan was to see if we saw improvement in her hemoglobin.  Hemoglobin is up a little from 8.3 now 8.6, and the reticulocyte count has almost doubled.  She is tolerating the prednisone well although she has had some insomnia issues  REVIEW OF SYSTEMS: [name] feels considerably better.  She is working two 12-hour days a week (although this Thursday she is off because it is her birthday!).  She tells me her urine now is normal in color.  Her stool still looks a little strange she says more like copper colored.  She has more energy.  She currently has no nausea or vomiting and her appetite is up.  A detailed review of systems was otherwise stable.  COVID 19  VACCINATION STATUS: Status post Moderna x3, third dose October 2021; booster pending   HISTORY OF CURRENT ILLNESS: From the [place] note dated 04/10/2020 by Dickey Gave, PA-C:   "[name] K Tootle is a 40 y.o. female African American female with stage IV (T3 N1 M1) hormone receptor positive right breast cancer diagnosed in December 2020. We began seeing in November 2020 for leukocytosis, anemia and thrombocytopenia.  She had bruising and nose bleeds, as well as a 10-15 lb weight loss.  She had been having chronic back pain for several months, for which she was using ibuprofen or Aleve.  LDH was markedly elevated at 2599, reticulocyte count mildly elevated at 3.4% and haptoglobin was less than 10, which was consistent with hemolysis, but Coombs was negative.  CT chest, abdomen and pelvis revealed diffusely sclerotic osseous structures with multiple lytic lesions noted throughout the axial skeleton.  Some lesions demonstrated expansile soft tissue components, and these findings are highly concerning for multiple myeloma or other osseous metastatic disease of uncertain origin.  A spiculated appearing mass or tissue element was also seen in the lateral right breast, 9 o'clock.  Bone marrow biopsy revealed abundant metastatic carcinoma, which was estrogen receptor positive, so consistent with breast cancer origin.  With that information, it became clear that she had malignant breast cancer that had metastasized to the bone.  She underwent a digital diagnostic bilateral mammogram with right breast ultrasound in December, which revealed highly suspicious calcifications spanning at least 9.2 cm in the upper outer quadrant of the right breast with distortion associated  with the posterior calcifications.  A discrete mass was seen in the right breast with ultrasound at 10 o'clock, 8 cm from the nipple measuring 9 x 6 x 7 mm.  Several mildly abnormal nodes are seen in the right axilla with cortices  measuring between 3 and 5 mm.  The lymph nodes were symmetric on mammography and were not definitely different compared to 2015.  Stereotactic needle core biopsy of the right breast and right axilla confirmed grade 2, invasive ductal carcinoma, as well as ductal carcinoma in situ.  The invasive component measured 0.9 cm in greatest linear extent on the core biopsy.  Metastatic carcinoma was involved in one lymph node.  Estrogen receptor positive at 90%, progesterone receptor positive at 1%, and HER2 negative.  Ki67 was 15%.  Nuclear medicine PET scan revealed widespread hypermetabolic bony metastatic disease.  Low level hypermetabolism in bilateral axillary nodes, right greater than left was seen with uptake identified in the ill defined soft tissue lesion in the lateral right breast.  Foci of hypermetabolism identified in the central uterus along the IUD and along the anterior cervix and adjacent small bowel, which were nonspecific.  MRI head did not reveal intracranial metastasis, but diffusely abnormal bone marrow in the skull and cervical spine likely due to metastatic disease was observed.  CA 27-29 was not elevated.   Due to her age of diagnosis, she underwent testing for hereditary breast cancer with the Myriad  myRisk hereditary cancer panel test.  This did not reveal any clinically significant mutation.  There were variants of uncertain significance of the ATM, AXIN2 and MSH3 genes.   She had severer pain, so was placed on methadone, in addition to oxycodone and the doses were titrated up.  We recommended letrozole/palbociclib, denosumab monthly for the bone metastasis and goserelin monthly to suppress ovarian function due to being premenopausal.  She started letrozole on December 28th and denosumab and goserelin on January 7th.  She started palbociclib for 3 weeks on and one week off on January 9th.   She develops severe hypocalcemia, which required IV calcium replacement. Denosumab had to be held due to  the severe hypocalcemia, but was resumed in March.  She was placed on high doses of oral calcium.  She had multiple other issues, such as decreased appetite, nausea and vomiting, as well  As dehydration, managed with medications. MRI thoracic and lumbar spine on January 19th revealed diffusely decreased T1 bone marrow signal with fairly homogeneous enhancement throughout the thoracolumbar spine.  Prior PET-CT demonstrated homogeneous hypermetabolic activity throughout the thoracolumbar spine.  These findings are favored to be related to the patient's anemia rather than diffuse metastatic disease.  There were possible small osseous metastases, notably within the T3 spinous process, left L2 pedicle and upper right sacrum.  There was no significant disc herniation, spinal stenosis or nerve root encroachment.  She was subsequently weaned off methadone and has not had recurrent pain.  She developed cytopenias due to palbociclib and we occasionally had to delay cycles   CT chest, abdomen and pelvis in May revealed diffuse patchy confluent sclerotic osseous metastatic disease throughout the axial and proximal appendicular skeleton, probably representing treatment effect.  ThereWere healed deformities throughout the bilateral ribs.  A new mild right axillary lymphadenopathy was also observed, which wasnonspecific.  No additional sites of metastatic disease were seen in the chest, abdomen or pelvis.  She had a  tubal ligation and removal of her Mirena IUD in June.  CT chest, abdomen and  pelvis in September revealed again widespread osseous sclerosis, indicative of treated metastatic disease.  The previous borderline enlarged right axillary lymph nodes had resolved.  She had recurrent cytopenias, and since we had to delay multiple cycles of palbociclib, we decreased her palbociclib dose to 100 mg daily for 3 weeks on a week off."  The patient's subsequent history is as detailed below.   PAST MEDICAL HISTORY: Past  Medical History:  Diagnosis Date   Anemia    History of cardiac murmur as a child    History of Graves' disease    dx hyperthyroidism during 4th pregnancy;   11-15-2013  s/p  total thyroidectomy   Hypothyroidism, postsurgical 11/15/2013   endocrinologist--- dr balan   Pre-diabetes    Stage IV breast cancer in female [facility]) 05/2019   oncologist-- dr c. Hinton Rao ([facility]) bone marrow bx 05-08-2019 and right breast bx 05-17-2020--- primary breast w/ mets to bones, pt taking oral chemo (ibrance)  Tumor associated hemolysis at presentation December 2020   PAST SURGICAL HISTORY: Past Surgical History:  Procedure Laterality Date   IUD REMOVAL N/A 11/22/2019   Procedure: INTRAUTERINE DEVICE (IUD) REMOVAL;  Surgeon: Servando Salina, MD;  Location: [place];  Service: Gynecology;  Laterality: N/A;   LAPAROSCOPIC TUBAL LIGATION Bilateral 11/22/2019   Procedure: LAPAROSCOPIC TUBAL LIGATION By Cautery;  Surgeon: Servando Salina, MD;  Location: [place];  Service: Gynecology;  Laterality: Bilateral;   THYROIDECTOMY Bilateral 11/15/2013   Procedure: TOTAL THYROIDECTOMY;  Surgeon: Earnstine Regal, MD;  Location: [facility];  Service: General;  Laterality: Bilateral;   WISDOM TOOTH EXTRACTION  2012    FAMILY HISTORY: Family History  Problem Relation Age of Onset   Cancer Mother        Peripheral T cell Lymphoma   Diabetes Father    Diabetes Maternal Grandmother   The patient's mother died at age 47 shortly after her diagnosis of T-cell non-Hodgkin's lymphoma. The patient's father is 62 years old as of December 2020. He has a history of diabetes, is a bilateral amputee, and has a history of EtOH abuse. The patient has 2 half siblings on her mother side and 3/2 siblings on her father's side. None of them have a history of cancer. She has a maternal cousin with a history of B-cell non-Hodgkin's lymphoma and a maternal grandfather with prostate  cancer   GYNECOLOGIC HISTORY:  Patient's last menstrual period was 10/26/2018. Menarche: 40 years old Age at first live birth: 40 years old [place] P 4 LMP December 2020 (when goserelin started Contraceptive: Status post bilateral tubal ligation HRT n/a  Hysterectomy? no BSO? no   SOCIAL HISTORY: (updated 04/2020)  [name] is divorced. Her former husband Karys Meckley is a Armed forces technical officer for the [employer]. 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  She works on Friday Saturdays and Sundays only.  Her daughters Jeanella Cara, and Stanton, age 48-9 as of December 2021. They stay 1 week with the patient in 1 week with her ex-husband. The patient also has a burn doodle called Karlene Lineman. The patient is a Psychologist, forensic    ADVANCED DIRECTIVES: Not in place. At the 05/06/2020 visit the patient was given the appropriate documents to complete and notarized at her discretion.   HEALTH MAINTENANCE: Social History   Tobacco Use   Smoking status: Never   Smokeless tobacco: Never  Vaping Use   Vaping Use: Never used  Substance Use Topics   Alcohol use: Not Currently  Comment: social   Drug use: Never     Colonoscopy: n/a (age)  PAP: Up-to-date  Bone density: Remote   No Known Allergies  Current Outpatient Medications  Medication Sig Dispense Refill   b complex vitamins capsule Take 1 capsule by mouth daily.     Calcium Carb-Cholecalciferol (CALCIUM 600 + D PO) Take 4 tablets by mouth daily.     capecitabine (XELODA) 500 MG tablet Take 3 tablets (1,500 mg total) by mouth 2 (two) times daily after a meal. Take on days 1-14. Repeat every 21 days. 84 tablet 4   capecitabine (XELODA) 500 MG tablet Take 3 tablets (1,500 mg total) by mouth 2 (two) times daily after a meal. Start 01/22/2021 with supper; continue for 14 days 30 tablet 0   Cholecalciferol (VITAMIN D3) 25 MCG (1000 UT) CAPS Take 3 capsules by mouth daily.     Denosumab (XGEVA Maxeys) Inject into the skin every 30 (thirty) days.      Goserelin Acetate (ZOLADEX [place]) Inject into the skin every 30 (thirty) days.     levothyroxine (SYNTHROID) 112 MCG tablet 1 tablet in the morning on an empty stomach     omeprazole (PRILOSEC) 40 MG capsule Take 1 capsule (40 mg total) by mouth at bedtime. 30 capsule 1   ondansetron (ZOFRAN) 8 MG tablet Take 1 tablet (8 mg total) by mouth 3 (three) times daily as needed for nausea or vomiting. 18 tablet 1   potassium chloride (KLOR-CON) 10 MEQ tablet Take 1 tablet (10 mEq total) by mouth daily. 69 tablet 3   predniSONE (DELTASONE) 20 MG tablet Take 1 tablet (20 mg total) by mouth daily with breakfast. 60 tablet 0   prochlorperazine (COMPAZINE) 10 MG tablet Take 1 tablet (10 mg total) by mouth every 6 (six) hours as needed for nausea or vomiting. 30 tablet 0   valACYclovir (VALTREX) 500 MG tablet Take 1 tablet (500 mg total) by mouth daily. (Patient not taking: Reported on 01/08/2021) 90 tablet 4   No current facility-administered medications for this visit.    OBJECTIVE: African-American woman who appears stated age  22:   02/09/21 1249  BP: 95/68  Pulse: 86  Resp: 20  Temp: (!) 97 F (36.1 C)  SpO2: 100%       Body mass index is 29.5 kg/m.   Wt Readings from Last 3 Encounters:  02/09/21 177 lb 4.8 oz (80.4 kg)  02/04/21 177 lb 12.8 oz (80.6 kg)  01/28/21 183 lb 2 oz (83.1 kg)     ECOG FS:1 - Symptomatic but completely ambulatory  Sclerae icteric, EOMs intact Wearing a mask No cervical or supraclavicular adenopathy Lungs no rales or rhonchi Heart regular rate and rhythm Abd soft, nontender, positive bowel sounds MSK no focal spinal tenderness, no upper extremity lymphedema Neuro: nonfocal, well oriented, appropriate affect Breasts: Deferred  LAB RESULTS:  CMP     Component Value Date/Time   NA 141 02/09/2021 1243   NA 140 04/16/2020 0000   K 3.6 02/09/2021 1243   CL 105 02/09/2021 1243   CO2 25 02/09/2021 1243   GLUCOSE 164 (H) 02/09/2021 1243   BUN 15  02/09/2021 1243   BUN 18 04/16/2020 0000   CREATININE 0.78 02/09/2021 1243   CREATININE 0.93 11/11/2020 1034   CALCIUM 8.4 (L) 02/09/2021 1243   PROT 6.6 02/09/2021 1243   ALBUMIN 2.8 (L) 02/09/2021 1243   AST 106 (H) 02/09/2021 1243   AST 38 11/11/2020 1034   ALT 91 (H) 02/09/2021  1243   ALT 33 11/11/2020 1034   ALKPHOS 328 (H) 02/09/2021 1243   BILITOT 3.5 (H) 02/09/2021 1243   BILITOT 0.3 11/11/2020 1034   GFRNONAA >60 02/09/2021 1243   GFRNONAA >60 11/11/2020 1034   GFRAA >60 04/28/2019 1218    No results found for: TOTALPROTELP, ALBUMINELP, A1GS, A2GS, BETS, BETA2SER, GAMS, MSPIKE, SPEI  Lab Results  Component Value Date   WBC 8.9 02/09/2021   NEUTROABS PENDING 02/09/2021   HGB 8.6 (L) 02/09/2021   HCT 26.8 (L) 02/09/2021   MCV 91.8 02/09/2021   PLT 151 02/09/2021    No results found for: LABCA2  No components found for: EOFHQR975  No results for input(s): INR in the last 168 hours.   No results found for: LABCA2  No results found for: OIT254  No results found for: DIY641  No results found for: RAX094  Lab Results  Component Value Date   CA2729 26.8 02/04/2021    No components found for: HGQUANT  No results found for: CEA1 / No results found for: CEA1   No results found for: AFPTUMOR  No results found for: CHROMOGRNA  No results found for: KPAFRELGTCHN, LAMBDASER, KAPLAMBRATIO (kappa/lambda light chains)  No results found for: HGBA, HGBA2QUANT, HGBFQUANT, HGBSQUAN (Hemoglobinopathy evaluation)   Lab Results  Component Value Date   LDH 1,846 (H) 01/28/2021    Lab Results  Component Value Date   IRON 176 (H) 01/14/2021   TIBC 176 (L) 01/14/2021   IRONPCTSAT 100 (H) 01/14/2021   (Iron and TIBC)  Lab Results  Component Value Date   FERRITIN 2,994 (H) 01/14/2021    Urinalysis    Component Value Date/Time   COLORURINE YELLOW 04/28/2019 1223   APPEARANCEUR HAZY (A) 04/28/2019 1223   LABSPEC 1.024 04/28/2019 1223   PHURINE 5.0  04/28/2019 1223   GLUCOSEU NEGATIVE 04/28/2019 1223   HGBUR NEGATIVE 04/28/2019 [place] 04/28/2019 [place] 04/28/2019 [place] 04/28/2019 1223   NITRITE NEGATIVE 04/28/2019 1223   LEUKOCYTESUR NEGATIVE 04/28/2019 1223    STUDIES: MR Brain W Wo Contrast  Result Date: 01/19/2021 CLINICAL DATA:  40 year old female with metastatic breast cancer. Staging. EXAM: MRI HEAD WITHOUT AND WITH CONTRAST TECHNIQUE: Multiplanar, multiecho pulse sequences of the brain and surrounding structures were obtained without and with intravenous contrast. CONTRAST:  14m GADAVIST GADOBUTROL 1 MMOL/ML IV SOLN [facility]  [facility] FINDINGS: Brain: There is a mildly undulating appearance of the enhancing packing meninges along the frontal convexities, more so on the right (series 16, image 88). And 1 of these rounded areas of dural thickening (that seen on series 16, image 101) is stable since 2020 and appears benign. However, a larger 10 mm area along the right inferior frontal gyrus on series 16, image 81 is new but only 3 mm thick. This lesion is visible on the coronal postcontrast series 19, image 6 (the more inferior and lateral) where similar superior dural nodularity appears fairly stable since the 2020 postcontrast coronal. Furthermore, contralateral left frontal convexity mild dural thickening and nodularity (series 16, image 99) also appears similar 2020. The bilateral dural changes are also visible on axial FLAIR (series 11, images 32 and 35). No abnormal intra-axial enhancement (vascular related enhancement suspected in the left basal ganglia on series 16, image 74). No cerebral edema. No restricted diffusion to suggest acute infarction. No midline shift, mass effect, ventriculomegaly, extra-axial collection or acute intracranial hemorrhage. Cervicomedullary junction and  pituitary are within normal limits. Scattered small  mostly subcortical white matter T2 and FLAIR hyperintensity in both hemispheres has mildly increased since 2020, now mild to moderate for age. This is in a nonspecific configuration. No chronic cerebral blood products. No cortical encephalomalacia. No restricted diffusion to suggest acute infarction. No midline shift, mass effect, evidence of mass lesion, ventriculomegaly, extra-axial collection or acute intracranial hemorrhage. Cervicomedullary junction and pituitary are within normal limits. Vascular: Major intracranial vascular flow voids appear stable. The major dural venous sinuses are enhancing and appear to be patent. Skull and upper cervical spine: Generalized decreased bone marrow signal throughout the skull and cervical spine was present in 2020, with no destructive or discrete suspicious bone lesion identified. Sinuses/Orbits: Stable, negative. Other: Mastoids are clear. Visible internal auditory structures appear normal. Visible scalp and face appear negative. IMPRESSION: 1. Subtle, indeterminate dural thickening and mild nodularity along both frontal convexities. Some of these areas appear stable since 2020 suggesting benign etiology, and there is no associated cerebral edema, distinct overlying skull metastasis (see #2), or intra-axial brain metastasis identified. A short interval repeat Head MRI without and with contrast in 3 months would be helpful to evaluate stability. But in the meantime, CSF analysis could be considered if there is a high suspicion of dural metastases. 2. Diffusely decreased T1 bone marrow signal throughout the skull and visible cervical spine, but no destructive skull lesion. Electronically Signed   By: Genevie Ann M.D.   On: 01/19/2021 06:24   MR LIVER W WO CONTRAST  Result Date: 01/13/2021 CLINICAL DATA:  Metastatic breast cancer. Evaluate possible liver metastasis. EXAM: MRI ABDOMEN WITHOUT AND WITH CONTRAST TECHNIQUE: Multiplanar multisequence MR imaging of the abdomen was  performed both before and after the administration of intravenous contrast. CONTRAST:  28m GADAVIST GADOBUTROL 1 MMOL/ML IV SOLN COMPARISON:  02/13/2020 CT chest, abdomen and pelvis. FINDINGS: Lower chest: No acute abnormality at the lung bases. Hepatobiliary: Mild hepatomegaly. No hepatic steatosis. Innumerable indistinct small enhancing mildly T2 hyperintense liver masses throughout the liver replacing much of the liver parenchyma compatible with metastatic disease. Representative anterior segment 3 left liver 1.2 x 0.9 cm mass (series 6/image 17), posterior segment 31.4 x 1.0 cm mass (series 6/image 16) and segment 6 right liver 1.0 x 0.8 cm mass (series 6/image 22). Simple 1.0 cm segment 2 left liver cyst. Contracted gallbladder with mild diffuse gallbladder wall thickening. No cholelithiasis. No pericholecystic fluid. No biliary ductal dilatation. Common bile duct diameter 2 mm. No choledocholithiasis. No biliary masses, strictures or beading. Pancreas: No pancreatic mass or duct dilation.  No pancreas divisum. Spleen: Normal size. No mass. Adrenals/Urinary Tract: Normal adrenals. No hydronephrosis. Normal kidneys with no renal mass. Stomach/Bowel: Normal non-distended stomach. Visualized small and large bowel is normal caliber, with no bowel wall thickening. Vascular/Lymphatic: Normal caliber abdominal aorta. Patent portal, splenic, hepatic and renal veins. No pathologically enlarged lymph nodes in the abdomen. Other: No abdominal ascites or focal fluid collection. Musculoskeletal: Widespread patchy confluent enhancing lesions throughout the visualized skeleton compatible with bone metastases. IMPRESSION: 1. Innumerable indistinct small enhancing liver masses throughout the liver replacing much of the liver parenchyma compatible with metastatic disease. Mild hepatomegaly. 2. Widespread patchy confluent enhancing lesions throughout the visualized skeleton compatible with bone metastases. 3. Contracted  gallbladder with nonspecific mild diffuse gallbladder wall thickening. No cholelithiasis. No biliary ductal dilatation. No choledocholithiasis. Electronically Signed   By: JIlona SorrelM.D.   On: 01/13/2021 10:24   [place]CITES (ABDOMEN LIMITED)  Result Date:  01/20/2021 CLINICAL DATA:  Metastatic breast cancer Ascites EXAM: LIMITED ABDOMEN ULTRASOUND FOR ASCITES TECHNIQUE: Limited ultrasound survey for ascites was performed in all four abdominal quadrants. COMPARISON:  None. FINDINGS: Trace mid lower abdominal ascites. IMPRESSION: Trace mid lower abdominal ascites. Electronically Signed   By: Miachel Roux M.D.   On: 01/20/2021 16:40   [place] CORE BIOPSY (LIVER)  Result Date: 01/21/2021 INDICATION: 40 year old with breast cancer and evidence for innumerable hepatic lesions. Tissue diagnosis is needed. EXAM: Ultrasound-guided liver biopsy MEDICATIONS: Moderate sedation ANESTHESIA/SEDATION: Moderate (conscious) sedation was employed during this procedure. A total of Versed 2.0 mg and Fentanyl 100 mcg was administered intravenously. Moderate Sedation Time: 15 minutes. The patient's level of consciousness and vital signs were monitored continuously by radiology nursing throughout the procedure under my direct supervision. FLUOROSCOPY TIME:  Fluoroscopy Time: None COMPLICATIONS: None immediate. PROCEDURE: Informed written consent was obtained from the patient after a thorough discussion of the procedural risks, benefits and alternatives. All questions were addressed. A timeout was performed prior to the initiation of the procedure. Liver was thoroughly evaluated with ultrasound. The liver is diffusely heterogeneous without discrete lesions. Based on the previous MRI, findings are compatible with infiltrative disease in the liver. As a result, the inferior right hepatic lobe was selected due to easy accessibility and heterogeneity in this area. The right side of the abdomen was prepped with chlorhexidine and sterile  field was created. Maximal barrier sterile technique was utilized including caps, mask, sterile gowns, sterile gloves, sterile drape, hand hygiene and skin antiseptic. Skin was anesthetized using 1% lidocaine. Small incision was made. Using ultrasound guidance, 17 gauge coaxial needle was directed into the right hepatic lobe. Total of 3 core biopsies were obtained with an 18 gauge core device. Specimens placed in formalin. Gel-Foam slurry was injected through the 17 gauge needle as it was removed. Bandage placed over the puncture site. FINDINGS: Liver is diffusely heterogeneous without discrete lesions. As a result, heterogeneous area in the right hepatic lobe was targeted for biopsy. Three adequate specimens obtained. No immediate bleeding or hematoma formation. IMPRESSION: Ultrasound-guided core biopsies from the right hepatic lobe. Electronically Signed   By: Markus Daft M.D.   On: 01/21/2021 14:27     ELIGIBLE FOR AVAILABLE RESEARCH PROTOCOL: no  ASSESSMENT: 40 y.o. [place] woman presenting NOV 2021 with stage IV breast cancer as follows:  (a) workup of initial pancytopenia and hemolysis showed a leukoerythroblastic blood picture with bone marrow biopsy 05/07/2020 confirming metastatic carcinoma, estrogen receptor positive; cytogenetics were normal  (b) CT scans at baseline showing multiple bone lesions (both sclerotic and lytic) and a possible mass in the right breast; no definitive lung or liver involvement  (c) right breast upper outer quadrant biopsy x2 and right axillary lymph node biopsy on 05/18/2019 confirmed a clinical 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, grade 1-2, estrogen receptor strongly positive, progesterone receptor moderately positive (1%), HER-2 negative, with an MIB-1 of 2-15%  (d) CA 27-29 was not informative (repeat 05/06/2020 WNL)  (1) letrozole started 05/28/2019  (a) goserelin started 06/07/2019, repeated every 4 weeks  (b) palbociclib added 06/09/2019  (c)  palbociclib dose decreased to 100 mg daily, 21/7, with September 2021 cycle  (d) letrozole and palbociclib discontinued 01/15/2021, with evidence of disease progression  (e) goserelin continued  (2) denosumab/Xgeva started 06/07/2019, repeated every 4 weeks  (3) genetics testing through the [facility] panel dated 09/22/2019 found no deleterious mutations in the genes tested which included BRCA 1 and 2, CHEK2,  PALB 2 and TP53 among others.  (a) variants of uncertain significance were found in ATM, AXN 2, and [place] 3.  (4) restaging studies:  (a) chest CT and bone scan 05/21/2020 shows no visceral disease, stable bone lesions  (b) bilateral mammography and right breast ultrasonography 06/25/2020 shows the measurable disease in the right breast to have decreased.  There is some progressive calcifications that require monitoring  (c) bone scan 11/21/2020 shows no new bone lesions  (d) breast ultrasound 11/12/2020 shows the previously noted 10:00 mass to have resolved.  The right axillary adenopathy also has resolved.  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  She was already having less nausea and vomiting before completing it so I think the improvement in symptoms is not just that this is her off week or that she is receiving steroids.  In fact her liver function test continue to improve.  We went over those in detail today.  The hemoglobin is slightly better.  She does not need a transfusion today and does not want 1.  We are going to repeat a CBC next week just to make sure things do not change significantly or perhaps improve but I think we can start seeing her on an every 3-week basis otherwise as we normally do with our capecitabine patients  I am dropping the prednisone to 20 mg daily and depending on her lab work next week we may go to 10 mg daily.  She knows to call for any other issue that may develop before the next visit.  Total encounter time 25 minutes.Sarajane Jews C. Reiana Poteet, MD 02/09/21 1:24 PM Medical Oncology and Hematology [facility] [place],  [address] Tel. [phone]    Fax. [phone]   I, Wilburn Mylar, am acting as scribe for Dr. Virgie Dad. Silver Achey.  I, Lurline Del MD, have reviewed the above documentation for accuracy and completeness, and I agree with the above.   *Total Encounter Time as defined by the Centers for Medicare and Medicaid Services includes, in addition to the face-to-face time of a patient visit (documented in the note above) non-face-to-face time: obtaining and reviewing outside history, ordering and reviewing medications, tests or procedures, care coordination (communications with other health care professionals or caregivers) and documentation in the medical record.

## 2021-02-09 ENCOUNTER — Other Ambulatory Visit: Payer: Self-pay

## 2021-02-09 ENCOUNTER — Other Ambulatory Visit: Payer: Self-pay | Admitting: *Deleted

## 2021-02-09 ENCOUNTER — Ambulatory Visit (HOSPITAL_COMMUNITY)
Admission: RE | Admit: 2021-02-09 | Discharge: 2021-02-09 | Disposition: A | Discharge status: Home / Self Care | Payer: Commercial Managed Care - PPO | Source: Ambulatory Visit | Attending: Hematology | Admitting: Hematology

## 2021-02-09 ENCOUNTER — Inpatient Hospital Stay: Payer: Commercial Managed Care - PPO

## 2021-02-09 ENCOUNTER — Encounter: Payer: Self-pay | Admitting: Oncology

## 2021-02-09 ENCOUNTER — Inpatient Hospital Stay (HOSPITAL_BASED_OUTPATIENT_CLINIC_OR_DEPARTMENT_OTHER): Payer: Commercial Managed Care - PPO | Admitting: Oncology

## 2021-02-09 VITALS — BP 95/68 | HR 86 | Temp 97.0°F | Resp 20 | Wt 177.3 lb

## 2021-02-09 DIAGNOSIS — E0500 Thyrotoxicosis with diffuse goiter without thyrotoxic crisis or storm: Secondary | ICD-10-CM

## 2021-02-09 DIAGNOSIS — D649 Anemia, unspecified: Secondary | ICD-10-CM

## 2021-02-09 DIAGNOSIS — Z170 Estrogen receptor positive status [ER+]: Secondary | ICD-10-CM | POA: Insufficient documentation

## 2021-02-09 DIAGNOSIS — C7951 Secondary malignant neoplasm of bone: Secondary | ICD-10-CM

## 2021-02-09 DIAGNOSIS — C50811 Malignant neoplasm of overlapping sites of right female breast: Secondary | ICD-10-CM

## 2021-02-09 DIAGNOSIS — C787 Secondary malignant neoplasm of liver and intrahepatic bile duct: Secondary | ICD-10-CM

## 2021-02-09 DIAGNOSIS — D582 Other hemoglobinopathies: Secondary | ICD-10-CM

## 2021-02-09 DIAGNOSIS — D5919 Other autoimmune hemolytic anemia: Secondary | ICD-10-CM

## 2021-02-09 DIAGNOSIS — C50411 Malignant neoplasm of upper-outer quadrant of right female breast: Secondary | ICD-10-CM | POA: Diagnosis not present

## 2021-02-09 DIAGNOSIS — C50911 Malignant neoplasm of unspecified site of right female breast: Secondary | ICD-10-CM | POA: Diagnosis not present

## 2021-02-09 LAB — RETICULOCYTES
Immature Retic Fract: 35.3 % — ABNORMAL HIGH (ref 2.3–15.9)
RBC.: 2.98 MIL/uL — ABNORMAL LOW (ref 3.87–5.11)
Retic Count, Absolute: 105.2 10*3/uL (ref 19.0–186.0)
Retic Ct Pct: 3.5 % — ABNORMAL HIGH (ref 0.4–3.1)

## 2021-02-09 LAB — CBC WITH DIFFERENTIAL/PLATELET
Abs Immature Granulocytes: 0.1 10*3/uL — ABNORMAL HIGH (ref 0.00–0.07)
Band Neutrophils: 1 %
Basophils Absolute: 0.1 10*3/uL (ref 0.0–0.1)
Basophils Relative: 1 %
Eosinophils Absolute: 0 10*3/uL (ref 0.0–0.5)
Eosinophils Relative: 0 %
HCT: 26.8 % — ABNORMAL LOW (ref 36.0–46.0)
Hemoglobin: 8.6 g/dL — ABNORMAL LOW (ref 12.0–15.0)
Lymphocytes Relative: 30 %
Lymphs Abs: 2.7 10*3/uL (ref 0.7–4.0)
MCH: 29.5 pg (ref 26.0–34.0)
MCHC: 32.1 g/dL (ref 30.0–36.0)
MCV: 91.8 fL (ref 80.0–100.0)
Metamyelocytes Relative: 1 %
Monocytes Absolute: 0.8 10*3/uL (ref 0.1–1.0)
Monocytes Relative: 9 %
Neutro Abs: 5.3 10*3/uL (ref 1.7–7.7)
Neutrophils Relative %: 58 %
Platelets: 151 10*3/uL (ref 150–400)
RBC: 2.92 MIL/uL — ABNORMAL LOW (ref 3.87–5.11)
RDW: 24.7 % — ABNORMAL HIGH (ref 11.5–15.5)
WBC: 8.9 10*3/uL (ref 4.0–10.5)
nRBC: 16.2 % — ABNORMAL HIGH (ref 0.0–0.2)

## 2021-02-09 LAB — TYPE AND SCREEN
ABO/RH(D): A POS
Antibody Screen: NEGATIVE

## 2021-02-09 LAB — COMPREHENSIVE METABOLIC PANEL
ALT: 91 U/L — ABNORMAL HIGH (ref 0–44)
AST: 106 U/L — ABNORMAL HIGH (ref 15–41)
Albumin: 2.8 g/dL — ABNORMAL LOW (ref 3.5–5.0)
Alkaline Phosphatase: 328 U/L — ABNORMAL HIGH (ref 38–126)
Anion gap: 11 (ref 5–15)
BUN: 15 mg/dL (ref 6–20)
CO2: 25 mmol/L (ref 22–32)
Calcium: 8.4 mg/dL — ABNORMAL LOW (ref 8.9–10.3)
Chloride: 105 mmol/L (ref 98–111)
Creatinine, Ser: 0.78 mg/dL (ref 0.44–1.00)
GFR, Estimated: >60 mL/min (ref >60)
Glucose, Bld: 164 mg/dL — ABNORMAL HIGH (ref 70–99)
Potassium: 3.6 mmol/L (ref 3.5–5.1)
Sodium: 141 mmol/L (ref 135–145)
Total Bilirubin: 3.5 mg/dL — ABNORMAL HIGH (ref 0.3–1.2)
Total Protein: 6.6 g/dL (ref 6.5–8.1)

## 2021-02-09 LAB — SAVE SMEAR(SSMR), FOR PROVIDER SLIDE REVIEW

## 2021-02-09 IMAGING — US US ABDOMEN LIMITED
1 series · 14 of 25 positions shown · non-contrast
Comparison: MRI from [DATE]

CLINICAL DATA: Known history of metastatic breast carcinoma

EXAM:
ULTRASOUND ABDOMEN LIMITED RIGHT UPPER QUADRANT

[Series 1: us abdomen limited · 14 of 72 slices shown]
[im 1/72]
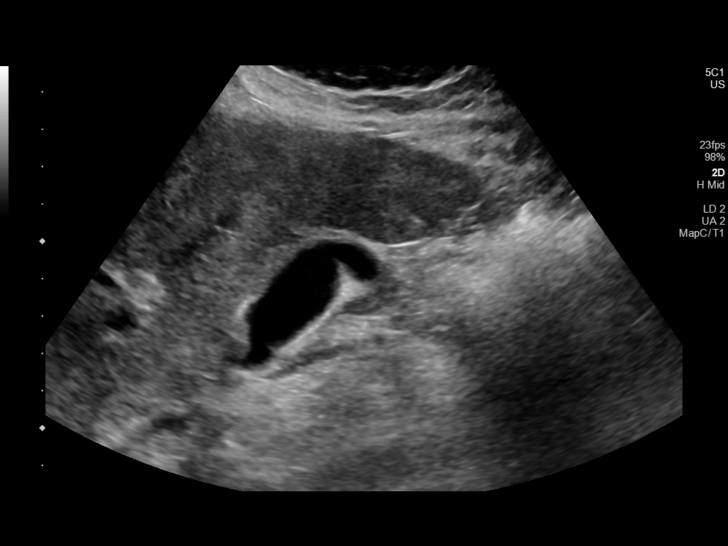
[im 6/72]
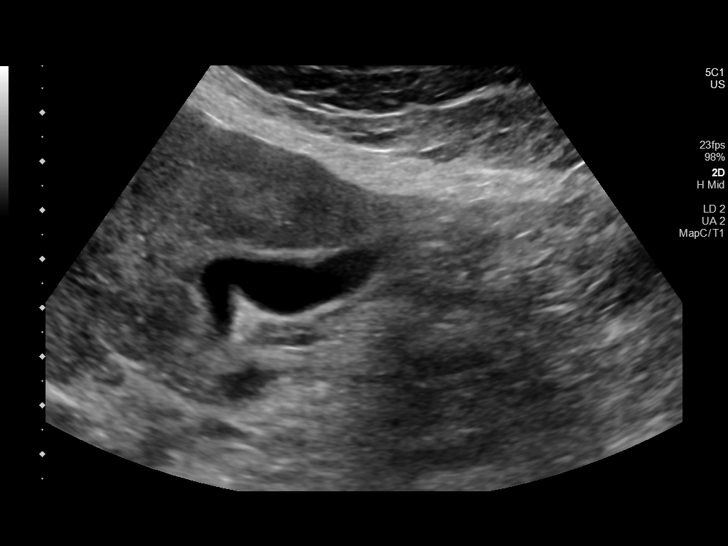
[im 12/72]
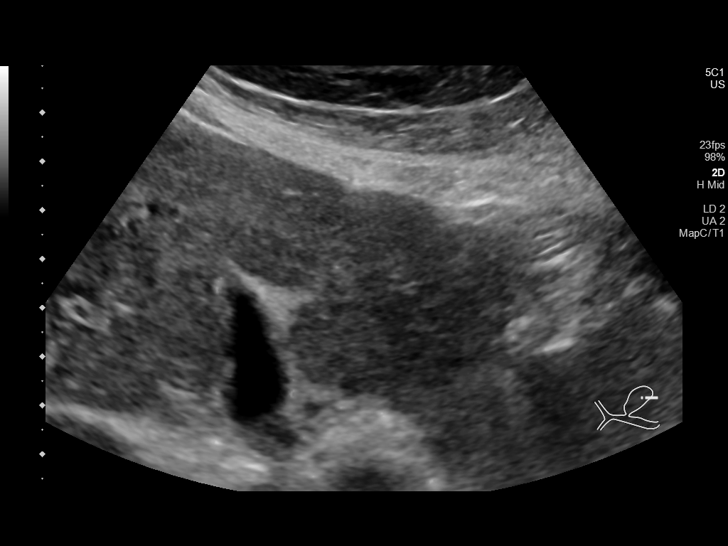
[im 18/72]
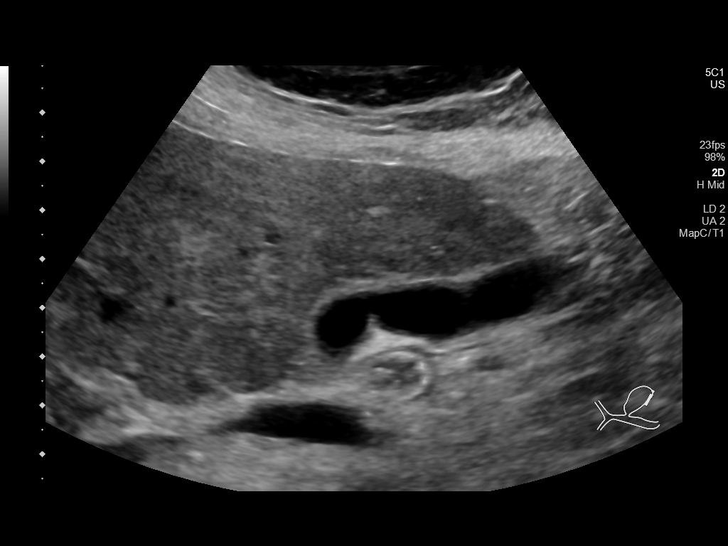
[im 24/72]
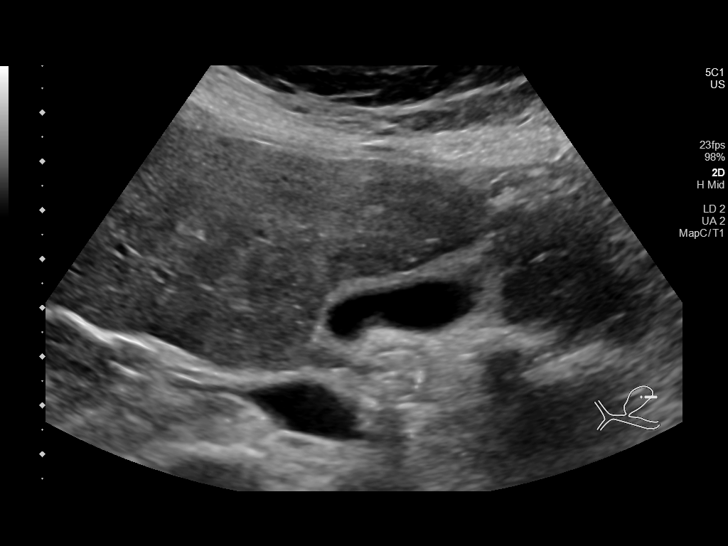
[im 27/72]
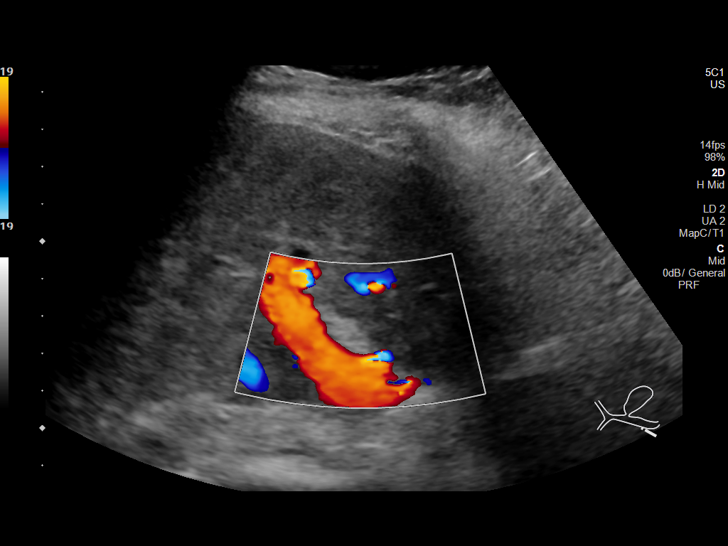
[im 33/72]
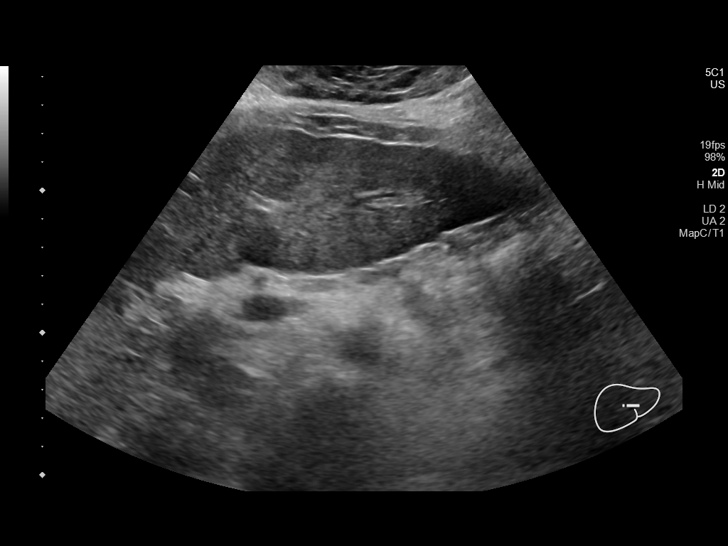
[im 39/72]
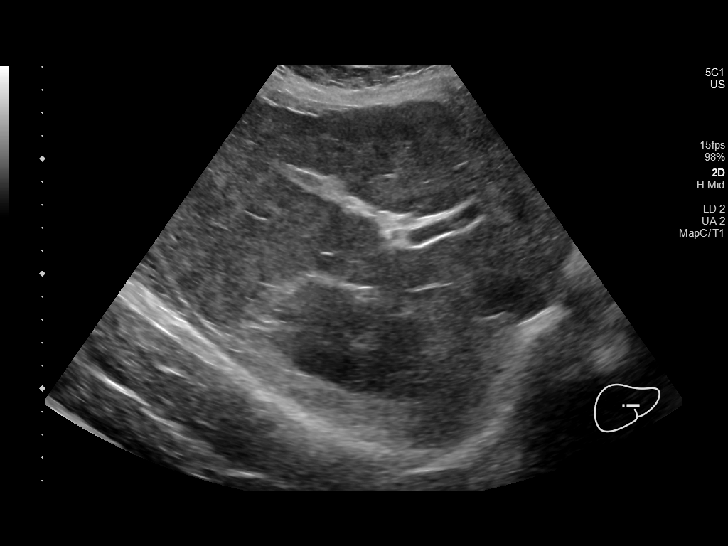
[im 45/72]
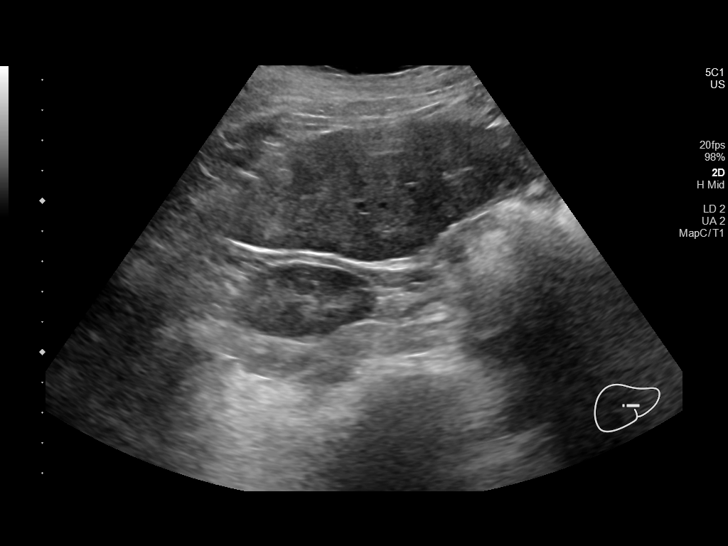
[im 48/72]
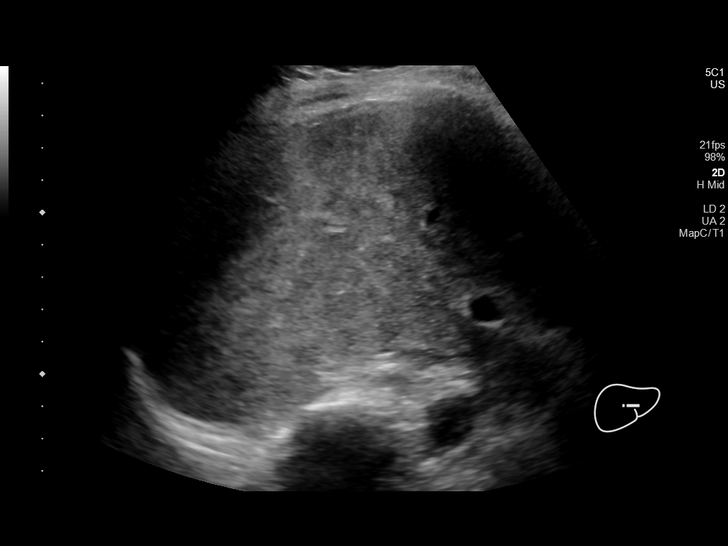
[im 54/72]
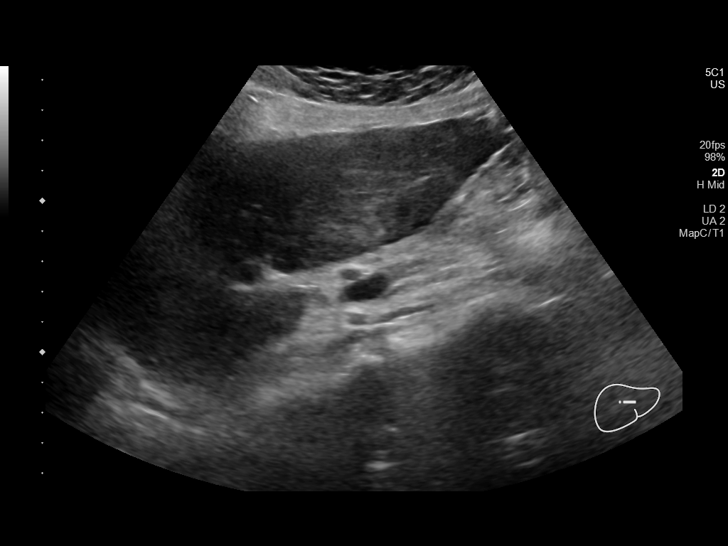
[im 60/72]
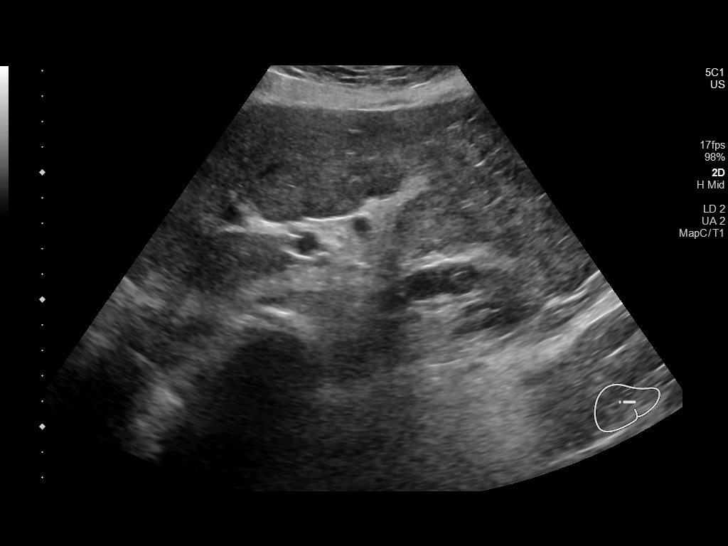
[im 66/72]
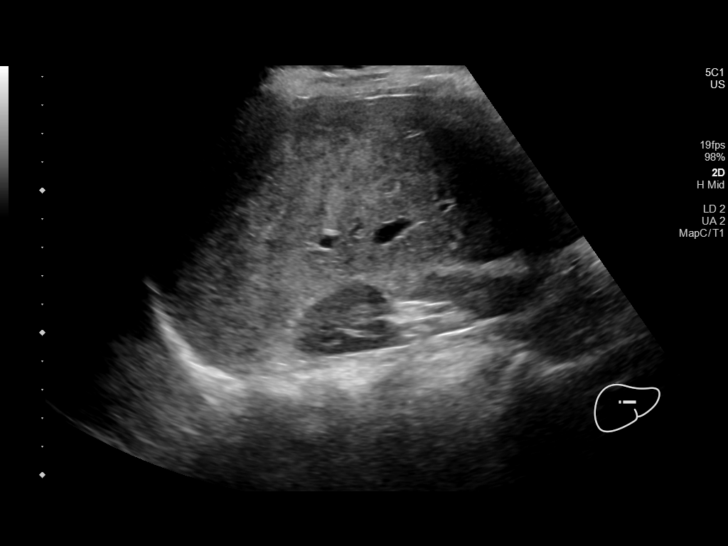
[im 72/72]
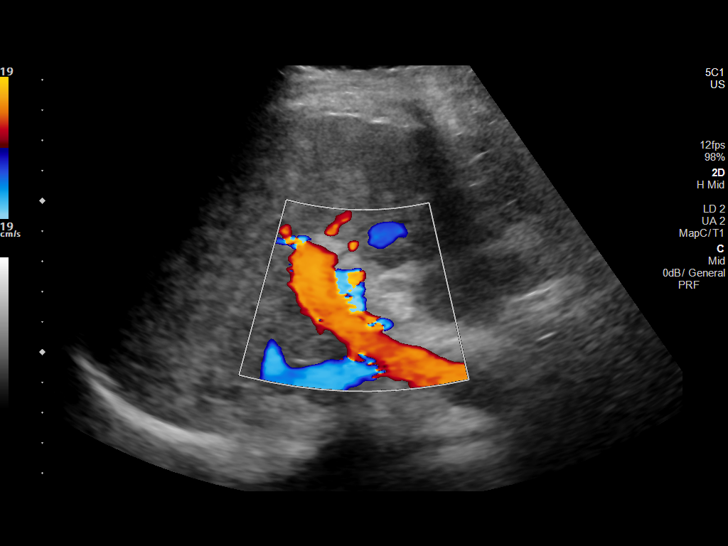

[14 of 25 positions shown; findings below may reference images not displayed]

FINDINGS: Gallbladder:

Predominately decompressed. No wall thickening is noted. No
cholelithiasis is noted.

Common bile duct:

Diameter: 2 mm

Liver:

Heterogeneity of the liver is noted. The previously seen lesions on
MRI are less well appreciated on the ultrasound examination but the
diffuse heterogeneous nature corresponds with that seen on prior
exams. Portal vein is patent on color Doppler imaging with normal
direction of blood flow towards the liver.

Other: None.
IMPRESSION: Heterogeneous liver consistent with the known history of metastatic
breast carcinoma.

No biliary ductal dilatation is seen.

## 2021-02-09 MED ORDER — PREDNISONE 20 MG PO TABS: 20.0000 mg | ORAL_TABLET | Freq: Every day | ORAL | 0 refills | Status: DC

## 2021-02-16 ENCOUNTER — Other Ambulatory Visit: Payer: Self-pay | Admitting: *Deleted

## 2021-02-16 ENCOUNTER — Other Ambulatory Visit: Payer: Self-pay

## 2021-02-16 ENCOUNTER — Inpatient Hospital Stay: Payer: Commercial Managed Care - PPO

## 2021-02-16 ENCOUNTER — Telehealth: Payer: Self-pay | Admitting: *Deleted

## 2021-02-16 DIAGNOSIS — C50811 Malignant neoplasm of overlapping sites of right female breast: Secondary | ICD-10-CM

## 2021-02-16 DIAGNOSIS — D582 Other hemoglobinopathies: Secondary | ICD-10-CM

## 2021-02-16 DIAGNOSIS — E0500 Thyrotoxicosis with diffuse goiter without thyrotoxic crisis or storm: Secondary | ICD-10-CM

## 2021-02-16 DIAGNOSIS — C50911 Malignant neoplasm of unspecified site of right female breast: Secondary | ICD-10-CM | POA: Diagnosis not present

## 2021-02-16 DIAGNOSIS — D5919 Other autoimmune hemolytic anemia: Secondary | ICD-10-CM

## 2021-02-16 DIAGNOSIS — C7951 Secondary malignant neoplasm of bone: Secondary | ICD-10-CM

## 2021-02-16 DIAGNOSIS — C50411 Malignant neoplasm of upper-outer quadrant of right female breast: Secondary | ICD-10-CM

## 2021-02-16 DIAGNOSIS — Z170 Estrogen receptor positive status [ER+]: Secondary | ICD-10-CM

## 2021-02-16 LAB — COMPREHENSIVE METABOLIC PANEL
ALT: 82 U/L — ABNORMAL HIGH (ref 0–44)
AST: 84 U/L — ABNORMAL HIGH (ref 15–41)
Albumin: 2.7 g/dL — ABNORMAL LOW (ref 3.5–5.0)
Alkaline Phosphatase: 280 U/L — ABNORMAL HIGH (ref 38–126)
Anion gap: 9 (ref 5–15)
BUN: 14 mg/dL (ref 6–20)
CO2: 24 mmol/L (ref 22–32)
Calcium: 8.7 mg/dL — ABNORMAL LOW (ref 8.9–10.3)
Chloride: 103 mmol/L (ref 98–111)
Creatinine, Ser: 0.93 mg/dL (ref 0.44–1.00)
GFR, Estimated: >60 mL/min (ref >60)
Glucose, Bld: 330 mg/dL — ABNORMAL HIGH (ref 70–99)
Potassium: 4.2 mmol/L (ref 3.5–5.1)
Sodium: 136 mmol/L (ref 135–145)
Total Bilirubin: 2.9 mg/dL — ABNORMAL HIGH (ref 0.3–1.2)
Total Protein: 6.1 g/dL — ABNORMAL LOW (ref 6.5–8.1)

## 2021-02-16 LAB — RETICULOCYTES
Immature Retic Fract: 28.8 % — ABNORMAL HIGH (ref 2.3–15.9)
RBC.: 2.42 MIL/uL — ABNORMAL LOW (ref 3.87–5.11)
Retic Count, Absolute: 53.5 10*3/uL (ref 19.0–186.0)
Retic Ct Pct: 2.2 % (ref 0.4–3.1)

## 2021-02-16 LAB — CBC WITH DIFFERENTIAL/PLATELET
Abs Immature Granulocytes: 0.09 10*3/uL — ABNORMAL HIGH (ref 0.00–0.07)
Band Neutrophils: 1 %
Basophils Absolute: 0 10*3/uL (ref 0.0–0.1)
Basophils Relative: 0 %
Blasts: 0 %
Eosinophils Absolute: 0 10*3/uL (ref 0.0–0.5)
Eosinophils Relative: 0 %
HCT: 23.2 % — ABNORMAL LOW (ref 36.0–46.0)
Hemoglobin: 7.2 g/dL — ABNORMAL LOW (ref 12.0–15.0)
Lymphocytes Relative: 23 %
Lymphs Abs: 2 10*3/uL (ref 0.7–4.0)
MCH: 29 pg (ref 26.0–34.0)
MCHC: 31 g/dL (ref 30.0–36.0)
MCV: 93.5 fL (ref 80.0–100.0)
Metamyelocytes Relative: 0 %
Monocytes Absolute: 0.4 10*3/uL (ref 0.1–1.0)
Monocytes Relative: 4 %
Myelocytes: 0 %
Neutro Abs: 6.3 10*3/uL (ref 1.7–7.7)
Neutrophils Relative %: 71 %
Other: 0 %
Platelets: 102 10*3/uL — ABNORMAL LOW (ref 150–400)
Promyelocytes Relative: 1 %
RBC: 2.48 MIL/uL — ABNORMAL LOW (ref 3.87–5.11)
RDW: 25.6 % — ABNORMAL HIGH (ref 11.5–15.5)
WBC: 8.8 10*3/uL (ref 4.0–10.5)
nRBC: 1.5 % — ABNORMAL HIGH (ref 0.0–0.2)
nRBC: 10 /100 WBC — ABNORMAL HIGH

## 2021-02-16 LAB — SAVE SMEAR(SSMR), FOR PROVIDER SLIDE REVIEW

## 2021-02-16 NOTE — Telephone Encounter (Signed)
This RN called pt to follow up on labs and current dose of prednisone at 20 mg daily.  Per MD review-no noted great benefit in heme with steroid use- and recommended for pt to decrease to 10 mg for 3 days and then stop.  This RN discussed above with pt understanding possible symptoms relating to stopping steroids that may mimic symptoms of anemia.  She presently has good energy and does feel the need for a blood transfusion.  Appointment made for next Monday for lab to recheck heme for possible need for transfusion as well as pt understands to call if needed for lab before then.

## 2021-02-21 ENCOUNTER — Encounter: Payer: Self-pay | Admitting: Oncology

## 2021-02-21 ENCOUNTER — Other Ambulatory Visit: Payer: Self-pay

## 2021-02-21 ENCOUNTER — Encounter (HOSPITAL_COMMUNITY): Payer: Self-pay

## 2021-02-21 ENCOUNTER — Emergency Department (HOSPITAL_COMMUNITY): Payer: Commercial Managed Care - PPO

## 2021-02-21 ENCOUNTER — Emergency Department (HOSPITAL_COMMUNITY)
Admission: EM | Admit: 2021-02-21 | Discharge: 2021-02-21 | Disposition: A | Discharge status: Home / Self Care | Payer: Commercial Managed Care - PPO | Attending: Emergency Medicine | Admitting: Emergency Medicine

## 2021-02-21 DIAGNOSIS — R9431 Abnormal electrocardiogram [ECG] [EKG]: Secondary | ICD-10-CM

## 2021-02-21 DIAGNOSIS — Z20822 Contact with and (suspected) exposure to covid-19: Secondary | ICD-10-CM | POA: Insufficient documentation

## 2021-02-21 DIAGNOSIS — R000 Tachycardia, unspecified: Secondary | ICD-10-CM | POA: Diagnosis not present

## 2021-02-21 DIAGNOSIS — R112 Nausea with vomiting, unspecified: Secondary | ICD-10-CM | POA: Diagnosis present

## 2021-02-21 DIAGNOSIS — R531 Weakness: Secondary | ICD-10-CM | POA: Diagnosis not present

## 2021-02-21 DIAGNOSIS — Z7984 Long term (current) use of oral hypoglycemic drugs: Secondary | ICD-10-CM | POA: Insufficient documentation

## 2021-02-21 DIAGNOSIS — R7303 Prediabetes: Secondary | ICD-10-CM | POA: Insufficient documentation

## 2021-02-21 LAB — URINALYSIS, ROUTINE W REFLEX MICROSCOPIC
Bacteria, UA: NONE SEEN
Bilirubin Urine: NEGATIVE
Glucose, UA: NEGATIVE mg/dL
Hgb urine dipstick: NEGATIVE
Ketones, ur: NEGATIVE mg/dL
Leukocytes,Ua: NEGATIVE
Nitrite: NEGATIVE
Protein, ur: NEGATIVE mg/dL
Specific Gravity, Urine: 1.015 (ref 1.005–1.030)
pH: 6 (ref 5.0–8.0)

## 2021-02-21 LAB — CBC WITH DIFFERENTIAL/PLATELET
Abs Immature Granulocytes: 0.08 10*3/uL — ABNORMAL HIGH (ref 0.00–0.07)
Basophils Absolute: 0 10*3/uL (ref 0.0–0.1)
Basophils Relative: 0 %
Eosinophils Absolute: 0 10*3/uL (ref 0.0–0.5)
Eosinophils Relative: 1 %
HCT: 24.7 % — ABNORMAL LOW (ref 36.0–46.0)
Hemoglobin: 7.6 g/dL — ABNORMAL LOW (ref 12.0–15.0)
Immature Granulocytes: 2 %
Lymphocytes Relative: 37 %
Lymphs Abs: 2 10*3/uL (ref 0.7–4.0)
MCH: 30 pg (ref 26.0–34.0)
MCHC: 30.8 g/dL (ref 30.0–36.0)
MCV: 97.6 fL (ref 80.0–100.0)
Monocytes Absolute: 0.5 10*3/uL (ref 0.1–1.0)
Monocytes Relative: 9 %
Neutro Abs: 2.7 10*3/uL (ref 1.7–7.7)
Neutrophils Relative %: 51 %
Platelets: 107 10*3/uL — ABNORMAL LOW (ref 150–400)
RBC: 2.53 MIL/uL — ABNORMAL LOW (ref 3.87–5.11)
RDW: 26.3 % — ABNORMAL HIGH (ref 11.5–15.5)
WBC: 5.3 10*3/uL (ref 4.0–10.5)
nRBC: 4.5 % — ABNORMAL HIGH (ref 0.0–0.2)

## 2021-02-21 LAB — COMPREHENSIVE METABOLIC PANEL
ALT: 53 U/L — ABNORMAL HIGH (ref 0–44)
AST: 61 U/L — ABNORMAL HIGH (ref 15–41)
Albumin: 3.2 g/dL — ABNORMAL LOW (ref 3.5–5.0)
Alkaline Phosphatase: 227 U/L — ABNORMAL HIGH (ref 38–126)
Anion gap: 9 (ref 5–15)
BUN: 12 mg/dL (ref 6–20)
CO2: 25 mmol/L (ref 22–32)
Calcium: 8.2 mg/dL — ABNORMAL LOW (ref 8.9–10.3)
Chloride: 100 mmol/L (ref 98–111)
Creatinine, Ser: 0.72 mg/dL (ref 0.44–1.00)
GFR, Estimated: >60 mL/min (ref >60)
Glucose, Bld: 158 mg/dL — ABNORMAL HIGH (ref 70–99)
Potassium: 3.5 mmol/L (ref 3.5–5.1)
Sodium: 134 mmol/L — ABNORMAL LOW (ref 135–145)
Total Bilirubin: 2.9 mg/dL — ABNORMAL HIGH (ref 0.3–1.2)
Total Protein: 6.9 g/dL (ref 6.5–8.1)

## 2021-02-21 LAB — RESP PANEL BY RT-PCR (FLU A&B, COVID) ARPGX2
Influenza A by PCR: NEGATIVE
Influenza B by PCR: NEGATIVE
SARS Coronavirus 2 by RT PCR: NEGATIVE

## 2021-02-21 LAB — LIPASE, BLOOD: Lipase: 41 U/L (ref 11–51)

## 2021-02-21 LAB — TROPONIN I (HIGH SENSITIVITY): Troponin I (High Sensitivity): 4 ng/L (ref <18)

## 2021-02-21 MED ORDER — ONDANSETRON HCL 4 MG/2ML IJ SOLN
4.0000 mg | Freq: Once | INTRAMUSCULAR | Status: AC
  Administered 2021-02-21: 4 mg via INTRAVENOUS
  Filled 2021-02-21: qty 2

## 2021-02-21 MED ORDER — SODIUM CHLORIDE 0.9 % IV BOLUS
500.0000 mL | Freq: Once | INTRAVENOUS | Status: AC
  Administered 2021-02-21: 500 mL via INTRAVENOUS

## 2021-02-21 MED ORDER — ONDANSETRON HCL 8 MG PO TABS: 8.0000 mg | ORAL_TABLET | Freq: Three times a day (TID) | ORAL | 1 refills | Status: DC | PRN

## 2021-02-21 NOTE — Discharge Instructions (Addendum)
Your work-up today was overall reassuring.  Your hemoglobin is the same if not slightly improved from where it was last week. No signs of acute or new infections. Your EKG was slightly abnormal from previous, however there is no sign of active heart damage or fluid buildup.  However I recommend you follow-up with your primary care doctor for further evaluation of your heart.  You may also follow-up with a cardiologist if you would like further evaluation. Follow-up with your oncologist for further management of your cancer treatment. Return to the emergency room with fevers, chest pain, difficulty breathing, any new, worsening, or concerning symptoms.

## 2021-02-21 NOTE — ED Provider Notes (Signed)
[facility] DEPT Provider Note   CSN: [id number] Arrival date & time: 02/21/21  8413     History Chief Complaint  Patient presents with   chemo patient   Emesis   Fatigue    [name] [name] is a 40 y.o. female presenting for evaluation nausea, vomiting, generalized weakness.  Patient states she was started on a new chemo medication 2 weeks ago.  For the past 2 days, she has had worsening nausea and vomiting.  She also reports worsening fatigue.  Last week she was found to be anemic with a hemoglobin of 7.2, she is current concerned her hemoglobin has continued to drop.  She reports 2 episodes of emesis over the past 48 hours, no blood in her emesis.  She denies fever or abdominal pain.  No urinary symptoms or abnormal bowel movements.  She does have a mild cough which she attributes to allergies, states multiple of her family members also have similar cough.  She is fully vaccinated for COVID.  She denies chest pain, shortness of breath. She was able to tolerate PO this am.   Additional history obtained from chart review.  History of anemia, hypothyroidism, stage IV breast cancer with mets to bone on chemo pills.  HPI     Past Medical History:  Diagnosis Date   Anemia    History of cardiac murmur as a child    History of Graves' disease    dx hyperthyroidism during 4th pregnancy;   11-15-2013  s/p  total thyroidectomy   Hypothyroidism, postsurgical 11/15/2013   endocrinologist--- dr balan   Pre-diabetes    Stage IV breast cancer in female [facility]) 05/2019   oncologist-- dr c. Hinton Rao ([facility]) bone marrow bx 05-08-2019 and right breast bx 05-17-2020--- primary breast w/ mets to bones, pt taking oral chemo (ibrance)    Patient Active Problem List   Diagnosis Date Noted   Liver metastases ([place]) 01/14/2021   Goals of care, counseling/discussion 12/26/2020   Malignant neoplasm of upper-outer quadrant of right breast in female, estrogen  receptor positive ([place]) 05/06/2020   Hemolytic anemia ([place]) 05/06/2020   Malignant neoplasm of overlapping sites of right female breast ([place]) 03/08/2020   Bone metastases ([place]) 02/17/2020   Graves disease 11/15/2013    Past Surgical History:  Procedure Laterality Date   IUD REMOVAL N/A 11/22/2019   Procedure: INTRAUTERINE DEVICE (IUD) REMOVAL;  Surgeon: Servando Salina, MD;  Location: [facility];  Service: Gynecology;  Laterality: N/A;   LAPAROSCOPIC TUBAL LIGATION Bilateral 11/22/2019   Procedure: LAPAROSCOPIC TUBAL LIGATION By Cautery;  Surgeon: Servando Salina, MD;  Location: [place] [facility];  Service: Gynecology;  Laterality: Bilateral;   THYROIDECTOMY Bilateral 11/15/2013   Procedure: TOTAL THYROIDECTOMY;  Surgeon: Earnstine Regal, MD;  Location: [facility];  Service: General;  Laterality: Bilateral;   WISDOM TOOTH EXTRACTION  2012     OB History   No obstetric history on file.     Family History  Problem Relation Age of Onset   Cancer Mother        Peripheral T cell Lymphoma   Diabetes Father    Diabetes Maternal Grandmother     Social History   Tobacco Use   Smoking status: Never   Smokeless tobacco: Never  Vaping Use   Vaping Use: Never used  Substance Use Topics   Alcohol use: Not Currently    Comment: social   Drug use: Never    Home Medications Prior to  Admission medications   Medication Sig Start Date End Date Taking? Authorizing Provider  b complex vitamins capsule Take 1 capsule by mouth daily.    [provider]  Calcium Carb-Cholecalciferol (CALCIUM 600 + D PO) Take 4 tablets by mouth daily.    [provider]  capecitabine (XELODA) 500 MG tablet Take 3 tablets (1,500 mg total) by mouth 2 (two) times daily after a meal. Take on days 1-14. Repeat every 21 days. 01/20/21   Magrinat, Virgie Dad, MD  capecitabine (XELODA) 500 MG tablet Take 3 tablets (1,500 mg total) by mouth 2 (two) times daily after a meal. Start  01/22/2021 with supper; continue for 14 days 01/22/21   Magrinat, Virgie Dad, MD  Cholecalciferol (VITAMIN D3) 25 MCG (1000 UT) CAPS Take 3 capsules by mouth daily.    [provider]  Denosumab (XGEVA [place]) Inject into the skin every 30 (thirty) days.    [provider]  Goserelin Acetate (ZOLADEX [place]) Inject into the skin every 30 (thirty) days.    [provider]  levothyroxine (SYNTHROID) 112 MCG tablet 1 tablet in the morning on an empty stomach 01/02/21   [provider]  metFORMIN (GLUCOPHAGE) 1000 MG tablet Take 1,000 mg by mouth daily. 08/27/20   [provider]  omeprazole (PRILOSEC) 40 MG capsule Take 1 capsule (40 mg total) by mouth at bedtime. 02/04/21   Magrinat, Virgie Dad, MD  ondansetron (ZOFRAN) 8 MG tablet Take 1 tablet (8 mg total) by mouth 3 (three) times daily as needed for nausea or vomiting. 02/21/21   Aldea Avis, PA-C  potassium chloride (KLOR-CON) 10 MEQ tablet Take 1 tablet (10 mEq total) by mouth daily. 02/04/21   Magrinat, Virgie Dad, MD  predniSONE (DELTASONE) 20 MG tablet Take 1 tablet (20 mg total) by mouth daily with breakfast. 02/09/21   Magrinat, Virgie Dad, MD  prochlorperazine (COMPAZINE) 10 MG tablet Take 1 tablet (10 mg total) by mouth every 6 (six) hours as needed for nausea or vomiting. 01/28/21   Truitt Merle, MD  valACYclovir (VALTREX) 500 MG tablet Take 1 tablet (500 mg total) by mouth daily. Patient not taking: Reported on 01/08/2021 05/06/20   Magrinat, Virgie Dad, MD    Allergies    Patient has no known allergies.  Review of Systems   Review of Systems  Respiratory:  Positive for cough.   Gastrointestinal:  Positive for nausea and vomiting.  Allergic/Immunologic: Positive for immunocompromised state.  Neurological:  Positive for weakness.  All other systems reviewed and are negative.  Physical Exam Updated Vital Signs BP 110/71 (BP Location: Right Arm)   Pulse 77   Temp 98.2 F (36.8 C) (Oral)   Resp 16   Ht 5\' 5"   (1.651 m)   Wt 81.6 kg   LMP 10/26/2018   SpO2 98%   BMI 29.95 kg/m   Physical Exam Vitals and nursing note reviewed.  Constitutional:      General: She is not in acute distress.    Appearance: Normal appearance.     Comments: Resting in the bed in NAD  HENT:     Head: Normocephalic and atraumatic.  Eyes:     Conjunctiva/sclera: Conjunctivae normal.     Pupils: Pupils are equal, round, and reactive to light.  Cardiovascular:     Rate and Rhythm: Regular rhythm. Tachycardia present.     Pulses: Normal pulses.     Comments: HR 105 on my exam Pulmonary:     Effort: Pulmonary effort is normal.  No respiratory distress.     Breath sounds: Normal breath sounds. No wheezing.     Comments: Speaking in full sentences.  Clear lung sounds in all fields. Abdominal:     General: There is no distension.     Palpations: Abdomen is soft. There is no mass.     Tenderness: There is no abdominal tenderness. There is no guarding or rebound.  Musculoskeletal:        General: Normal range of motion.     Cervical back: Normal range of motion and neck supple.     Right lower leg: No edema.     Left lower leg: No edema.  Skin:    General: Skin is warm and dry.     Capillary Refill: Capillary refill takes less than 2 seconds.  Neurological:     Mental Status: She is alert and oriented to person, place, and time.  Psychiatric:        Mood and Affect: Mood and affect normal.        Speech: Speech normal.        Behavior: Behavior normal.    ED Results / Procedures / Treatments   Labs (all labs ordered are listed, but only abnormal results are displayed) Labs Reviewed  COMPREHENSIVE METABOLIC PANEL - Abnormal; Notable for the following components:      Result Value   Sodium 134 (*)    Glucose, Bld 158 (*)    Calcium 8.2 (*)    Albumin 3.2 (*)    AST 61 (*)    ALT 53 (*)    Alkaline Phosphatase 227 (*)    Total Bilirubin 2.9 (*)    All other components within normal limits  CBC WITH  DIFFERENTIAL/PLATELET - Abnormal; Notable for the following components:   RBC 2.53 (*)    Hemoglobin 7.6 (*)    HCT 24.7 (*)    RDW 26.3 (*)    Platelets 107 (*)    nRBC 4.5 (*)    Abs Immature Granulocytes 0.08 (*)    All other components within normal limits  RESP PANEL BY RT-PCR (FLU A&B, COVID) ARPGX2  LIPASE, BLOOD  URINALYSIS, ROUTINE W REFLEX MICROSCOPIC  TROPONIN I (HIGH SENSITIVITY)    EKG EKG Interpretation  Date/Time:  Saturday February 21 2021 10:58:24 EDT Ventricular Rate:  82 PR Interval:  136 QRS Duration: 79 QT Interval:  403 QTC Calculation: 471 R Axis:   54 Text Interpretation: Sinus rhythm ST elevation, consider inferior injury Since last tracing Non-specific ST-t changes Otherwise no significant change Confirmed by Daleen Bo [phone]) on 02/21/2021 11:08:58 AM  Radiology DG Chest 2 View  Result Date: 02/21/2021 CLINICAL DATA:  40 year old female with cough. EXAM: CHEST - 2 VIEW COMPARISON:  04/17/2019 FINDINGS: The mediastinal contours are within normal limits. No cardiomegaly. The lungs are clear bilaterally without evidence of focal consolidation, pleural effusion, or pneumothorax. No acute osseous abnormality. IMPRESSION: No acute cardiopulmonary process. Electronically Signed   By: Ruthann Cancer M.D.   On: 02/21/2021 11:20    Procedures Procedures   Medications Ordered in ED Medications  ondansetron (ZOFRAN) injection 4 mg (4 mg Intravenous Given 02/21/21 1150)  sodium chloride 0.9 % bolus 500 mL (0 mLs Intravenous Stopped 02/21/21 1428)    ED Course  I have reviewed the triage vital signs and the nursing notes.  Pertinent labs & imaging results that were available during my care of the patient were reviewed by me and considered in my medical decision making (see  chart for details).    MDM Rules/Calculators/A&P                           Patient presented for evaluation of generalized weakness, nausea, vomiting. On exam, pt is nontoxic.   Abdominal exam is overall reassuring, no tenderness palpation.   Will obtain labs to ensure no anemia or electrolyte abnormality.  Will obtain COVID test to rule out cause for weakness.  Will obtain chest x-ray and urine to rule out infections.  Labs interpreted by me, overall reassuring. Hgb at [place]. LFTs and bili are elevated, but at baseline.  COVID and flu negative.  UA without infection.  Chest x-ray viewed and independently interpreted by me, no pneumonia pneumothorax, effusion.  Patient's EKG is abnormal from her baseline.  Troponin however is negative and patient without chest pain.  Discussed findings with patient.  Discussed follow-up with PCP/cardiology.  Discussed with patient that her hemoglobin today is at her baseline.  She is tolerating p.o. without difficulty and remains well-appearing with normal vital signs.  Discussed importance of close follow-up with oncology.  At this time, patient appears safe for discharge.  Return precautions given.  Patient states she understands and agrees to plan.   Final Clinical Impression(s) / ED Diagnoses Final diagnoses:  Non-intractable vomiting with nausea, unspecified vomiting type  Weakness  Abnormal EKG    Rx / DC Orders ED Discharge Orders          Ordered    ondansetron (ZOFRAN) 8 MG tablet  3 times daily PRN        02/21/21 1413             Bomani Oommen, PA-C 02/21/21 1459    Daleen Bo, MD 02/21/21 [phone]

## 2021-02-21 NOTE — ED Triage Notes (Signed)
Patient reports that she is currently receiving chemo for breast cancer. Patient also states her Hgb was 7.2 last week and she feels more fatigue and thinks it is lower. Patient states she ran out of her Zofran and the other antiemetics make her sleepy.

## 2021-02-23 ENCOUNTER — Inpatient Hospital Stay: Payer: Commercial Managed Care - PPO

## 2021-02-23 ENCOUNTER — Other Ambulatory Visit: Payer: Self-pay

## 2021-02-23 DIAGNOSIS — C50911 Malignant neoplasm of unspecified site of right female breast: Secondary | ICD-10-CM | POA: Diagnosis not present

## 2021-02-23 DIAGNOSIS — D582 Other hemoglobinopathies: Secondary | ICD-10-CM

## 2021-02-23 LAB — CBC WITH DIFFERENTIAL (CANCER CENTER ONLY)
Abs Immature Granulocytes: 0.06 10*3/uL (ref 0.00–0.07)
Basophils Absolute: 0 10*3/uL (ref 0.0–0.1)
Basophils Relative: 0 %
Eosinophils Absolute: 0 10*3/uL (ref 0.0–0.5)
Eosinophils Relative: 1 %
HCT: 23.5 % — ABNORMAL LOW (ref 36.0–46.0)
Hemoglobin: 7.3 g/dL — ABNORMAL LOW (ref 12.0–15.0)
Immature Granulocytes: 1 %
Lymphocytes Relative: 43 %
Lymphs Abs: 1.9 10*3/uL (ref 0.7–4.0)
MCH: 30 pg (ref 26.0–34.0)
MCHC: 31.1 g/dL (ref 30.0–36.0)
MCV: 96.7 fL (ref 80.0–100.0)
Monocytes Absolute: 0.5 10*3/uL (ref 0.1–1.0)
Monocytes Relative: 12 %
Neutro Abs: 1.9 10*3/uL (ref 1.7–7.7)
Neutrophils Relative %: 43 %
Platelet Count: 122 10*3/uL — ABNORMAL LOW (ref 150–400)
RBC: 2.43 MIL/uL — ABNORMAL LOW (ref 3.87–5.11)
RDW: 26.2 % — ABNORMAL HIGH (ref 11.5–15.5)
WBC Count: 4.4 10*3/uL (ref 4.0–10.5)
nRBC: 5.2 % — ABNORMAL HIGH (ref 0.0–0.2)

## 2021-02-23 LAB — CMP (CANCER CENTER ONLY)
ALT: 43 U/L (ref 0–44)
AST: 52 U/L — ABNORMAL HIGH (ref 15–41)
Albumin: 3 g/dL — ABNORMAL LOW (ref 3.5–5.0)
Alkaline Phosphatase: 225 U/L — ABNORMAL HIGH (ref 38–126)
Anion gap: 9 (ref 5–15)
BUN: 9 mg/dL (ref 6–20)
CO2: 24 mmol/L (ref 22–32)
Calcium: 8.2 mg/dL — ABNORMAL LOW (ref 8.9–10.3)
Chloride: 102 mmol/L (ref 98–111)
Creatinine: 0.79 mg/dL (ref 0.44–1.00)
GFR, Estimated: >60 mL/min (ref >60)
Glucose, Bld: 150 mg/dL — ABNORMAL HIGH (ref 70–99)
Potassium: 3.3 mmol/L — ABNORMAL LOW (ref 3.5–5.1)
Sodium: 135 mmol/L (ref 135–145)
Total Bilirubin: 2.6 mg/dL — ABNORMAL HIGH (ref 0.3–1.2)
Total Protein: 6.5 g/dL (ref 6.5–8.1)

## 2021-02-23 LAB — SAVE SMEAR(SSMR), FOR PROVIDER SLIDE REVIEW

## 2021-02-23 LAB — RETICULOCYTES
Immature Retic Fract: 33.7 % — ABNORMAL HIGH (ref 2.3–15.9)
RBC.: 2.44 MIL/uL — ABNORMAL LOW (ref 3.87–5.11)
Retic Count, Absolute: 57.3 10*3/uL (ref 19.0–186.0)
Retic Ct Pct: 2.4 % (ref 0.4–3.1)

## 2021-02-23 LAB — SAMPLE TO BLOOD BANK

## 2021-03-01 NOTE — Progress Notes (Signed)
[place]  [phone] [phone]     ID: [name] [name] DOB: [birth year]  MR#: [id number]  BSJ#:[id number]  Patient Care Team: Townsend Roger, MD as PCP - General (Internal Medicine) Derwood Kaplan, MD as PCP - Hematology/Oncology (Oncology) Violetta Lavalle, Virgie Dad, MD as Consulting Physician (Oncology) Jacelyn Pi, MD as Referring Physician (Endocrinology) Armandina Gemma, MD as Consulting Physician (General Surgery) Servando Salina, MD as Consulting Physician (Obstetrics and Gynecology) Raina Mina, RPH-CPP (Pharmacist) Harvie Heck, MD as Physician Assistant (Internal Medicine) Chauncey Cruel, MD OTHER MD:  CHIEF COMPLAINT: metastatic breast cancer, estrogen receptor positive  CURRENT TREATMENT: capecitabine, goserelin, denosumab   INTERVAL HISTORY: [name] returns today for follow up of her metastatic breast cancer.    She was switched to Xeloda on 01/21/2021, 14 days on and 7 days off, at standard doses. She has had some pigment changes but only grade 1 palmar plantar erythrodysesthesia so far.  She has not had problems with diarrhea or mouth sores.  She is obtaining the drug no significant cost  She underwent follow up abdomen ultrasound on 02/09/2021, results showed no biliary ductal dilatation.  Her hemoglobin continues to decline slightly, secondary to ineffective erythropoiesis. Lab Results  Component Value Date   HGB 7.0 (L) 03/02/2021   HGB 7.3 (L) 02/23/2021   HGB 7.6 (L) 02/21/2021   HGB 7.2 (L) 02/16/2021   HGB 8.6 (L) 02/09/2021   However her reticulocyte count was better today, at 99.5, and her platelet count also shows a steady upward trend.  White cell count is steady.  REVIEW OF SYSTEMS: [name] tells me she moved to [place] this weekend with the help of a friend.  That friend also suggested that she should move her treatment to [facility] and specifically Dr. Lindon Romp.  Can you does not yet have an  appointment but she tells me it is in the works.  Aside from that she is having some cramps in her hands and feet which sometimes get in the way of her work.  She denies palpitations dizziness feeling faint or falling.  She denies shortness of breath.  She does admit to being fatigued.  She feels better off the Xeloda: This is her off week and she says she has more energy already.  Aside from that a detailed review of systems today was stable  COVID 19 VACCINATION STATUS: Status post Moderna x3, third dose October 2021; booster pending   HISTORY OF CURRENT ILLNESS: From the [place] note dated 04/10/2020 by Dickey Gave, PA-C:   "[name] [name] is a 40 y.o. female African American female with stage IV (T3 N1 M1) hormone receptor positive right breast cancer diagnosed in December 2020. We began seeing in November 2020 for leukocytosis, anemia and thrombocytopenia.  She had bruising and nose bleeds, as well as a 10-15 lb weight loss.  She had been having chronic back pain for several months, for which she was using ibuprofen or Aleve.  LDH was markedly elevated at 2599, reticulocyte count mildly elevated at 3.4% and haptoglobin was less than 10, which was consistent with hemolysis, but Coombs was negative.  CT chest, abdomen and pelvis revealed diffusely sclerotic osseous structures with multiple lytic lesions noted throughout the axial skeleton.  Some lesions demonstrated expansile soft tissue components, and these findings are highly concerning for multiple myeloma or other osseous metastatic disease of uncertain origin.  A spiculated appearing mass or tissue element was also seen in the  lateral right breast, 9 o'clock.  Bone marrow biopsy revealed abundant metastatic carcinoma, which was estrogen receptor positive, so consistent with breast cancer origin.  With that information, it became clear that she had malignant breast cancer that had metastasized to the bone.  She underwent a  digital diagnostic bilateral mammogram with right breast ultrasound in December, which revealed highly suspicious calcifications spanning at least 9.2 cm in the upper outer quadrant of the right breast with distortion associated with the posterior calcifications.  A discrete mass was seen in the right breast with ultrasound at 10 o'clock, 8 cm from the nipple measuring 9 x 6 x 7 mm.  Several mildly abnormal nodes are seen in the right axilla with cortices measuring between 3 and 5 mm.  The lymph nodes were symmetric on mammography and were not definitely different compared to 2015.  Stereotactic needle core biopsy of the right breast and right axilla confirmed grade 2, invasive ductal carcinoma, as well as ductal carcinoma in situ.  The invasive component measured 0.9 cm in greatest linear extent on the core biopsy.  Metastatic carcinoma was involved in one lymph node.  Estrogen receptor positive at 90%, progesterone receptor positive at 1%, and HER2 negative.  Ki67 was 15%.  Nuclear medicine PET scan revealed widespread hypermetabolic bony metastatic disease.  Low level hypermetabolism in bilateral axillary nodes, right greater than left was seen with uptake identified in the ill defined soft tissue lesion in the lateral right breast.  Foci of hypermetabolism identified in the central uterus along the IUD and along the anterior cervix and adjacent small bowel, which were nonspecific.  MRI head did not reveal intracranial metastasis, but diffusely abnormal bone marrow in the skull and cervical spine likely due to metastatic disease was observed.  CA 27-29 was not elevated.   Due to her age of diagnosis, she underwent testing for hereditary breast cancer with the [employer]  myRisk hereditary cancer panel test.  This did not reveal any clinically significant mutation.  There were variants of uncertain significance of the ATM, AXIN2 and MSH3 genes.   She had severer pain, so was placed on methadone, in addition to  oxycodone and the doses were titrated up.  We recommended letrozole/palbociclib, denosumab monthly for the bone metastasis and goserelin monthly to suppress ovarian function due to being premenopausal.  She started letrozole on December 28th and denosumab and goserelin on January 7th.  She started palbociclib for 3 weeks on and one week off on January 9th.   She develops severe hypocalcemia, which required IV calcium replacement. Denosumab had to be held due to the severe hypocalcemia, but was resumed in March.  She was placed on high doses of oral calcium.  She had multiple other issues, such as decreased appetite, nausea and vomiting, as well  As dehydration, managed with medications. MRI thoracic and lumbar spine on January 19th revealed diffusely decreased T1 bone marrow signal with fairly homogeneous enhancement throughout the thoracolumbar spine.  Prior PET-CT demonstrated homogeneous hypermetabolic activity throughout the thoracolumbar spine.  These findings are favored to be related to the patient's anemia rather than diffuse metastatic disease.  There were possible small osseous metastases, notably within the T3 spinous process, left L2 pedicle and upper right sacrum.  There was no significant disc herniation, spinal stenosis or nerve root encroachment.  She was subsequently weaned off methadone and has not had recurrent pain.  She developed cytopenias due to palbociclib and we occasionally had to delay cycles   CT chest,  abdomen and pelvis in May revealed diffuse patchy confluent sclerotic osseous metastatic disease throughout the axial and proximal appendicular skeleton, probably representing treatment effect.  ThereWere healed deformities throughout the bilateral ribs.  A new mild right axillary lymphadenopathy was also observed, which wasnonspecific.  No additional sites of metastatic disease were seen in the chest, abdomen or pelvis.  She had a  tubal ligation and removal of her Mirena IUD in June.   CT chest, abdomen and pelvis in September revealed again widespread osseous sclerosis, indicative of treated metastatic disease.  The previous borderline enlarged right axillary lymph nodes had resolved.  She had recurrent cytopenias, and since we had to delay multiple cycles of palbociclib, we decreased her palbociclib dose to 100 mg daily for 3 weeks on a week off."  The patient's subsequent history is as detailed below.   PAST MEDICAL HISTORY: Past Medical History:  Diagnosis Date   Anemia    History of cardiac murmur as a child    History of Graves' disease    dx hyperthyroidism during 4th pregnancy;   11-15-2013  s/p  total thyroidectomy   Hypothyroidism, postsurgical 11/15/2013   endocrinologist--- dr balan   Pre-diabetes    Stage IV breast cancer in female [facility]) 05/2019   oncologist-- dr c. Hinton Rao ([facility]) bone marrow bx 05-08-2019 and right breast bx 05-17-2020--- primary breast w/ mets to bones, pt taking oral chemo (ibrance)  Tumor associated hemolysis at presentation December 2020   PAST SURGICAL HISTORY: Past Surgical History:  Procedure Laterality Date   IUD REMOVAL N/A 11/22/2019   Procedure: INTRAUTERINE DEVICE (IUD) REMOVAL;  Surgeon: Servando Salina, MD;  Location: [place];  Service: Gynecology;  Laterality: N/A;   LAPAROSCOPIC TUBAL LIGATION Bilateral 11/22/2019   Procedure: LAPAROSCOPIC TUBAL LIGATION By Cautery;  Surgeon: Servando Salina, MD;  Location: [place];  Service: Gynecology;  Laterality: Bilateral;   THYROIDECTOMY Bilateral 11/15/2013   Procedure: TOTAL THYROIDECTOMY;  Surgeon: Earnstine Regal, MD;  Location: [facility];  Service: General;  Laterality: Bilateral;   WISDOM TOOTH EXTRACTION  2012    FAMILY HISTORY: Family History  Problem Relation Age of Onset   Cancer Mother        Peripheral T cell Lymphoma   Diabetes Father    Diabetes Maternal Grandmother   The patient's mother died at age 35  shortly after her diagnosis of T-cell non-Hodgkin's lymphoma. The patient's father is 49 years old as of December 2020. He has a history of diabetes, is a bilateral amputee, and has a history of EtOH abuse. The patient has 2 half siblings on her mother side and 3/2 siblings on her father's side. None of them have a history of cancer. She has a maternal cousin with a history of B-cell non-Hodgkin's lymphoma and a maternal grandfather with prostate cancer   GYNECOLOGIC HISTORY:  Patient's last menstrual period was 10/26/2018. Menarche: 40 years old Age at first live birth: 63 years old Grandfather P 4 LMP December 2020 (when goserelin started Contraceptive: Status post bilateral tubal ligation HRT n/a  Hysterectomy? no BSO? no   SOCIAL HISTORY: (updated 04/2020)  [name] is divorced. Her former husband Emerlyn Mehlhoff is a Armed forces technical officer for the [employer]. 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  She works on Friday Saturdays and Sundays only.  Her daughters are called Rhodia Albright, and Sharol Given, age 32-9 as of December 2021. They stay 1 week with the patient in 1 week with her ex-husband.  The patient also has a burn doodle called Karlene Lineman. The patient is a Psychologist, forensic    ADVANCED DIRECTIVES: Not in place. At the 05/06/2020 visit the patient was given the appropriate documents to complete and notarized at her discretion.   HEALTH MAINTENANCE: Social History   Tobacco Use   Smoking status: Never   Smokeless tobacco: Never  Vaping Use   Vaping Use: Never used  Substance Use Topics   Alcohol use: Not Currently    Comment: social   Drug use: Never     Colonoscopy: n/a (age)  PAP: Up-to-date  Bone density: Remote   No Known Allergies  Current Outpatient Medications  Medication Sig Dispense Refill   b complex vitamins capsule Take 1 capsule by mouth daily.     Calcium Carb-Cholecalciferol (CALCIUM 600 + D PO) Take 4 tablets by mouth daily.     capecitabine (XELODA) 500 MG tablet Take 3  tablets (1,500 mg total) by mouth 2 (two) times daily after a meal. Take on days 1-14. Repeat every 21 days. 84 tablet 4   Cholecalciferol (VITAMIN D3) 25 MCG (1000 UT) CAPS Take 3 capsules by mouth daily.     Denosumab (XGEVA [place]) Inject into the skin every 30 (thirty) days.     Goserelin Acetate (ZOLADEX [place]) Inject into the skin every 30 (thirty) days.     levothyroxine (SYNTHROID) 112 MCG tablet 1 tablet in the morning on an empty stomach     metFORMIN (GLUCOPHAGE) 1000 MG tablet Take 1,000 mg by mouth daily.     omeprazole (PRILOSEC) 40 MG capsule Take 1 capsule (40 mg total) by mouth at bedtime. 30 capsule 1   ondansetron (ZOFRAN) 8 MG tablet Take 1 tablet (8 mg total) by mouth 3 (three) times daily as needed for nausea or vomiting. 18 tablet 1   potassium chloride (KLOR-CON) 10 MEQ tablet Take 1 tablet (10 mEq total) by mouth daily. 69 tablet 3   predniSONE (DELTASONE) 20 MG tablet Take 1 tablet (20 mg total) by mouth daily with breakfast. 60 tablet 0   prochlorperazine (COMPAZINE) 10 MG tablet Take 1 tablet (10 mg total) by mouth every 6 (six) hours as needed for nausea or vomiting. 30 tablet 0   valACYclovir (VALTREX) 500 MG tablet Take 1 tablet (500 mg total) by mouth daily. (Patient not taking: Reported on 01/08/2021) 90 tablet 4   No current facility-administered medications for this visit.   Facility-Administered Medications Ordered in Other Visits  Medication Dose Route Frequency Provider Last Rate Last Admin   Darbepoetin Alfa (ARANESP) injection 500 mcg  500 mcg Subcutaneous Once Causey, Charlestine Massed, NP       denosumab (XGEVA) injection 120 mg  120 mg Subcutaneous Once Rashaad Hallstrom, Virgie Dad, MD       goserelin (ZOLADEX) injection 3.6 mg  3.6 mg Subcutaneous Once Cleotis Sparr, Virgie Dad, MD        OBJECTIVE: African-American woman who appears stated age  56:   03/02/21 1602  BP: 116/67  Pulse: 84  Resp: 18  Temp: (!) 97.3 F (36.3 C)  SpO2: 100%       Body mass index is  29.64 kg/m.   Wt Readings from Last 3 Encounters:  03/02/21 178 lb 2 oz (80.8 kg)  02/21/21 180 lb (81.6 kg)  02/09/21 177 lb 4.8 oz (80.4 kg)     ECOG FS:1 - Symptomatic but completely ambulatory  Sclerae unicteric, EOMs intact No cervical or supraclavicular adenopathy Lungs no rales or rhonchi Heart regular  rate and rhythm Abd soft, nontender, positive bowel sounds MSK no focal spinal tenderness, no upper extremity lymphedema Neuro: nonfocal, well oriented, positive affect Breasts: Deferred   LAB RESULTS:  CMP     Component Value Date/Time   NA 138 03/02/2021 1554   NA 140 04/16/2020 0000   K 3.4 (L) 03/02/2021 1554   CL 105 03/02/2021 1554   CO2 23 03/02/2021 1554   GLUCOSE 97 03/02/2021 1554   BUN 9 03/02/2021 1554   BUN 18 04/16/2020 0000   CREATININE 0.76 03/02/2021 1554   CREATININE 0.79 02/23/2021 0851   CALCIUM 9.0 03/02/2021 1554   PROT 6.7 03/02/2021 1554   ALBUMIN 3.3 (L) 03/02/2021 1554   AST 67 (H) 03/02/2021 1554   AST 52 (H) 02/23/2021 0851   ALT 41 03/02/2021 1554   ALT 43 02/23/2021 0851   ALKPHOS 227 (H) 03/02/2021 1554   BILITOT 2.4 (H) 03/02/2021 1554   BILITOT 2.6 (H) 02/23/2021 0851   GFRNONAA >60 03/02/2021 1554   GFRNONAA >60 02/23/2021 0851   GFRAA >60 04/28/2019 1218    No results found for: TOTALPROTELP, ALBUMINELP, A1GS, A2GS, BETS, BETA2SER, GAMS, MSPIKE, SPEI  Lab Results  Component Value Date   WBC 5.3 03/02/2021   NEUTROABS 2.4 03/02/2021   HGB 7.0 (L) 03/02/2021   HCT 22.9 (L) 03/02/2021   MCV 100.4 (H) 03/02/2021   PLT 134 (L) 03/02/2021    No results found for: LABCA2  No components found for: GEXBMW413  No results for input(s): INR in the last 168 hours.   No results found for: LABCA2  No results found for: KGM010  No results found for: UVO536  No results found for: UYQ034  Lab Results  Component Value Date   CA2729 26.8 02/04/2021    No components found for: HGQUANT  No results found for: CEA1 /  No results found for: CEA1   No results found for: AFPTUMOR  No results found for: CHROMOGRNA  No results found for: KPAFRELGTCHN, LAMBDASER, KAPLAMBRATIO (kappa/lambda light chains)  No results found for: HGBA, HGBA2QUANT, HGBFQUANT, HGBSQUAN (Hemoglobinopathy evaluation)   Lab Results  Component Value Date   LDH 1,089 (H) 03/02/2021    Lab Results  Component Value Date   IRON 176 (H) 01/14/2021   TIBC 176 (L) 01/14/2021   IRONPCTSAT 100 (H) 01/14/2021   (Iron and TIBC)  Lab Results  Component Value Date   FERRITIN 2,994 (H) 01/14/2021    Urinalysis    Component Value Date/Time   COLORURINE YELLOW 02/21/2021 [place] 02/21/2021 1237   LABSPEC 1.015 02/21/2021 1237   PHURINE 6.0 02/21/2021 [place] 02/21/2021 [place] 02/21/2021 [place] 02/21/2021 [place] 02/21/2021 [place] 02/21/2021 1237   NITRITE NEGATIVE 02/21/2021 1237   LEUKOCYTESUR NEGATIVE 02/21/2021 1237    STUDIES: DG Chest 2 View  Result Date: 02/21/2021 CLINICAL DATA:  40 year old female with cough. EXAM: CHEST - 2 VIEW COMPARISON:  04/17/2019 FINDINGS: The mediastinal contours are within normal limits. No cardiomegaly. The lungs are clear bilaterally without evidence of focal consolidation, pleural effusion, or pneumothorax. No acute osseous abnormality. IMPRESSION: No acute cardiopulmonary process. Electronically Signed   By: Ruthann Cancer M.D.   On: 02/21/2021 11:20   US Abdomen Limited  Result Date: 02/10/2021 CLINICAL DATA:  Known history of metastatic breast carcinoma EXAM: ULTRASOUND ABDOMEN LIMITED RIGHT UPPER QUADRANT COMPARISON:  MRI from 01/12/2021 FINDINGS: Gallbladder:  Predominately decompressed. No wall thickening is noted. No cholelithiasis is noted. Common bile duct: Diameter: 2 mm Liver: Heterogeneity of the liver is noted. The previously seen lesions on MRI are less well appreciated  on the ultrasound examination but the diffuse heterogeneous nature corresponds with that seen on prior exams. Portal vein is patent on color Doppler imaging with normal direction of blood flow towards the liver. Other: None. IMPRESSION: Heterogeneous liver consistent with the known history of metastatic breast carcinoma. No biliary ductal dilatation is seen. Electronically Signed   By: Inez Catalina M.D.   On: 02/10/2021 16:14     ELIGIBLE FOR AVAILABLE RESEARCH PROTOCOL: no  ASSESSMENT: 40 y.o. [place] woman presenting NOV 2021 with stage IV breast cancer as follows:  (a) workup of initial pancytopenia and hemolysis showed a leukoerythroblastic blood picture with bone marrow biopsy 05/07/2020 confirming metastatic carcinoma, estrogen receptor positive; cytogenetics were normal  (b) CT scans at baseline showing multiple bone lesions (both sclerotic and lytic) and a possible mass in the right breast; no definitive lung or liver involvement  (c) right breast upper outer quadrant biopsy x2 and right axillary lymph node biopsy on 05/18/2019 confirmed a clinical 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, grade 1-2, estrogen receptor strongly positive, progesterone receptor moderately positive (1%), HER-2 negative, with an MIB-1 of 2-15%  (d) CA 27-29 was not informative (repeat 05/06/2020 WNL)  (1) letrozole started 05/28/2019  (a) goserelin started 06/07/2019, repeated every 4 weeks  (b) palbociclib added 06/09/2019  (c) palbociclib dose decreased to 100 mg daily, 21/7, with September 2021 cycle  (d) letrozole and palbociclib discontinued 01/15/2021, with evidence of disease progression  (e) goserelin continued  (2) denosumab/Xgeva started 06/07/2019, repeated every 4 weeks  (3) genetics testing through the [employer] MyRisk panel dated 09/22/2019 found no deleterious mutations in the genes tested which included BRCA 1 and 2, CHEK2, PALB 2 and TP53 among others.  (a) variants of uncertain  significance were found in ATM, AXN 2, and [place] 3.  (4) restaging studies:  (a) chest CT and bone scan 05/21/2020 shows no visceral disease, stable bone lesions  (b) bilateral mammography and right breast ultrasonography 06/25/2020 shows the measurable disease in the right breast to have decreased.  There is some progressive calcifications that require monitoring  (c) bone scan 11/21/2020 shows no new bone lesions  (d) breast ultrasound 11/12/2020 shows the previously noted 10:00 mass to have resolved.  The right axillary adenopathy also has resolved.  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  Her disease appears to  be responding to capecitabine and she will start her next cycle 03/05/2021.  She already has the medication on hand.  She will receive her goserelin and denosumab/Xgeva today, with her next dose due 03/30/2020.  I gave her the results of her lab  work.  We are still not seeing improvement in the hemoglobin although she is remarkably asymptomatic from this.  The reticulocyte count seemed improved so I am hopeful there will be a turnaround sometime within the next few weeks  She is planning to move her care to [facility].  Just in case there is an unexpected delay for any reason I have made a return appointment here in 4 weeks for her next labs and shots.  She understands that she does not need to return if she has established herself at [facility] as is her plan before that date  Total encounter time 25 minutes.Sarajane Jews C. Paras Kreider, MD 03/02/21 5:06 PM Medical Oncology and Hematology [facility] [place], [place] [address] Tel. [phone]    Fax. [phone]   I, Wilburn Mylar, am acting as scribe for Dr. Virgie Dad. Omolara [name].  I, Lurline Del MD, have reviewed the above documentation for accuracy and completeness, and I agree with the above.   *Total Encounter Time as defined by the Centers for Medicare and Medicaid Services includes, in addition to the face-to-face time of a patient visit (documented in the note above) non-face-to-face time: obtaining and reviewing outside history, ordering and reviewing medications, tests or procedures, care coordination (communications with other health care professionals or caregivers) and documentation in the medical record.

## 2021-03-02 ENCOUNTER — Inpatient Hospital Stay: Payer: Commercial Managed Care - PPO

## 2021-03-02 ENCOUNTER — Other Ambulatory Visit: Payer: Self-pay

## 2021-03-02 ENCOUNTER — Inpatient Hospital Stay: Payer: Commercial Managed Care - PPO | Attending: Oncology | Admitting: Oncology

## 2021-03-02 VITALS — BP 116/67 | HR 84 | Temp 97.3°F | Resp 18 | Wt 178.1 lb

## 2021-03-02 DIAGNOSIS — Z170 Estrogen receptor positive status [ER+]: Secondary | ICD-10-CM | POA: Insufficient documentation

## 2021-03-02 DIAGNOSIS — C50411 Malignant neoplasm of upper-outer quadrant of right female breast: Secondary | ICD-10-CM

## 2021-03-02 DIAGNOSIS — D5919 Other autoimmune hemolytic anemia: Secondary | ICD-10-CM

## 2021-03-02 DIAGNOSIS — C7951 Secondary malignant neoplasm of bone: Secondary | ICD-10-CM

## 2021-03-02 DIAGNOSIS — C50811 Malignant neoplasm of overlapping sites of right female breast: Secondary | ICD-10-CM | POA: Diagnosis not present

## 2021-03-02 DIAGNOSIS — C50911 Malignant neoplasm of unspecified site of right female breast: Secondary | ICD-10-CM | POA: Diagnosis present

## 2021-03-02 DIAGNOSIS — E0500 Thyrotoxicosis with diffuse goiter without thyrotoxic crisis or storm: Secondary | ICD-10-CM

## 2021-03-02 DIAGNOSIS — C787 Secondary malignant neoplasm of liver and intrahepatic bile duct: Secondary | ICD-10-CM | POA: Diagnosis not present

## 2021-03-02 DIAGNOSIS — D630 Anemia in neoplastic disease: Secondary | ICD-10-CM | POA: Insufficient documentation

## 2021-03-02 DIAGNOSIS — C7952 Secondary malignant neoplasm of bone marrow: Secondary | ICD-10-CM

## 2021-03-02 DIAGNOSIS — D582 Other hemoglobinopathies: Secondary | ICD-10-CM

## 2021-03-02 LAB — COMPREHENSIVE METABOLIC PANEL
ALT: 41 U/L (ref 0–44)
AST: 67 U/L — ABNORMAL HIGH (ref 15–41)
Albumin: 3.3 g/dL — ABNORMAL LOW (ref 3.5–5.0)
Alkaline Phosphatase: 227 U/L — ABNORMAL HIGH (ref 38–126)
Anion gap: 10 (ref 5–15)
BUN: 9 mg/dL (ref 6–20)
CO2: 23 mmol/L (ref 22–32)
Calcium: 9 mg/dL (ref 8.9–10.3)
Chloride: 105 mmol/L (ref 98–111)
Creatinine, Ser: 0.76 mg/dL (ref 0.44–1.00)
GFR, Estimated: >60 mL/min (ref >60)
Glucose, Bld: 97 mg/dL (ref 70–99)
Potassium: 3.4 mmol/L — ABNORMAL LOW (ref 3.5–5.1)
Sodium: 138 mmol/L (ref 135–145)
Total Bilirubin: 2.4 mg/dL — ABNORMAL HIGH (ref 0.3–1.2)
Total Protein: 6.7 g/dL (ref 6.5–8.1)

## 2021-03-02 LAB — CBC WITH DIFFERENTIAL/PLATELET
Abs Immature Granulocytes: 0.13 10*3/uL — ABNORMAL HIGH (ref 0.00–0.07)
Basophils Absolute: 0 10*3/uL (ref 0.0–0.1)
Basophils Relative: 1 %
Eosinophils Absolute: 0 10*3/uL (ref 0.0–0.5)
Eosinophils Relative: 1 %
HCT: 22.9 % — ABNORMAL LOW (ref 36.0–46.0)
Hemoglobin: 7 g/dL — ABNORMAL LOW (ref 12.0–15.0)
Immature Granulocytes: 2 %
Lymphocytes Relative: 39 %
Lymphs Abs: 2.1 10*3/uL (ref 0.7–4.0)
MCH: 30.7 pg (ref 26.0–34.0)
MCHC: 30.6 g/dL (ref 30.0–36.0)
MCV: 100.4 fL — ABNORMAL HIGH (ref 80.0–100.0)
Monocytes Absolute: 0.7 10*3/uL (ref 0.1–1.0)
Monocytes Relative: 13 %
Neutro Abs: 2.4 10*3/uL (ref 1.7–7.7)
Neutrophils Relative %: 44 %
Platelets: 134 10*3/uL — ABNORMAL LOW (ref 150–400)
RBC: 2.28 MIL/uL — ABNORMAL LOW (ref 3.87–5.11)
RDW: 28.3 % — ABNORMAL HIGH (ref 11.5–15.5)
WBC: 5.3 10*3/uL (ref 4.0–10.5)
nRBC: 11.6 % — ABNORMAL HIGH (ref 0.0–0.2)

## 2021-03-02 LAB — RETICULOCYTES
Immature Retic Fract: 40.8 % — ABNORMAL HIGH (ref 2.3–15.9)
RBC.: 2.27 MIL/uL — ABNORMAL LOW (ref 3.87–5.11)
Retic Count, Absolute: 98.5 10*3/uL (ref 19.0–186.0)
Retic Ct Pct: 4.3 % — ABNORMAL HIGH (ref 0.4–3.1)

## 2021-03-02 LAB — SAVE SMEAR(SSMR), FOR PROVIDER SLIDE REVIEW

## 2021-03-02 LAB — LACTATE DEHYDROGENASE: LDH: 1089 U/L — ABNORMAL HIGH (ref 98–192)

## 2021-03-02 MED ORDER — DENOSUMAB 120 MG/1.7ML ~~LOC~~ SOLN
120.0000 mg | Freq: Once | SUBCUTANEOUS | Status: AC
  Administered 2021-03-02: 120 mg via SUBCUTANEOUS
  Filled 2021-03-02: qty 1.7

## 2021-03-02 MED ORDER — GOSERELIN ACETATE 3.6 MG ~~LOC~~ IMPL
DRUG_IMPLANT | SUBCUTANEOUS | Status: AC
  Filled 2021-03-02: qty 3.6

## 2021-03-02 MED ORDER — GOSERELIN ACETATE 3.6 MG ~~LOC~~ IMPL
3.6000 mg | DRUG_IMPLANT | Freq: Once | SUBCUTANEOUS | Status: AC
  Administered 2021-03-02: 3.6 mg via SUBCUTANEOUS

## 2021-03-02 MED ORDER — DARBEPOETIN ALFA 500 MCG/ML IJ SOSY
500.0000 ug | PREFILLED_SYRINGE | Freq: Once | INTRAMUSCULAR | Status: AC
  Administered 2021-03-02: 500 ug via SUBCUTANEOUS
  Filled 2021-03-02: qty 1

## 2021-03-03 ENCOUNTER — Encounter: Payer: Self-pay | Admitting: Oncology

## 2021-03-20 ENCOUNTER — Telehealth: Payer: Self-pay | Admitting: Oncology

## 2021-03-20 NOTE — Telephone Encounter (Signed)
Rescheduled per 10/21 pt request, pt confirmed new appt

## 2021-03-24 ENCOUNTER — Encounter: Payer: Self-pay | Admitting: Oncology

## 2021-03-24 NOTE — Telephone Encounter (Signed)
No entry 

## 2021-03-30 ENCOUNTER — Inpatient Hospital Stay: Payer: Commercial Managed Care - PPO

## 2021-04-01 ENCOUNTER — Other Ambulatory Visit: Payer: Self-pay

## 2021-04-01 ENCOUNTER — Inpatient Hospital Stay: Payer: Commercial Managed Care - PPO

## 2021-04-01 ENCOUNTER — Telehealth: Payer: Self-pay | Admitting: *Deleted

## 2021-04-01 ENCOUNTER — Inpatient Hospital Stay: Payer: Commercial Managed Care - PPO | Attending: Oncology

## 2021-04-01 ENCOUNTER — Other Ambulatory Visit: Payer: Self-pay | Admitting: Oncology

## 2021-04-01 VITALS — BP 105/66 | HR 84 | Temp 97.7°F | Resp 18

## 2021-04-01 DIAGNOSIS — C50811 Malignant neoplasm of overlapping sites of right female breast: Secondary | ICD-10-CM

## 2021-04-01 DIAGNOSIS — C50411 Malignant neoplasm of upper-outer quadrant of right female breast: Secondary | ICD-10-CM | POA: Diagnosis present

## 2021-04-01 DIAGNOSIS — Z170 Estrogen receptor positive status [ER+]: Secondary | ICD-10-CM | POA: Insufficient documentation

## 2021-04-01 DIAGNOSIS — D6481 Anemia due to antineoplastic chemotherapy: Secondary | ICD-10-CM | POA: Insufficient documentation

## 2021-04-01 DIAGNOSIS — D582 Other hemoglobinopathies: Secondary | ICD-10-CM

## 2021-04-01 DIAGNOSIS — C7951 Secondary malignant neoplasm of bone: Secondary | ICD-10-CM

## 2021-04-01 DIAGNOSIS — E0500 Thyrotoxicosis with diffuse goiter without thyrotoxic crisis or storm: Secondary | ICD-10-CM

## 2021-04-01 DIAGNOSIS — D5919 Other autoimmune hemolytic anemia: Secondary | ICD-10-CM

## 2021-04-01 DIAGNOSIS — C787 Secondary malignant neoplasm of liver and intrahepatic bile duct: Secondary | ICD-10-CM | POA: Insufficient documentation

## 2021-04-01 LAB — COMPREHENSIVE METABOLIC PANEL
ALT: 32 U/L (ref 0–44)
AST: 48 U/L — ABNORMAL HIGH (ref 15–41)
Albumin: 3.2 g/dL — ABNORMAL LOW (ref 3.5–5.0)
Alkaline Phosphatase: 275 U/L — ABNORMAL HIGH (ref 38–126)
Anion gap: 8 (ref 5–15)
BUN: 7 mg/dL (ref 6–20)
CO2: 24 mmol/L (ref 22–32)
Calcium: 8.3 mg/dL — ABNORMAL LOW (ref 8.9–10.3)
Chloride: 109 mmol/L (ref 98–111)
Creatinine, Ser: 0.65 mg/dL (ref 0.44–1.00)
GFR, Estimated: >60 mL/min (ref >60)
Glucose, Bld: 128 mg/dL — ABNORMAL HIGH (ref 70–99)
Potassium: 4 mmol/L (ref 3.5–5.1)
Sodium: 141 mmol/L (ref 135–145)
Total Bilirubin: 1.4 mg/dL — ABNORMAL HIGH (ref 0.3–1.2)
Total Protein: 6.7 g/dL (ref 6.5–8.1)

## 2021-04-01 LAB — CBC WITH DIFFERENTIAL/PLATELET
Abs Immature Granulocytes: 0.06 10*3/uL (ref 0.00–0.07)
Basophils Absolute: 0 10*3/uL (ref 0.0–0.1)
Basophils Relative: 0 %
Eosinophils Absolute: 0.1 10*3/uL (ref 0.0–0.5)
Eosinophils Relative: 1 %
HCT: 24.1 % — ABNORMAL LOW (ref 36.0–46.0)
Hemoglobin: 7.4 g/dL — ABNORMAL LOW (ref 12.0–15.0)
Immature Granulocytes: 1 %
Lymphocytes Relative: 34 %
Lymphs Abs: 1.6 10*3/uL (ref 0.7–4.0)
MCH: 31.4 pg (ref 26.0–34.0)
MCHC: 30.7 g/dL (ref 30.0–36.0)
MCV: 102.1 fL — ABNORMAL HIGH (ref 80.0–100.0)
Monocytes Absolute: 0.4 10*3/uL (ref 0.1–1.0)
Monocytes Relative: 10 %
Neutro Abs: 2.5 10*3/uL (ref 1.7–7.7)
Neutrophils Relative %: 54 %
Platelets: 132 10*3/uL — ABNORMAL LOW (ref 150–400)
RBC: 2.36 MIL/uL — ABNORMAL LOW (ref 3.87–5.11)
RDW: 25.8 % — ABNORMAL HIGH (ref 11.5–15.5)
WBC: 4.6 10*3/uL (ref 4.0–10.5)
nRBC: 1.7 % — ABNORMAL HIGH (ref 0.0–0.2)

## 2021-04-01 LAB — RETICULOCYTES
Immature Retic Fract: 35.9 % — ABNORMAL HIGH (ref 2.3–15.9)
RBC.: 2.35 MIL/uL — ABNORMAL LOW (ref 3.87–5.11)
Retic Count, Absolute: 38.8 10*3/uL (ref 19.0–186.0)
Retic Ct Pct: 1.7 % (ref 0.4–3.1)

## 2021-04-01 LAB — SAVE SMEAR(SSMR), FOR PROVIDER SLIDE REVIEW

## 2021-04-01 MED ORDER — DARBEPOETIN ALFA 500 MCG/ML IJ SOSY
500.0000 ug | PREFILLED_SYRINGE | Freq: Once | INTRAMUSCULAR | Status: AC
  Administered 2021-04-01: 500 ug via SUBCUTANEOUS
  Filled 2021-04-01: qty 1

## 2021-04-01 MED ORDER — GOSERELIN ACETATE 3.6 MG ~~LOC~~ IMPL
3.6000 mg | DRUG_IMPLANT | Freq: Once | SUBCUTANEOUS | Status: AC
  Administered 2021-04-01: 3.6 mg via SUBCUTANEOUS
  Filled 2021-04-01: qty 3.6

## 2021-04-01 MED ORDER — DENOSUMAB 120 MG/1.7ML ~~LOC~~ SOLN
120.0000 mg | Freq: Once | SUBCUTANEOUS | Status: AC
  Administered 2021-04-01: 120 mg via SUBCUTANEOUS
  Filled 2021-04-01: qty 1.7

## 2021-04-01 NOTE — Telephone Encounter (Signed)
Per MD may proceed with treatment today with noted calcium of 8.3- pt instructed of use of OTC calcium replacement use.

## 2021-04-01 NOTE — Patient Instructions (Addendum)
Goserelin injection What is this medication? GOSERELIN (GOE se rel in) is similar to a hormone found in the body. It lowers the amount of sex hormones that the body makes. Men will have lower testosterone levels and women will have lower estrogen levels while taking this medicine. In men, this medicine is used to treat prostate cancer; the injection is either given once per month or once every 12 weeks. A once per month injection (only) is used to treat women with endometriosis, dysfunctional uterine bleeding, or advanced breast cancer. This medicine may be used for other purposes; ask your health care provider or pharmacist if you have questions. COMMON BRAND NAME(S): Zoladex What should I tell my care team before I take this medication? They need to know if you have any of these conditions: bone problems diabetes heart disease history of irregular heartbeat an unusual or allergic reaction to goserelin, other medicines, foods, dyes, or preservatives pregnant or trying to get pregnant breast-feeding How should I use this medication? This medicine is for injection under the skin. It is given by a health care professional in a hospital or clinic setting. Talk to your pediatrician regarding the use of this medicine in children. Special care may be needed. Overdosage: If you think you have taken too much of this medicine contact a poison control center or emergency room at once. NOTE: This medicine is only for you. Do not share this medicine with others. What if I miss a dose? It is important not to miss your dose. Call your doctor or health care professional if you are unable to keep an appointment. What may interact with this medication? Do not take this medicine with any of the following medications: cisapride dronedarone pimozide thioridazine This medicine may also interact with the following medications: other medicines that prolong the QT interval (an abnormal heart rhythm) This list  may not describe all possible interactions. Give your health care provider a list of all the medicines, herbs, non-prescription drugs, or dietary supplements you use. Also tell them if you smoke, drink alcohol, or use illegal drugs. Some items may interact with your medicine. What should I watch for while using this medication? Visit your doctor or health care provider for regular checks on your progress. Your symptoms may appear to get worse during the first weeks of this therapy. Tell your doctor or healthcare provider if your symptoms do not start to get better or if they get worse after this time. Your bones may get weaker if you take this medicine for a long time. If you smoke or frequently drink alcohol you may increase your risk of bone loss. A family history of osteoporosis, chronic use of drugs for seizures (convulsions), or corticosteroids can also increase your risk of bone loss. Talk to your doctor about how to keep your bones strong. This medicine should stop regular monthly menstruation in women. Tell your doctor if you continue to menstruate. Women should not become pregnant while taking this medicine or for 12 weeks after stopping this medicine. Women should inform their doctor if they wish to become pregnant or think they might be pregnant. There is a potential for serious side effects to an unborn child. Talk to your health care professional or pharmacist for more information. Do not breast-feed an infant while taking this medicine. Men should inform their doctors if they wish to father a child. This medicine may lower sperm counts. Talk to your health care professional or pharmacist for more information. This medicine may   increase blood sugar. Ask your healthcare provider if changes in diet or medicines are needed if you have diabetes. What side effects may I notice from receiving this medication? Side effects that you should report to your doctor or health care professional as soon as  possible: allergic reactions like skin rash, itching or hives, swelling of the face, lips, or tongue bone pain breathing problems changes in vision chest pain feeling faint or lightheaded, falls fever, chills pain, swelling, warmth in the leg pain, tingling, numbness in the hands or feet signs and symptoms of high blood sugar such as being more thirsty or hungry or having to urinate more than normal. You may also feel very tired or have blurry vision signs and symptoms of low blood pressure like dizziness; feeling faint or lightheaded, falls; unusually weak or tired stomach pain swelling of the ankles, feet, hands trouble passing urine or change in the amount of urine unusually high or low blood pressure unusually weak or tired Side effects that usually do not require medical attention (report to your doctor or health care professional if they continue or are bothersome): change in sex drive or performance changes in breast size in both males and females changes in emotions or moods headache hot flashes irritation at site where injected loss of appetite skin problems like acne, dry skin vaginal dryness This list may not describe all possible side effects. Call your doctor for medical advice about side effects. You may report side effects to FDA at 1-800-FDA-1088. Where should I keep my medication? This drug is given in a hospital or clinic and will not be stored at home. NOTE: This sheet is a summary. It may not cover all possible information. If you have questions about this medicine, talk to your doctor, pharmacist, or health care provider.  2022 [employer] Standard (2018-09-04 14:05:56)  Darbepoetin Alfa injection What is this medication? DARBEPOETIN ALFA (dar be POE e tin AL fa) helps your body make more red blood cells. It is used to treat anemia caused by chronic kidney failure and chemotherapy. This medicine may be used for other purposes; ask your health care provider  or pharmacist if you have questions. COMMON BRAND NAME(S): Aranesp What should I tell my care team before I take this medication? They need to know if you have any of these conditions: blood clotting disorders or history of blood clots cancer patient not on chemotherapy cystic fibrosis heart disease, such as angina, heart failure, or a history of a heart attack hemoglobin level of 12 g/dL or greater high blood pressure low levels of folate, iron, or vitamin B12 seizures an unusual or allergic reaction to darbepoetin, erythropoietin, albumin, hamster proteins, latex, other medicines, foods, dyes, or preservatives pregnant or trying to get pregnant breast-feeding How should I use this medication? This medicine is for injection into a vein or under the skin. It is usually given by a health care professional in a hospital or clinic setting. If you get this medicine at home, you will be taught how to prepare and give this medicine. Use exactly as directed. Take your medicine at regular intervals. Do not take your medicine more often than directed. It is important that you put your used needles and syringes in a special sharps container. Do not put them in a trash can. If you do not have a sharps container, call your pharmacist or healthcare provider to get one. A special MedGuide will be given to you by the pharmacist with each prescription and refill.  Be sure to read this information carefully each time. Talk to your pediatrician regarding the use of this medicine in children. While this medicine may be used in children as young as 35 month of age for selected conditions, precautions do apply. Overdosage: If you think you have taken too much of this medicine contact a poison control center or emergency room at once. NOTE: This medicine is only for you. Do not share this medicine with others. What if I miss a dose? If you miss a dose, take it as soon as you can. If it is almost time for your next  dose, take only that dose. Do not take double or extra doses. What may interact with this medication? Do not take this medicine with any of the following medications: epoetin alfa This list may not describe all possible interactions. Give your health care provider a list of all the medicines, herbs, non-prescription drugs, or dietary supplements you use. Also tell them if you smoke, drink alcohol, or use illegal drugs. Some items may interact with your medicine. What should I watch for while using this medication? Your condition will be monitored carefully while you are receiving this medicine. You may need blood work done while you are taking this medicine. This medicine may cause a decrease in vitamin B6. You should make sure that you get enough vitamin B6 while you are taking this medicine. Discuss the foods you eat and the vitamins you take with your health care professional. What side effects may I notice from receiving this medication? Side effects that you should report to your doctor or health care professional as soon as possible: allergic reactions like skin rash, itching or hives, swelling of the face, lips, or tongue breathing problems changes in vision chest pain confusion, trouble speaking or understanding feeling faint or lightheaded, falls high blood pressure muscle aches or pains pain, swelling, warmth in the leg rapid weight gain severe headaches sudden numbness or weakness of the face, arm or leg trouble walking, dizziness, loss of balance or coordination seizures (convulsions) swelling of the ankles, feet, hands unusually weak or tired Side effects that usually do not require medical attention (report to your doctor or health care professional if they continue or are bothersome): diarrhea fever, chills (flu-like symptoms) headaches nausea, vomiting redness, stinging, or swelling at site where injected This list may not describe all possible side effects. Call your  doctor for medical advice about side effects. You may report side effects to FDA at 1-800-FDA-1088. Where should I keep my medication? Keep out of the reach of children. Store in a refrigerator between 2 and 8 degrees C (36 and 46 degrees F). Do not freeze. Do not shake. Throw away any unused portion if using a single-dose vial. Throw away any unused medicine after the expiration date. NOTE: This sheet is a summary. It may not cover all possible information. If you have questions about this medicine, talk to your doctor, pharmacist, or health care provider.  2022 [employer] Standard (2017-06-01 16:44:20)  Denosumab injection What is this medication? DENOSUMAB (den oh sue mab) slows bone breakdown. Prolia is used to treat osteoporosis in women after menopause and in men, and in people who are taking corticosteroids for 6 months or more. Delton See is used to treat a high calcium level due to cancer and to prevent bone fractures and other bone problems caused by multiple myeloma or cancer bone metastases. Delton See is also used to treat giant cell tumor of the bone. This medicine  may be used for other purposes; ask your health care provider or pharmacist if you have questions. COMMON BRAND NAME(S): Prolia, XGEVA What should I tell my care team before I take this medication? They need to know if you have any of these conditions: dental disease having surgery or tooth extraction infection kidney disease low levels of calcium or Vitamin D in the blood malnutrition on hemodialysis skin conditions or sensitivity thyroid or parathyroid disease an unusual reaction to denosumab, other medicines, foods, dyes, or preservatives pregnant or trying to get pregnant breast-feeding How should I use this medication? This medicine is for injection under the skin. It is given by a health care professional in a hospital or clinic setting. A special MedGuide will be given to you before each treatment. Be sure to  read this information carefully each time. For Prolia, talk to your pediatrician regarding the use of this medicine in children. Special care may be needed. For Delton See, talk to your pediatrician regarding the use of this medicine in children. While this drug may be prescribed for children as young as 13 years for selected conditions, precautions do apply. Overdosage: If you think you have taken too much of this medicine contact a poison control center or emergency room at once. NOTE: This medicine is only for you. Do not share this medicine with others. What if I miss a dose? It is important not to miss your dose. Call your doctor or health care professional if you are unable to keep an appointment. What may interact with this medication? Do not take this medicine with any of the following medications: other medicines containing denosumab This medicine may also interact with the following medications: medicines that lower your chance of fighting infection steroid medicines like prednisone or cortisone This list may not describe all possible interactions. Give your health care provider a list of all the medicines, herbs, non-prescription drugs, or dietary supplements you use. Also tell them if you smoke, drink alcohol, or use illegal drugs. Some items may interact with your medicine. What should I watch for while using this medication? Visit your doctor or health care professional for regular checks on your progress. Your doctor or health care professional may order blood tests and other tests to see how you are doing. Call your doctor or health care professional for advice if you get a fever, chills or sore throat, or other symptoms of a cold or flu. Do not treat yourself. This drug may decrease your body's ability to fight infection. Try to avoid being around people who are sick. You should make sure you get enough calcium and vitamin D while you are taking this medicine, unless your doctor tells you  not to. Discuss the foods you eat and the vitamins you take with your health care professional. See your dentist regularly. Brush and floss your teeth as directed. Before you have any dental work done, tell your dentist you are receiving this medicine. Do not become pregnant while taking this medicine or for 5 months after stopping it. Talk with your doctor or health care professional about your birth control options while taking this medicine. Women should inform their doctor if they wish to become pregnant or think they might be pregnant. There is a potential for serious side effects to an unborn child. Talk to your health care professional or pharmacist for more information. What side effects may I notice from receiving this medication? Side effects that you should report to your doctor or health care  professional as soon as possible: allergic reactions like skin rash, itching or hives, swelling of the face, lips, or tongue bone pain breathing problems dizziness jaw pain, especially after dental work redness, blistering, peeling of the skin signs and symptoms of infection like fever or chills; cough; sore throat; pain or trouble passing urine signs of low calcium like fast heartbeat, muscle cramps or muscle pain; pain, tingling, numbness in the hands or feet; seizures unusual bleeding or bruising unusually weak or tired Side effects that usually do not require medical attention (report to your doctor or health care professional if they continue or are bothersome): constipation diarrhea headache joint pain loss of appetite muscle pain runny nose tiredness upset stomach This list may not describe all possible side effects. Call your doctor for medical advice about side effects. You may report side effects to FDA at 1-800-FDA-1088. Where should I keep my medication? This medicine is only given in a clinic, doctor's office, or other health care setting and will not be stored at home. NOTE:  This sheet is a summary. It may not cover all possible information. If you have questions about this medicine, talk to your doctor, pharmacist, or health care provider.  2022 [employer] Standard (2017-09-23 16:10:44)

## 2021-04-02 ENCOUNTER — Telehealth: Payer: Self-pay

## 2021-04-02 NOTE — Telephone Encounter (Signed)
NOTES SCANNED TO REFERRAL 

## 2021-04-06 ENCOUNTER — Other Ambulatory Visit: Payer: Commercial Managed Care - PPO

## 2021-04-06 ENCOUNTER — Ambulatory Visit: Payer: Commercial Managed Care - PPO

## 2021-04-07 ENCOUNTER — Other Ambulatory Visit: Payer: Self-pay | Admitting: Oncology

## 2021-04-07 ENCOUNTER — Telehealth: Payer: Self-pay | Admitting: *Deleted

## 2021-04-07 NOTE — Telephone Encounter (Signed)
Breast and Cervical Cancer Medicaid Recertification. Completed form emailed to [email or web address]

## 2021-04-07 NOTE — Progress Notes (Unsigned)
[facility]  [phone] [phone]     ID: [name] [name] DOB: [birth year]  MR#: [id number]  QJJ#:[id number]  Patient Care Team: Townsend Roger, MD as PCP - General (Internal Medicine) Derwood Kaplan, MD as PCP - Hematology/Oncology (Oncology) Roe Koffman, Virgie Dad, MD as Consulting Physician (Oncology) Jacelyn Pi, MD as Referring Physician (Endocrinology) Armandina Gemma, MD as Consulting Physician (General Surgery) Servando Salina, MD as Consulting Physician (Obstetrics and Gynecology) Raina Mina, RPH-CPP (Pharmacist) Harvie Heck, MD as Physician Assistant (Internal Medicine) Chauncey Cruel, MD OTHER MD:  CHIEF COMPLAINT: metastatic breast cancer, estrogen receptor positive  CURRENT TREATMENT: capecitabine, goserelin, denosumab   INTERVAL HISTORY: [name] returns today for follow up of her metastatic breast cancer.    She was switched to Xeloda on 01/21/2021, 14 days on and 7 days off, at standard doses. She has had some pigment changes but only grade 1 palmar plantar erythrodysesthesia so far.  She has not had problems with diarrhea or mouth sores.  She is obtaining the drug no significant cost  She underwent follow up abdomen ultrasound on 02/09/2021, results showed no biliary ductal dilatation.  Her hemoglobin continues to decline slightly, secondary to ineffective erythropoiesis. Lab Results  Component Value Date   HGB 7.4 (L) 04/01/2021   HGB 7.0 (L) 03/02/2021   HGB 7.3 (L) 02/23/2021   HGB 7.6 (L) 02/21/2021   HGB 7.2 (L) 02/16/2021   However her reticulocyte count was better today, at 99.5, and her platelet count also shows a steady upward trend.  White cell count is steady.  REVIEW OF SYSTEMS: [name] tells me she moved to [place] this weekend with the help of a friend.  That friend also suggested that she should move her treatment to [facility] and specifically Dr. Lindon Romp.  Can you does not yet have an  appointment but she tells me it is in the works.  Aside from that she is having some cramps in her hands and feet which sometimes get in the way of her work.  She denies palpitations dizziness feeling faint or falling.  She denies shortness of breath.  She does admit to being fatigued.  She feels better off the Xeloda: This is her off week and she says she has more energy already.  Aside from that a detailed review of systems today was stable  COVID 19 VACCINATION STATUS: Status post Moderna x3, third dose October 2021; booster pending   HISTORY OF CURRENT ILLNESS: From the [facility] [facility] note dated 04/10/2020 by Dickey Gave, PA-C:   "[name] K Pha is a 40 y.o. female African American female with stage IV (T3 N1 M1) hormone receptor positive right breast cancer diagnosed in December 2020. We began seeing in November 2020 for leukocytosis, anemia and thrombocytopenia.  She had bruising and nose bleeds, as well as a 10-15 lb weight loss.  She had been having chronic back pain for several months, for which she was using ibuprofen or Aleve.  LDH was markedly elevated at 2599, reticulocyte count mildly elevated at 3.4% and haptoglobin was less than 10, which was consistent with hemolysis, but Coombs was negative.  CT chest, abdomen and pelvis revealed diffusely sclerotic osseous structures with multiple lytic lesions noted throughout the axial skeleton.  Some lesions demonstrated expansile soft tissue components, and these findings are highly concerning for multiple myeloma or other osseous metastatic disease of uncertain origin.  A spiculated appearing mass or tissue element was also seen in the  lateral right breast, 9 o'clock.  Bone marrow biopsy revealed abundant metastatic carcinoma, which was estrogen receptor positive, so consistent with breast cancer origin.  With that information, it became clear that she had malignant breast cancer that had metastasized to the bone.  She underwent a  digital diagnostic bilateral mammogram with right breast ultrasound in December, which revealed highly suspicious calcifications spanning at least 9.2 cm in the upper outer quadrant of the right breast with distortion associated with the posterior calcifications.  A discrete mass was seen in the right breast with ultrasound at 10 o'clock, 8 cm from the nipple measuring 9 x 6 x 7 mm.  Several mildly abnormal nodes are seen in the right axilla with cortices measuring between 3 and 5 mm.  The lymph nodes were symmetric on mammography and were not definitely different compared to 2015.  Stereotactic needle core biopsy of the right breast and right axilla confirmed grade 2, invasive ductal carcinoma, as well as ductal carcinoma in situ.  The invasive component measured 0.9 cm in greatest linear extent on the core biopsy.  Metastatic carcinoma was involved in one lymph node.  Estrogen receptor positive at 90%, progesterone receptor positive at 1%, and HER2 negative.  Ki67 was 15%.  Nuclear medicine PET scan revealed widespread hypermetabolic bony metastatic disease.  Low level hypermetabolism in bilateral axillary nodes, right greater than left was seen with uptake identified in the ill defined soft tissue lesion in the lateral right breast.  Foci of hypermetabolism identified in the central uterus along the IUD and along the anterior cervix and adjacent small bowel, which were nonspecific.  MRI head did not reveal intracranial metastasis, but diffusely abnormal bone marrow in the skull and cervical spine likely due to metastatic disease was observed.  CA 27-29 was not elevated.   Due to her age of diagnosis, she underwent testing for hereditary breast cancer with the [employer]  myRisk hereditary cancer panel test.  This did not reveal any clinically significant mutation.  There were variants of uncertain significance of the ATM, AXIN2 and MSH3 genes.   She had severer pain, so was placed on methadone, in addition to  oxycodone and the doses were titrated up.  We recommended letrozole/palbociclib, denosumab monthly for the bone metastasis and goserelin monthly to suppress ovarian function due to being premenopausal.  She started letrozole on December 28th and denosumab and goserelin on January 7th.  She started palbociclib for 3 weeks on and one week off on January 9th.   She develops severe hypocalcemia, which required IV calcium replacement. Denosumab had to be held due to the severe hypocalcemia, but was resumed in March.  She was placed on high doses of oral calcium.  She had multiple other issues, such as decreased appetite, nausea and vomiting, as well  As dehydration, managed with medications. MRI thoracic and lumbar spine on January 19th revealed diffusely decreased T1 bone marrow signal with fairly homogeneous enhancement throughout the thoracolumbar spine.  Prior PET-CT demonstrated homogeneous hypermetabolic activity throughout the thoracolumbar spine.  These findings are favored to be related to the patient's anemia rather than diffuse metastatic disease.  There were possible small osseous metastases, notably within the T3 spinous process, left L2 pedicle and upper right sacrum.  There was no significant disc herniation, spinal stenosis or nerve root encroachment.  She was subsequently weaned off methadone and has not had recurrent pain.  She developed cytopenias due to palbociclib and we occasionally had to delay cycles   CT chest,  abdomen and pelvis in May revealed diffuse patchy confluent sclerotic osseous metastatic disease throughout the axial and proximal appendicular skeleton, probably representing treatment effect.  ThereWere healed deformities throughout the bilateral ribs.  A new mild right axillary lymphadenopathy was also observed, which wasnonspecific.  No additional sites of metastatic disease were seen in the chest, abdomen or pelvis.  She had a  tubal ligation and removal of her Mirena IUD in June.   CT chest, abdomen and pelvis in September revealed again widespread osseous sclerosis, indicative of treated metastatic disease.  The previous borderline enlarged right axillary lymph nodes had resolved.  She had recurrent cytopenias, and since we had to delay multiple cycles of palbociclib, we decreased her palbociclib dose to 100 mg daily for 3 weeks on a week off."  The patient's subsequent history is as detailed below.   PAST MEDICAL HISTORY: Past Medical History:  Diagnosis Date   Anemia    History of cardiac murmur as a child    History of Graves' disease    dx hyperthyroidism during 4th pregnancy;   11-15-2013  s/p  total thyroidectomy   Hypothyroidism, postsurgical 11/15/2013   endocrinologist--- dr balan   Pre-diabetes    Stage IV breast cancer in female [facility]) 05/2019   oncologist-- dr c. Hinton Rao ([place]) bone marrow bx 05-08-2019 and right breast bx 05-17-2020--- primary breast w/ mets to bones, pt taking oral chemo (ibrance)  Tumor associated hemolysis at presentation December 2020   PAST SURGICAL HISTORY: Past Surgical History:  Procedure Laterality Date   IUD REMOVAL N/A 11/22/2019   Procedure: INTRAUTERINE DEVICE (IUD) REMOVAL;  Surgeon: Servando Salina, MD;  Location: [facility];  Service: Gynecology;  Laterality: N/A;   LAPAROSCOPIC TUBAL LIGATION Bilateral 11/22/2019   Procedure: LAPAROSCOPIC TUBAL LIGATION By Cautery;  Surgeon: Servando Salina, MD;  Location: [facility];  Service: Gynecology;  Laterality: Bilateral;   THYROIDECTOMY Bilateral 11/15/2013   Procedure: TOTAL THYROIDECTOMY;  Surgeon: Earnstine Regal, MD;  Location: [facility];  Service: General;  Laterality: Bilateral;   WISDOM TOOTH EXTRACTION  2012    FAMILY HISTORY: Family History  Problem Relation Age of Onset   Cancer Mother        Peripheral T cell Lymphoma   Diabetes Father    Diabetes Maternal Grandmother   The patient's mother died at age 35  shortly after her diagnosis of T-cell non-Hodgkin's lymphoma. The patient's father is 49 years old as of December 2020. He has a history of diabetes, is a bilateral amputee, and has a history of EtOH abuse. The patient has 2 half siblings on her mother side and 3/2 siblings on her father's side. None of them have a history of cancer. She has a maternal cousin with a history of B-cell non-Hodgkin's lymphoma and a maternal grandfather with prostate cancer   GYNECOLOGIC HISTORY:  Patient's last menstrual period was 10/26/2018. Menarche: 40 years old Age at first live birth: 63 years old Grandfather P 4 LMP December 2020 (when goserelin started Contraceptive: Status post bilateral tubal ligation HRT n/a  Hysterectomy? no BSO? no   SOCIAL HISTORY: (updated 04/2020)  [name] is divorced. Her former husband Emerlyn Mehlhoff is a Armed forces technical officer for the [employer]. 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  She works on Friday Saturdays and Sundays only.  Her daughters are called Rhodia Albright, and Sharol Given, age 32-9 as of December 2021. They stay 1 week with the patient in 1 week with her ex-husband.  The patient also has a burn doodle called Karlene Lineman. The patient is a Psychologist, forensic    ADVANCED DIRECTIVES: Not in place. At the 05/06/2020 visit the patient was given the appropriate documents to complete and notarized at her discretion.   HEALTH MAINTENANCE: Social History   Tobacco Use   Smoking status: Never   Smokeless tobacco: Never  Vaping Use   Vaping Use: Never used  Substance Use Topics   Alcohol use: Not Currently    Comment: social   Drug use: Never     Colonoscopy: n/a (age)  PAP: Up-to-date  Bone density: Remote   No Known Allergies  Current Outpatient Medications  Medication Sig Dispense Refill   b complex vitamins capsule Take 1 capsule by mouth daily.     Calcium Carb-Cholecalciferol (CALCIUM 600 + D PO) Take 4 tablets by mouth daily.     capecitabine (XELODA) 500 MG tablet Take 3  tablets (1,500 mg total) by mouth 2 (two) times daily after a meal. Take on days 1-14. Repeat every 21 days. 84 tablet 4   Cholecalciferol (VITAMIN D3) 25 MCG (1000 UT) CAPS Take 3 capsules by mouth daily.     Denosumab (XGEVA [place]) Inject into the skin every 30 (thirty) days.     Goserelin Acetate (ZOLADEX [place]) Inject into the skin every 30 (thirty) days.     levothyroxine (SYNTHROID) 112 MCG tablet 1 tablet in the morning on an empty stomach     metFORMIN (GLUCOPHAGE) 1000 MG tablet Take 1,000 mg by mouth daily.     omeprazole (PRILOSEC) 40 MG capsule Take 1 capsule (40 mg total) by mouth at bedtime. 30 capsule 1   ondansetron (ZOFRAN) 8 MG tablet Take 1 tablet (8 mg total) by mouth 3 (three) times daily as needed for nausea or vomiting. 18 tablet 1   potassium chloride (KLOR-CON) 10 MEQ tablet Take 1 tablet (10 mEq total) by mouth daily. 69 tablet 3   predniSONE (DELTASONE) 20 MG tablet Take 1 tablet (20 mg total) by mouth daily with breakfast. 60 tablet 0   prochlorperazine (COMPAZINE) 10 MG tablet Take 1 tablet (10 mg total) by mouth every 6 (six) hours as needed for nausea or vomiting. 30 tablet 0   valACYclovir (VALTREX) 500 MG tablet Take 1 tablet (500 mg total) by mouth daily. (Patient not taking: Reported on 01/08/2021) 90 tablet 4   No current facility-administered medications for this visit.    OBJECTIVE: African-American woman who appears stated age  There were no vitals filed for this visit.      There is no height or weight on file to calculate BMI.   Wt Readings from Last 3 Encounters:  03/02/21 178 lb 2 oz (80.8 kg)  02/21/21 180 lb (81.6 kg)  02/09/21 177 lb 4.8 oz (80.4 kg)     ECOG FS:1 - Symptomatic but completely ambulatory  Sclerae unicteric, EOMs intact No cervical or supraclavicular adenopathy Lungs no rales or rhonchi Heart regular rate and rhythm Abd soft, nontender, positive bowel sounds MSK no focal spinal tenderness, no upper extremity lymphedema Neuro:  nonfocal, well oriented, positive affect Breasts: Deferred   LAB RESULTS:  CMP     Component Value Date/Time   NA 141 04/01/2021 1038   NA 140 04/16/2020 0000   K 4.0 04/01/2021 1038   CL 109 04/01/2021 1038   CO2 24 04/01/2021 1038   GLUCOSE 128 (H) 04/01/2021 1038   BUN 7 04/01/2021 1038   BUN 18 04/16/2020 0000   CREATININE  0.65 04/01/2021 1038   CREATININE 0.79 02/23/2021 0851   CALCIUM 8.3 (L) 04/01/2021 1038   PROT 6.7 04/01/2021 1038   ALBUMIN 3.2 (L) 04/01/2021 1038   AST 48 (H) 04/01/2021 1038   AST 52 (H) 02/23/2021 0851   ALT 32 04/01/2021 1038   ALT 43 02/23/2021 0851   ALKPHOS 275 (H) 04/01/2021 1038   BILITOT 1.4 (H) 04/01/2021 1038   BILITOT 2.6 (H) 02/23/2021 0851   GFRNONAA >60 04/01/2021 1038   GFRNONAA >60 02/23/2021 0851   GFRAA >60 04/28/2019 1218    No results found for: TOTALPROTELP, ALBUMINELP, A1GS, A2GS, BETS, BETA2SER, GAMS, MSPIKE, SPEI  Lab Results  Component Value Date   WBC 4.6 04/01/2021   NEUTROABS 2.5 04/01/2021   HGB 7.4 (L) 04/01/2021   HCT 24.1 (L) 04/01/2021   MCV 102.1 (H) 04/01/2021   PLT 132 (L) 04/01/2021    No results found for: LABCA2  No components found for: EQASTM196  No results for input(s): INR in the last 168 hours.   No results found for: LABCA2  No results found for: QIW979  No results found for: GXQ119  No results found for: ERD408  Lab Results  Component Value Date   CA2729 26.8 02/04/2021    No components found for: HGQUANT  No results found for: CEA1 / No results found for: CEA1   No results found for: AFPTUMOR  No results found for: CHROMOGRNA  No results found for: KPAFRELGTCHN, LAMBDASER, KAPLAMBRATIO (kappa/lambda light chains)  No results found for: HGBA, HGBA2QUANT, HGBFQUANT, HGBSQUAN (Hemoglobinopathy evaluation)   Lab Results  Component Value Date   LDH 1,089 (H) 03/02/2021    Lab Results  Component Value Date   IRON 176 (H) 01/14/2021   TIBC 176 (L) 01/14/2021    IRONPCTSAT 100 (H) 01/14/2021   (Iron and TIBC)  Lab Results  Component Value Date   FERRITIN 2,994 (H) 01/14/2021    Urinalysis    Component Value Date/Time   COLORURINE YELLOW 02/21/2021 [place] 02/21/2021 1237   LABSPEC 1.015 02/21/2021 1237   PHURINE 6.0 02/21/2021 [place] 02/21/2021 [place] 02/21/2021 [place] 02/21/2021 [place] 02/21/2021 [place] 02/21/2021 1237   NITRITE NEGATIVE 02/21/2021 1237   LEUKOCYTESUR NEGATIVE 02/21/2021 1237    STUDIES: No results found.   ELIGIBLE FOR AVAILABLE RESEARCH PROTOCOL: no  ASSESSMENT: 40 y.o. [place] woman presenting NOV 2021 with stage IV breast cancer as follows:  (a) workup of initial pancytopenia and hemolysis showed a leukoerythroblastic blood picture with bone marrow biopsy 05/07/2020 confirming metastatic carcinoma, estrogen receptor positive; cytogenetics were normal  (b) CT scans at baseline showing multiple bone lesions (both sclerotic and lytic) and a possible mass in the right breast; no definitive lung or liver involvement  (c) right breast upper outer quadrant biopsy x2 and right axillary lymph node biopsy on 05/18/2019 confirmed a clinical 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, grade 1-2, estrogen receptor strongly positive, progesterone receptor moderately positive (1%), HER-2 negative, with an MIB-1 of 2-15%  (d) CA 27-29 was not informative (repeat 05/06/2020 WNL)  (1) letrozole started 05/28/2019  (a) goserelin started 06/07/2019, repeated every 4 weeks  (b) palbociclib added 06/09/2019  (c) palbociclib dose decreased to 100 mg daily, 21/7, with September 2021 cycle  (d) letrozole and palbociclib discontinued 01/15/2021, with evidence of disease progression  (e) goserelin continued  (2) denosumab/Xgeva started 06/07/2019,  repeated every 4 weeks  (3) genetics testing through the [employer] MyRisk  panel dated 09/22/2019 found no deleterious mutations in the genes tested which included BRCA 1 and 2, CHEK2, PALB 2 and TP53 among others.  (a) variants of uncertain significance were found in ATM, AXN 2, and [place] 3.  (4) restaging studies:  (a) chest CT and bone scan 05/21/2020 shows no visceral disease, stable bone lesions  (b) bilateral mammography and right breast ultrasonography 06/25/2020 shows the measurable disease in the right breast to have decreased.  There is some progressive calcifications that require monitoring  (c) bone scan 11/21/2020 shows no new bone lesions  (d) breast ultrasound 11/12/2020 shows the previously noted 10:00 mass to have resolved.  The right axillary adenopathy also has resolved.  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  Her disease appears to be responding to capecitabine and she will start her next cycle 03/05/2021.  She  already has the medication on hand.  She will receive her goserelin and denosumab/Xgeva today, with her next dose due 03/30/2020.  I gave her the results of her lab work.  We are still not seeing improvement in the hemoglobin although she is remarkably asymptomatic from this.  The reticulocyte count seemed improved so I am hopeful there will be a turnaround sometime within the next few weeks  She is planning to move her care to [facility].  Just in case there is an unexpected delay for any reason I have made a return appointment here in 4 weeks for her next labs and shots.  She understands that she does not need to return if she has established herself at [facility] as is her plan before that date  Total encounter time 25 minutes.Sarajane Jews C. Payson Crumby, MD 04/07/21 2:34 PM Medical Oncology and Hematology [facility] [place], [place] [address] Tel. [phone]    Fax. [phone]   I, Wilburn Mylar, am acting as scribe for Dr. Virgie Dad. Quenesha Douglass.  I, Lurline Del MD, have reviewed the above documentation for accuracy and completeness, and I agree with the above.   *Total Encounter Time as defined by the Centers for Medicare and Medicaid Services includes, in addition to the face-to-face time of a patient visit (documented in the note above) non-face-to-face time: obtaining and reviewing outside history, ordering and reviewing medications, tests or procedures, care coordination (communications with other health care professionals or caregivers) and documentation in the medical record.

## 2021-04-14 ENCOUNTER — Other Ambulatory Visit: Payer: Self-pay | Admitting: Internal Medicine

## 2021-04-14 DIAGNOSIS — Z853 Personal history of malignant neoplasm of breast: Secondary | ICD-10-CM

## 2021-04-20 ENCOUNTER — Other Ambulatory Visit: Payer: Self-pay | Admitting: Oncology

## 2021-04-20 DIAGNOSIS — C787 Secondary malignant neoplasm of liver and intrahepatic bile duct: Secondary | ICD-10-CM

## 2021-04-20 DIAGNOSIS — C50411 Malignant neoplasm of upper-outer quadrant of right female breast: Secondary | ICD-10-CM

## 2021-04-21 ENCOUNTER — Encounter: Payer: Self-pay | Admitting: Oncology

## 2021-04-22 NOTE — Progress Notes (Signed)
Referring-[name] [name] Reason for referral-dyspnea and lower extremity edema  HPI: 40 year old female for evaluation of dyspnea and lower extremity edema at request of [name] Major [name].  Patient has known metastatic breast cancer (metastatic to liver and multiple osseous mets).  Therapy has included letrozole, goserelin, denosumab/xgeva, palbociclib and capecitabine. Chest x-ray September 2022 showed no acute cardiopulmonary process.  Laboratories November 2022 showed white blood cell count 3.8, hemoglobin 8.6 and platelet count 163.  Creatinine 0.52, albumin 3.7, alkaline phosphatase 253.  Patient has dyspnea with more vigorous activities but not routine activities.  No orthopnea or PND.  Occasional minimal edema left ankle.  No chest pain, palpitations or syncope.  Cardiology now asked to evaluate.  Current Outpatient Medications  Medication Sig Dispense Refill   Calcium Carb-Cholecalciferol (CALCIUM 600 + D PO) Take 4 tablets by mouth daily.     capecitabine (XELODA) 500 MG tablet Take 3 tablets (1,500 mg total) by mouth 2 (two) times daily after a meal. Take on days 1-14. Repeat every 21 days. 84 tablet 4   Cholecalciferol (VITAMIN D3) 25 MCG (1000 UT) CAPS Take 3 capsules by mouth daily.     Denosumab (XGEVA [place]) Inject into the skin every 30 (thirty) days.     Goserelin Acetate (ZOLADEX [place]) Inject into the skin every 30 (thirty) days.     levothyroxine (SYNTHROID) 112 MCG tablet 1 tablet in the morning on an empty stomach     ondansetron (ZOFRAN) 8 MG tablet Take 1 tablet (8 mg total) by mouth 3 (three) times daily as needed for nausea or vomiting. 18 tablet 1   potassium chloride (KLOR-CON) 10 MEQ tablet Take 1 tablet (10 mEq total) by mouth daily. 69 tablet 3   b complex vitamins capsule Take 1 capsule by mouth daily.     metFORMIN (GLUCOPHAGE) 1000 MG tablet Take 1,000 mg by mouth daily.     omeprazole (PRILOSEC) 40 MG capsule Take 1 capsule (40 mg total) by mouth at  bedtime. 30 capsule 1   predniSONE (DELTASONE) 20 MG tablet Take 1 tablet (20 mg total) by mouth daily with breakfast. 60 tablet 0   prochlorperazine (COMPAZINE) 10 MG tablet Take 1 tablet (10 mg total) by mouth every 6 (six) hours as needed for nausea or vomiting. 30 tablet 0   valACYclovir (VALTREX) 500 MG tablet Take 1 tablet (500 mg total) by mouth daily. (Patient not taking: Reported on 01/08/2021) 90 tablet 4   No current facility-administered medications for this visit.    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, pt taking oral chemo (ibrance)    Past Surgical History:  Procedure Laterality Date   IUD REMOVAL N/A 11/22/2019   Procedure: INTRAUTERINE DEVICE (IUD) REMOVAL;  Surgeon: Servando Salina, MD;  Location: [facility];  Service: Gynecology;  Laterality: N/A;   LAPAROSCOPIC TUBAL LIGATION Bilateral 11/22/2019   Procedure: LAPAROSCOPIC TUBAL LIGATION By Cautery;  Surgeon: Servando Salina, MD;  Location: [facility];  Service: Gynecology;  Laterality: Bilateral;   THYROIDECTOMY Bilateral 11/15/2013   Procedure: TOTAL THYROIDECTOMY;  Surgeon: Earnstine Regal, MD;  Location: [facility];  Service: General;  Laterality: Bilateral;   WISDOM TOOTH EXTRACTION  2012    Social History   Socioeconomic History   Marital status: Divorced    Spouse name: Not on file   Number of children: 4   Years of education: Not on file   Highest education level: Not on file  Occupational History   Not on  file  Tobacco Use   Smoking status: Never   Smokeless tobacco: Never  Vaping Use   Vaping Use: Never used  Substance and Sexual Activity   Alcohol use: Not Currently    Comment: social   Drug use: Never   Sexual activity: Yes    Birth control/protection: I.U.D.  Other Topics Concern   Not on file  Social History Narrative   Not on file   Social Determinants of Health   Financial Resource Strain: Low Risk    Difficulty of Paying Living Expenses: Not hard at all  Food Insecurity: No Food Insecurity   Worried About Charity fundraiser in the Last Year: Never true   [place] in the Last Year: Never true  Transportation Needs: No Transportation Needs   Lack of Transportation (Medical): No   Lack of Transportation (Non-Medical): No  Physical Activity: Not on file  Stress: Not on file  Social Connections: Not on file  Intimate Partner Violence: Not on file    Family History  Problem Relation Age of Onset   Cancer Mother        Peripheral T cell Lymphoma   Diabetes Father    Diabetes Maternal Grandmother     ROS: no fevers or chills, productive cough, hemoptysis, dysphasia, odynophagia, melena, hematochezia, dysuria, hematuria, rash, seizure activity, orthopnea, PND, pedal edema, claudication. Remaining systems are negative.  Physical Exam:   Blood pressure 107/72, pulse 76, height 5\' 5"  (1.651 m), weight 181 lb 6.4 oz (82.3 kg), last menstrual period 10/26/2018, SpO2 99 %.  General:  Well developed/well nourished in NAD Skin warm/dry Patient not depressed No peripheral clubbing Back-normal HEENT-normal/normal eyelids Neck supple/normal carotid upstroke bilaterally; no bruits; no JVD; no thyromegaly chest - CTA/ normal expansion CV - RRR/normal S1 and S2; no murmurs, rubs or gallops;  PMI nondisplaced Abdomen -NT/ND, no HSM, no mass, + bowel sounds, no bruit 2+ femoral pulses, no bruits Ext-no edema, chords, 2+ DP Neuro-grossly nonfocal  ECG - 02/01/21; NSR  with no ST changes; personally reviewed  Today's electrocardiogram shows sinus rhythm at a rate of 72 and no ST changes; personally reviewed.  A/P  1 dyspnea-patient has mild dyspnea with more vigorous activities.  We will arrange echocardiogram to assess LV function and to rule out pericardial fusion particular in light of history of metastatic breast cancer.  If normal we will not pursue further cardiac evaluation.  2 lower extremity edema-no significant edema on examination.  As above we will arrange echocardiogram to assess LV function.  3 metastatic breast cancer-follow-up oncology.  Kirk Ruths, MD

## 2021-04-27 ENCOUNTER — Encounter: Payer: Self-pay | Admitting: Oncology

## 2021-05-04 ENCOUNTER — Ambulatory Visit (INDEPENDENT_AMBULATORY_CARE_PROVIDER_SITE_OTHER): Payer: Medicaid Other | Admitting: Cardiology

## 2021-05-04 ENCOUNTER — Other Ambulatory Visit: Payer: Self-pay

## 2021-05-04 ENCOUNTER — Encounter: Payer: Self-pay | Admitting: Cardiology

## 2021-05-04 VITALS — BP 107/72 | HR 76 | Ht 65.0 in | Wt 181.4 lb

## 2021-05-04 DIAGNOSIS — R600 Localized edema: Secondary | ICD-10-CM

## 2021-05-04 DIAGNOSIS — R0602 Shortness of breath: Secondary | ICD-10-CM | POA: Diagnosis not present

## 2021-05-04 NOTE — Patient Instructions (Signed)
  Testing/Procedures:  Your physician has requested that you have an echocardiogram. Echocardiography is a painless test that uses sound waves to create images of your heart. It provides your doctor with information about the size and shape of your heart and how well your heart's chambers and valves are working. This procedure takes approximately one hour. There are no restrictions for this procedure. [address]   Follow-Up: At [facility], you and your health needs are our priority.  As part of our continuing mission to provide you with exceptional heart care, we have created designated Provider Care Teams.  These Care Teams include your primary Cardiologist (physician) and Advanced Practice Providers (APPs -  Physician Assistants and Nurse Practitioners) who all work together to provide you with the care you need, when you need it.  We recommend signing up for the patient portal called "MyChart".  Sign up information is provided on this After Visit Summary.  MyChart is used to connect with patients for Virtual Visits (Telemedicine).  Patients are able to view lab/test results, encounter notes, upcoming appointments, etc.  Non-urgent messages can be sent to your provider as well.   To learn more about what you can do with MyChart, go to [email or web address].    Your next appointment:    AS NEEDED

## 2021-05-18 ENCOUNTER — Encounter: Payer: Self-pay | Admitting: Oncology

## 2021-06-01 ENCOUNTER — Encounter: Payer: Self-pay | Admitting: Oncology

## 2021-06-03 ENCOUNTER — Ambulatory Visit
Admission: RE | Admit: 2021-06-03 | Discharge: 2021-06-03 | Disposition: A | Discharge status: Home / Self Care | Payer: Commercial Managed Care - PPO | Source: Ambulatory Visit | Attending: Internal Medicine | Admitting: Internal Medicine

## 2021-06-03 ENCOUNTER — Other Ambulatory Visit: Payer: Self-pay | Admitting: Internal Medicine

## 2021-06-03 ENCOUNTER — Ambulatory Visit
Admission: RE | Admit: 2021-06-03 | Discharge: 2021-06-03 | Disposition: A | Discharge status: Home / Self Care | Payer: Medicaid Other | Source: Ambulatory Visit | Attending: Internal Medicine | Admitting: Internal Medicine

## 2021-06-03 ENCOUNTER — Other Ambulatory Visit: Payer: Self-pay

## 2021-06-03 DIAGNOSIS — Z853 Personal history of malignant neoplasm of breast: Secondary | ICD-10-CM

## 2021-06-03 IMAGING — MG DIGITAL DIAGNOSTIC BILAT W/ TOMO W/ CAD
8 of 12 series · 8 of 28 positions shown · non-contrast
Comparison: Previous exam(s).

CLINICAL DATA: Stage IV breast cancer in the right breast, treated
with oral chemotherapy. Follow-up calcifications and mass.

EXAM:
DIGITAL DIAGNOSTIC BILATERAL MAMMOGRAM WITH TOMOSYNTHESIS AND CAD;
ULTRASOUND RIGHT BREAST LIMITED
TECHNIQUE: Bilateral digital diagnostic mammography and breast tomosynthesis
was performed. The images were evaluated with computer-aided
detection.; Targeted ultrasound examination of the right breast was
performed

[R ML (1 of 2)]
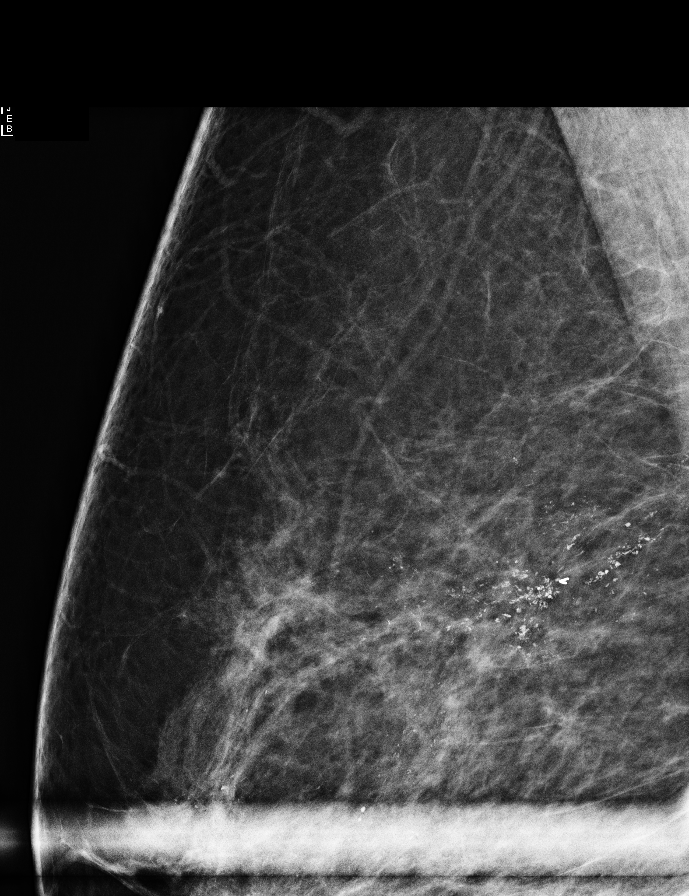

[R CC (1 of 2)]
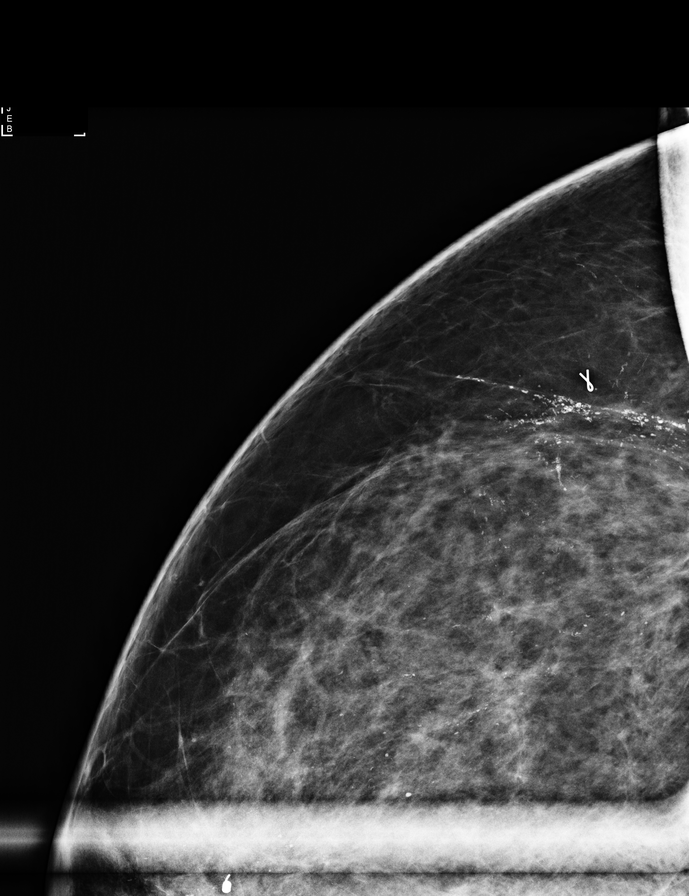

[R ML (2 of 2)]
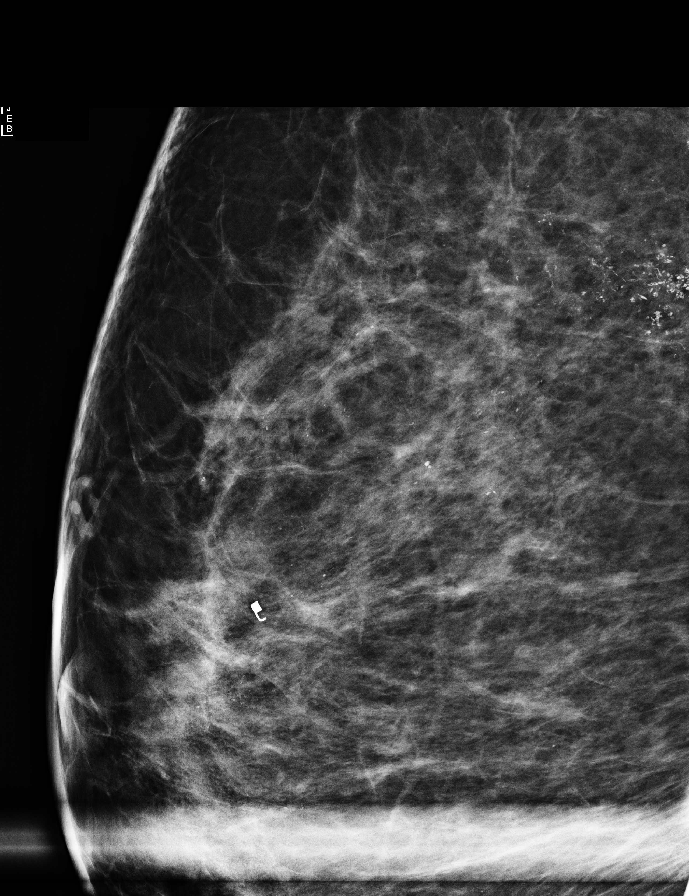

[R CC (2 of 2)]
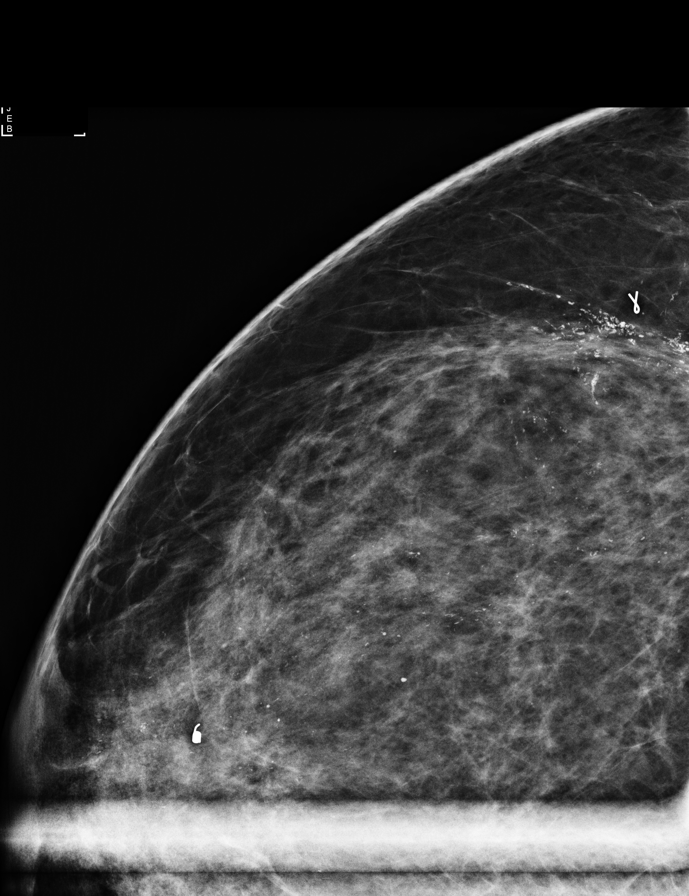

[R CC synth-2D]
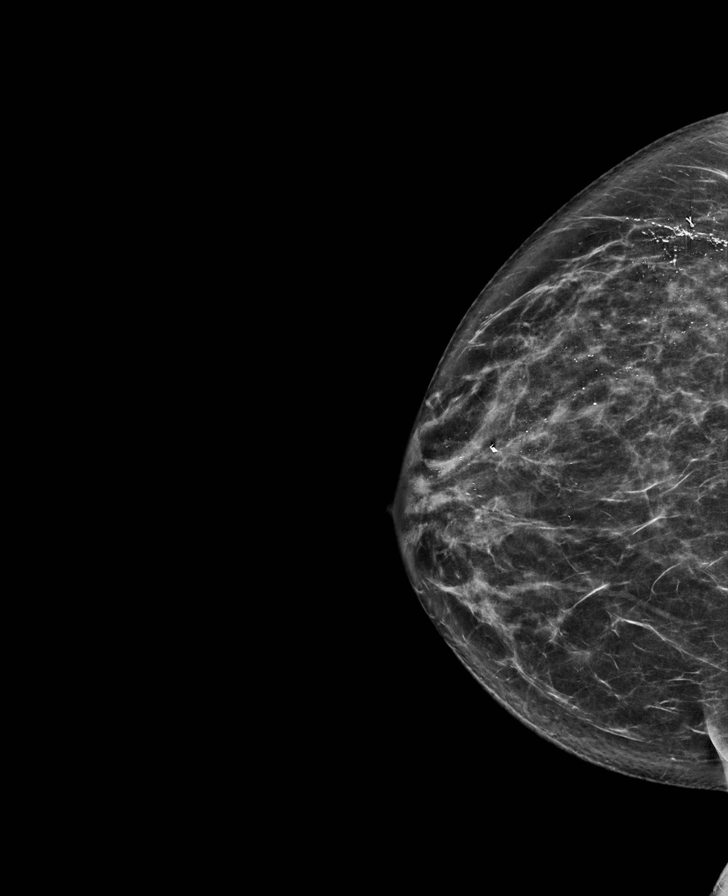

[L MLO synth-2D]
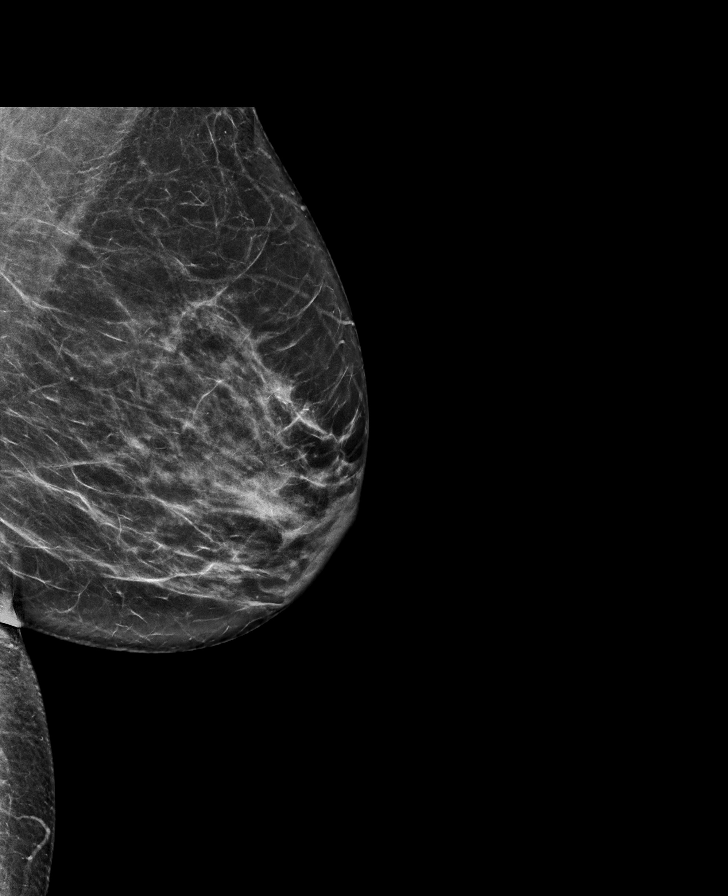

[R MLO synth-2D]
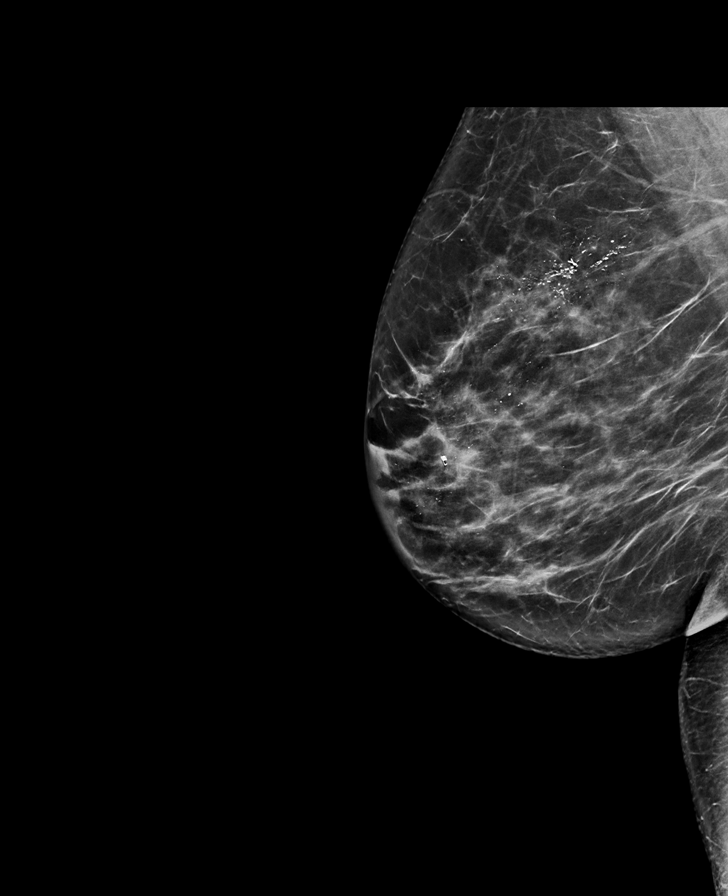

[L CC synth-2D]
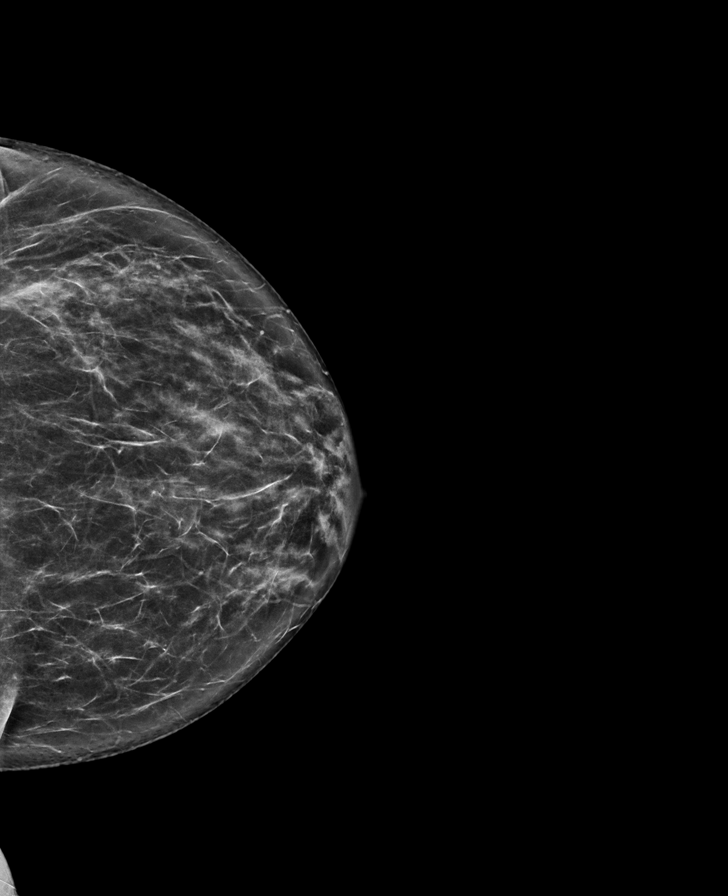

[8 of 28 positions shown; findings below may reference images not displayed]

ACR Breast Density Category b: There are scattered areas of
fibroglandular density.
FINDINGS: The linear and pleomorphic calcifications in the upper outer right
breast extending from posterior to the nipple are not significantly
changed in the interval. No suspicious masses are identified in
either breast.

Targeted ultrasound is performed, showing normal lymph nodes in the
right axilla. The previously identified mass at 10 o'clock in the
right breast is no longer visualized.
IMPRESSION: No interval change. The 10 o'clock right breast mass is no longer
visualized which was also true in [DATE]. The calcifications
in the right breast are stable. The lymph nodes are normal in
appearance. No new sites of malignancy identified.

RECOMMENDATION:
Recommend continued oncologic follow up. Recommend continued
mammography as clinically warranted by the treatment plan. At the
least, annual mammography should be pursued.

I have discussed the findings and recommendations with the patient.
If applicable, a reminder letter will be sent to the patient
regarding the next appointment.

BI-RADS CATEGORY  6: Known biopsy-proven malignancy.

## 2021-06-03 IMAGING — US US BREAST*R* LIMITED INC AXILLA
1 series · 7 of 7 positions shown · non-contrast
Comparison: Previous exam(s).

CLINICAL DATA: Stage IV breast cancer in the right breast, treated
with oral chemotherapy. Follow-up calcifications and mass.

EXAM:
DIGITAL DIAGNOSTIC BILATERAL MAMMOGRAM WITH TOMOSYNTHESIS AND CAD;
ULTRASOUND RIGHT BREAST LIMITED
TECHNIQUE: Bilateral digital diagnostic mammography and breast tomosynthesis
was performed. The images were evaluated with computer-aided
detection.; Targeted ultrasound examination of the right breast was
performed

[Series 1: us breast*right* limited inc axilla · 0.07mm/px · 7 of 7 slices shown]
[im 1/7]
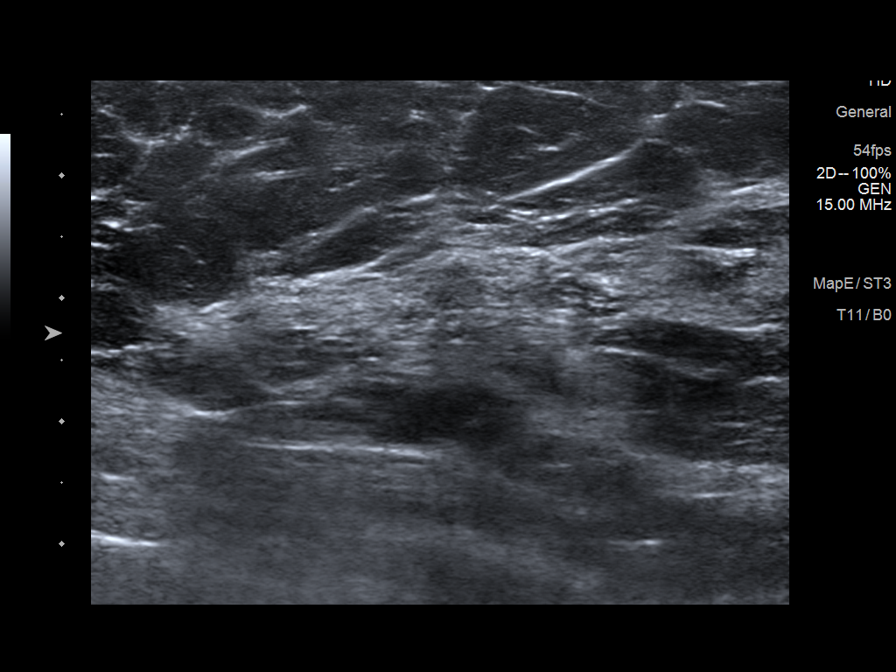
[im 2/7]
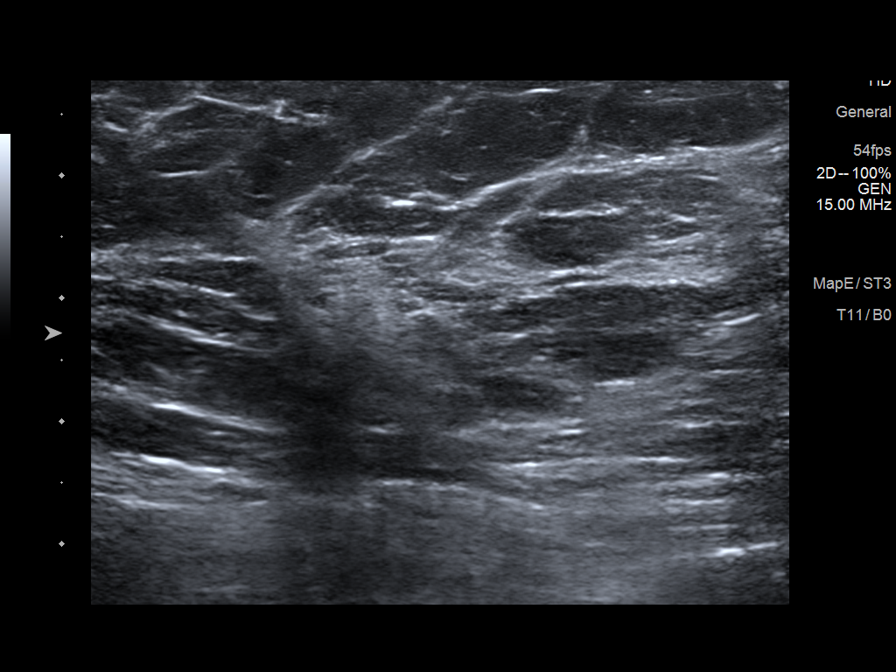
[im 3/7]
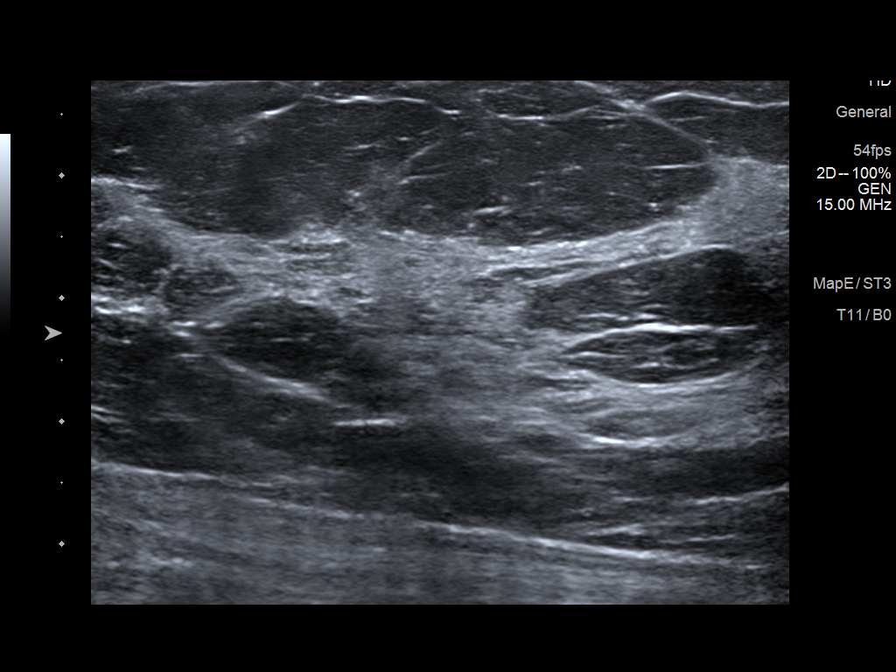
[im 4/7]
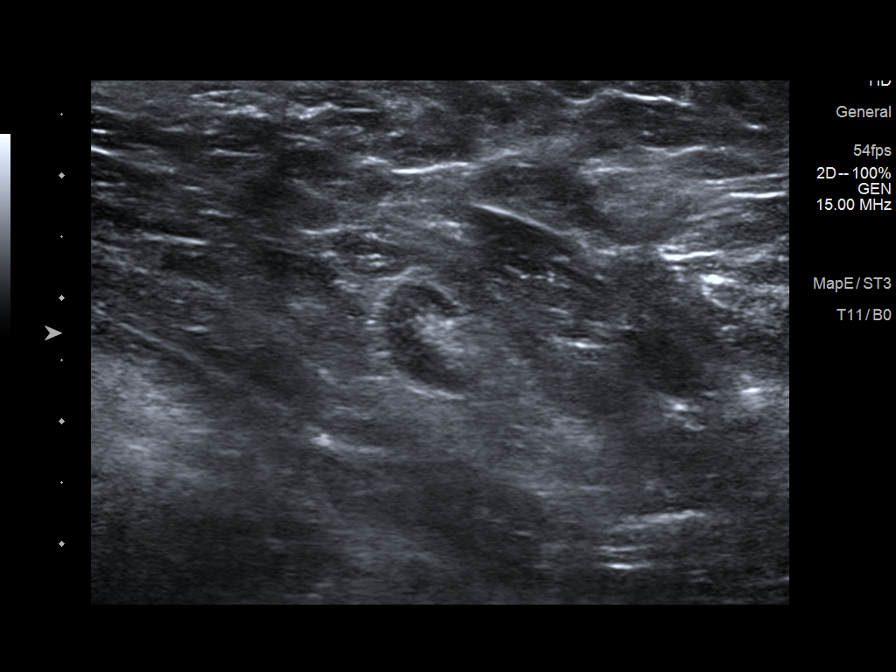
[im 5/7]
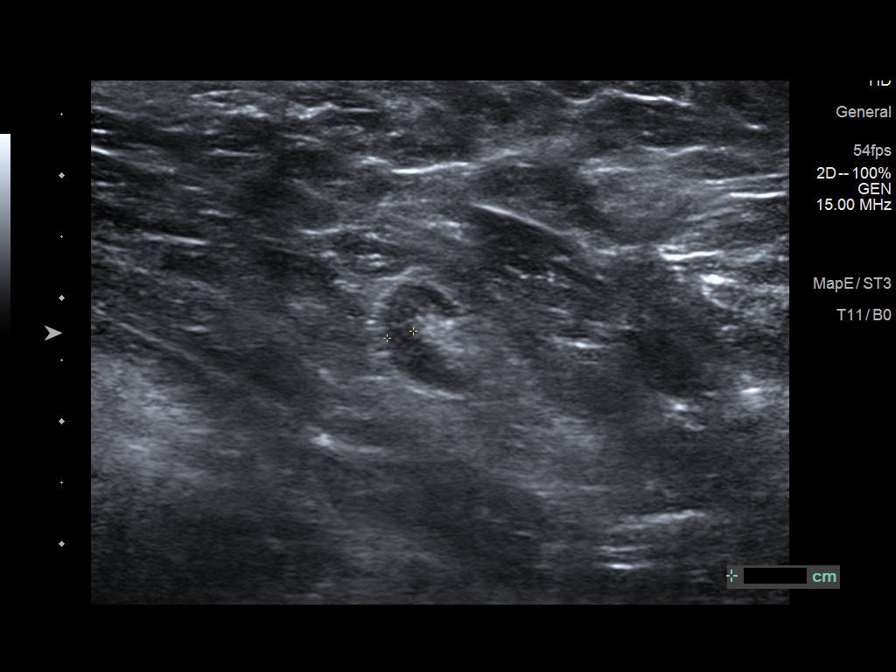
[im 6/7]
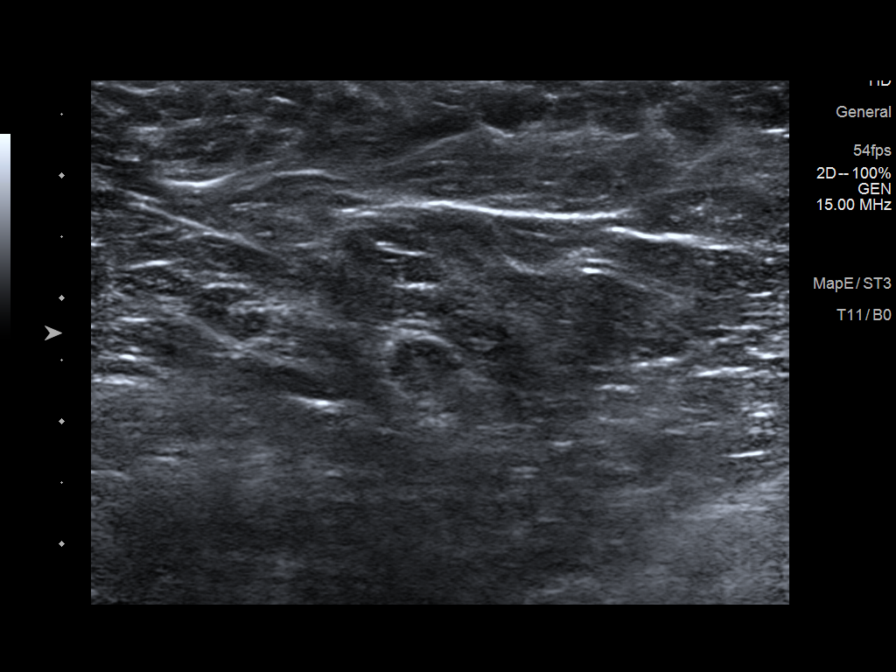
[im 7/7]
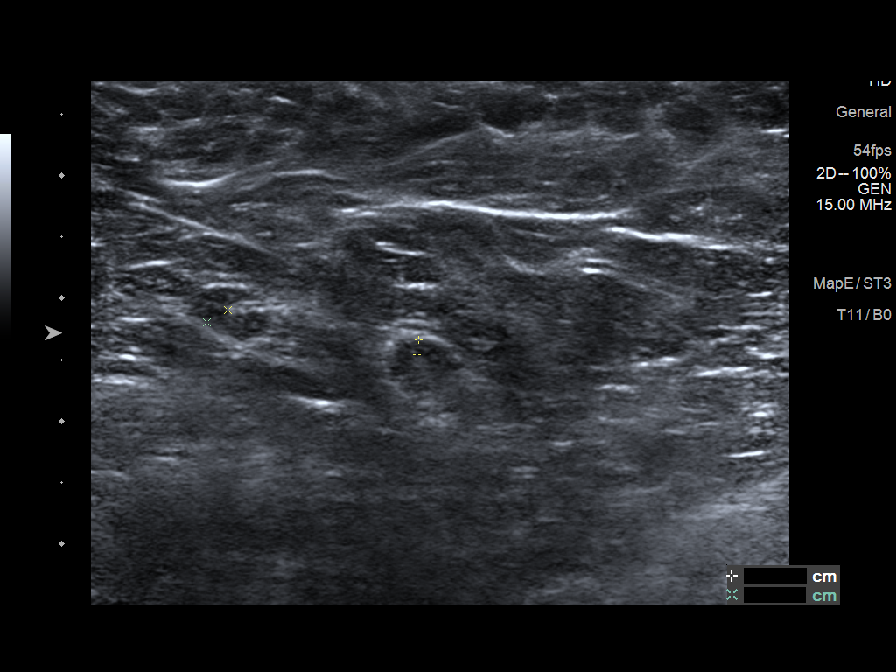

[7 of 7 positions shown; findings below may reference images not displayed]

ACR Breast Density Category b: There are scattered areas of
fibroglandular density.
FINDINGS: The linear and pleomorphic calcifications in the upper outer right
breast extending from posterior to the nipple are not significantly
changed in the interval. No suspicious masses are identified in
either breast.

Targeted ultrasound is performed, showing normal lymph nodes in the
right axilla. The previously identified mass at 10 o'clock in the
right breast is no longer visualized.
IMPRESSION: No interval change. The 10 o'clock right breast mass is no longer
visualized which was also true in [DATE]. The calcifications
in the right breast are stable. The lymph nodes are normal in
appearance. No new sites of malignancy identified.

RECOMMENDATION:
Recommend continued oncologic follow up. Recommend continued
mammography as clinically warranted by the treatment plan. At the
least, annual mammography should be pursued.

I have discussed the findings and recommendations with the patient.
If applicable, a reminder letter will be sent to the patient
regarding the next appointment.

BI-RADS CATEGORY  6: Known biopsy-proven malignancy.

## 2021-06-08 ENCOUNTER — Ambulatory Visit (HOSPITAL_BASED_OUTPATIENT_CLINIC_OR_DEPARTMENT_OTHER)
Admission: RE | Admit: 2021-06-08 | Discharge: 2021-06-08 | Disposition: A | Discharge status: Home / Self Care | Payer: Commercial Managed Care - PPO | Source: Ambulatory Visit | Attending: Cardiology | Admitting: Cardiology

## 2021-06-08 ENCOUNTER — Other Ambulatory Visit: Payer: Self-pay

## 2021-06-08 DIAGNOSIS — R0602 Shortness of breath: Secondary | ICD-10-CM | POA: Insufficient documentation

## 2021-06-08 LAB — ECHOCARDIOGRAM COMPLETE
AR max vel: 2.12 cm2
AV Area VTI: 2.15 cm2
AV Area mean vel: 2.08 cm2
AV Mean grad: 5 mmHg
AV Peak grad: 9.5 mmHg
Ao pk vel: 1.54 m/s
Area-P 1/2: 3.33 cm2
Calc EF: 61.3 %
S' Lateral: 3.2 cm
Single Plane A2C EF: 64.7 %
Single Plane A4C EF: 55.8 %

## 2021-06-11 ENCOUNTER — Encounter: Payer: Self-pay | Admitting: *Deleted

## 2021-08-30 ENCOUNTER — Encounter: Payer: Self-pay | Admitting: Oncology

## 2021-09-28 DEATH — deceased

## 2022-07-02 ENCOUNTER — Other Ambulatory Visit: Payer: Self-pay

## 2022-09-23 ENCOUNTER — Encounter: Payer: Self-pay | Admitting: Oncology

## 2022-09-23 NOTE — Telephone Encounter (Signed)
Done

## 2023-11-15 ENCOUNTER — Other Ambulatory Visit: Payer: Self-pay
